# Patient Record
Sex: Male | Born: 1965 | Race: White | Hispanic: No | Marital: Married | Smoking: Former smoker
Health system: Southern US, Community
[De-identification: ages and names within clinical notes are randomized; demographics above are authoritative.]

## PROBLEM LIST (undated history)

## (undated) DIAGNOSIS — E7800 Pure hypercholesterolemia, unspecified: Secondary | ICD-10-CM

## (undated) DIAGNOSIS — B1920 Unspecified viral hepatitis C without hepatic coma: Secondary | ICD-10-CM

## (undated) DIAGNOSIS — M1990 Unspecified osteoarthritis, unspecified site: Secondary | ICD-10-CM

## (undated) DIAGNOSIS — I219 Acute myocardial infarction, unspecified: Secondary | ICD-10-CM

## (undated) DIAGNOSIS — R9430 Abnormal result of cardiovascular function study, unspecified: Secondary | ICD-10-CM

## (undated) DIAGNOSIS — F32A Depression, unspecified: Secondary | ICD-10-CM

## (undated) DIAGNOSIS — I509 Heart failure, unspecified: Secondary | ICD-10-CM

## (undated) DIAGNOSIS — IMO0002 Reserved for concepts with insufficient information to code with codable children: Secondary | ICD-10-CM

## (undated) DIAGNOSIS — Z87442 Personal history of urinary calculi: Secondary | ICD-10-CM

## (undated) DIAGNOSIS — R911 Solitary pulmonary nodule: Secondary | ICD-10-CM

## (undated) DIAGNOSIS — F1411 Cocaine abuse, in remission: Secondary | ICD-10-CM

## (undated) DIAGNOSIS — I10 Essential (primary) hypertension: Secondary | ICD-10-CM

## (undated) DIAGNOSIS — I779 Disorder of arteries and arterioles, unspecified: Secondary | ICD-10-CM

## (undated) DIAGNOSIS — I2510 Atherosclerotic heart disease of native coronary artery without angina pectoris: Secondary | ICD-10-CM

## (undated) DIAGNOSIS — F1011 Alcohol abuse, in remission: Secondary | ICD-10-CM

## (undated) DIAGNOSIS — K219 Gastro-esophageal reflux disease without esophagitis: Secondary | ICD-10-CM

## (undated) DIAGNOSIS — R42 Dizziness and giddiness: Secondary | ICD-10-CM

## (undated) DIAGNOSIS — E119 Type 2 diabetes mellitus without complications: Secondary | ICD-10-CM

## (undated) DIAGNOSIS — K602 Anal fissure, unspecified: Secondary | ICD-10-CM

## (undated) DIAGNOSIS — J45909 Unspecified asthma, uncomplicated: Secondary | ICD-10-CM

## (undated) DIAGNOSIS — I739 Peripheral vascular disease, unspecified: Secondary | ICD-10-CM

## (undated) DIAGNOSIS — F329 Major depressive disorder, single episode, unspecified: Secondary | ICD-10-CM

## (undated) HISTORY — DX: Dizziness and giddiness: R42

## (undated) HISTORY — DX: Essential (primary) hypertension: I10

## (undated) HISTORY — PX: CARDIAC CATHETERIZATION: SHX172

## (undated) HISTORY — DX: Cocaine abuse, in remission: F14.11

## (undated) HISTORY — DX: Disorder of arteries and arterioles, unspecified: I77.9

## (undated) HISTORY — DX: Solitary pulmonary nodule: R91.1

## (undated) HISTORY — DX: Heart failure, unspecified: I50.9

## (undated) HISTORY — DX: Atherosclerotic heart disease of native coronary artery without angina pectoris: I25.10

## (undated) HISTORY — DX: Alcohol abuse, in remission: F10.11

## (undated) HISTORY — DX: Unspecified viral hepatitis C without hepatic coma: B19.20

## (undated) HISTORY — DX: Pure hypercholesterolemia, unspecified: E78.00

## (undated) HISTORY — DX: Major depressive disorder, single episode, unspecified: F32.9

## (undated) HISTORY — DX: Reserved for concepts with insufficient information to code with codable children: IMO0002

## (undated) HISTORY — DX: Depression, unspecified: F32.A

## (undated) HISTORY — PX: OTHER SURGICAL HISTORY: SHX169

## (undated) HISTORY — DX: Peripheral vascular disease, unspecified: I73.9

## (undated) HISTORY — DX: Abnormal result of cardiovascular function study, unspecified: R94.30

## (undated) HISTORY — DX: Anal fissure, unspecified: K60.2

---

## 2001-11-19 ENCOUNTER — Emergency Department (HOSPITAL_COMMUNITY): Admission: EM | Admit: 2001-11-19 | Discharge: 2001-11-19 | Payer: Self-pay | Admitting: Emergency Medicine

## 2003-02-06 ENCOUNTER — Emergency Department (HOSPITAL_COMMUNITY): Admission: EM | Admit: 2003-02-06 | Discharge: 2003-02-06 | Payer: Self-pay | Admitting: Internal Medicine

## 2004-09-24 ENCOUNTER — Ambulatory Visit: Payer: Self-pay | Admitting: Internal Medicine

## 2004-09-24 ENCOUNTER — Ambulatory Visit (HOSPITAL_COMMUNITY): Admission: RE | Admit: 2004-09-24 | Discharge: 2004-09-24 | Payer: Self-pay | Admitting: Internal Medicine

## 2004-10-08 ENCOUNTER — Ambulatory Visit: Payer: Self-pay | Admitting: Gastroenterology

## 2004-10-08 ENCOUNTER — Ambulatory Visit: Payer: Self-pay | Admitting: Internal Medicine

## 2005-01-01 ENCOUNTER — Ambulatory Visit: Payer: Self-pay | Admitting: Internal Medicine

## 2005-04-16 ENCOUNTER — Ambulatory Visit: Payer: Self-pay | Admitting: Internal Medicine

## 2005-05-05 ENCOUNTER — Ambulatory Visit: Payer: Self-pay | Admitting: Internal Medicine

## 2005-09-21 ENCOUNTER — Ambulatory Visit: Payer: Self-pay | Admitting: Internal Medicine

## 2006-03-22 ENCOUNTER — Ambulatory Visit: Payer: Self-pay | Admitting: Internal Medicine

## 2006-03-31 ENCOUNTER — Ambulatory Visit (HOSPITAL_COMMUNITY): Admission: RE | Admit: 2006-03-31 | Discharge: 2006-03-31 | Payer: Self-pay | Admitting: Internal Medicine

## 2006-04-08 ENCOUNTER — Ambulatory Visit: Payer: Self-pay | Admitting: Gastroenterology

## 2006-04-19 ENCOUNTER — Ambulatory Visit: Payer: Self-pay | Admitting: Internal Medicine

## 2006-08-24 ENCOUNTER — Ambulatory Visit: Payer: Self-pay | Admitting: Internal Medicine

## 2006-08-24 ENCOUNTER — Ambulatory Visit (HOSPITAL_COMMUNITY): Admission: RE | Admit: 2006-08-24 | Discharge: 2006-08-24 | Payer: Self-pay | Admitting: Internal Medicine

## 2006-08-24 ENCOUNTER — Encounter (INDEPENDENT_AMBULATORY_CARE_PROVIDER_SITE_OTHER): Payer: Self-pay | Admitting: Internal Medicine

## 2006-08-24 LAB — CONVERTED CEMR LAB: TSH: 2.61 microintl units/mL

## 2006-09-09 ENCOUNTER — Ambulatory Visit: Payer: Self-pay | Admitting: Gastroenterology

## 2006-09-28 ENCOUNTER — Ambulatory Visit: Payer: Self-pay | Admitting: Internal Medicine

## 2006-10-25 ENCOUNTER — Ambulatory Visit: Payer: Self-pay | Admitting: *Deleted

## 2006-11-01 ENCOUNTER — Ambulatory Visit: Payer: Self-pay | Admitting: Gastroenterology

## 2006-12-15 ENCOUNTER — Ambulatory Visit: Payer: Self-pay | Admitting: Internal Medicine

## 2006-12-15 ENCOUNTER — Encounter (INDEPENDENT_AMBULATORY_CARE_PROVIDER_SITE_OTHER): Payer: Self-pay | Admitting: Internal Medicine

## 2006-12-15 LAB — CONVERTED CEMR LAB
ALT: 65 units/L — ABNORMAL HIGH (ref 0–53)
AST: 31 units/L (ref 0–37)
Albumin: 4.4 g/dL (ref 3.5–5.2)
Alkaline Phosphatase: 60 units/L (ref 39–117)
BUN: 12 mg/dL (ref 6–23)
CO2: 28 meq/L (ref 19–32)
Calcium: 9.6 mg/dL (ref 8.4–10.5)
Chloride: 100 meq/L (ref 96–112)
Cholesterol: 164 mg/dL (ref 0–200)
Creatinine, Ser: 1 mg/dL (ref 0.40–1.50)
Glucose, Bld: 101 mg/dL — ABNORMAL HIGH (ref 70–99)
HDL: 27 mg/dL — ABNORMAL LOW (ref >39)
LDL Cholesterol: 115 mg/dL — ABNORMAL HIGH (ref 0–99)
Potassium: 4.3 meq/L (ref 3.5–5.3)
Sodium: 138 meq/L (ref 135–145)
Total Bilirubin: 0.7 mg/dL (ref 0.3–1.2)
Total CHOL/HDL Ratio: 6.1
Total Protein: 7.4 g/dL (ref 6.0–8.3)
Triglycerides: 112 mg/dL (ref <150)
VLDL: 22 mg/dL (ref 0–40)

## 2006-12-19 ENCOUNTER — Encounter (INDEPENDENT_AMBULATORY_CARE_PROVIDER_SITE_OTHER): Payer: Self-pay | Admitting: Internal Medicine

## 2006-12-19 DIAGNOSIS — I10 Essential (primary) hypertension: Secondary | ICD-10-CM

## 2006-12-19 DIAGNOSIS — F329 Major depressive disorder, single episode, unspecified: Secondary | ICD-10-CM

## 2006-12-19 DIAGNOSIS — F1011 Alcohol abuse, in remission: Secondary | ICD-10-CM

## 2006-12-19 DIAGNOSIS — F1411 Cocaine abuse, in remission: Secondary | ICD-10-CM

## 2006-12-19 DIAGNOSIS — B182 Chronic viral hepatitis C: Secondary | ICD-10-CM

## 2006-12-27 ENCOUNTER — Encounter (INDEPENDENT_AMBULATORY_CARE_PROVIDER_SITE_OTHER): Payer: Self-pay | Admitting: Specialist

## 2006-12-27 ENCOUNTER — Ambulatory Visit (HOSPITAL_COMMUNITY): Admission: RE | Admit: 2006-12-27 | Discharge: 2006-12-27 | Payer: Self-pay | Admitting: Gastroenterology

## 2006-12-28 ENCOUNTER — Telehealth: Payer: Self-pay | Admitting: *Deleted

## 2007-02-21 ENCOUNTER — Ambulatory Visit: Payer: Self-pay | Admitting: Internal Medicine

## 2007-02-21 DIAGNOSIS — R1013 Epigastric pain: Secondary | ICD-10-CM

## 2007-02-21 DIAGNOSIS — F17200 Nicotine dependence, unspecified, uncomplicated: Secondary | ICD-10-CM

## 2007-02-21 DIAGNOSIS — K3189 Other diseases of stomach and duodenum: Secondary | ICD-10-CM

## 2007-04-11 ENCOUNTER — Ambulatory Visit: Payer: Self-pay | Admitting: Gastroenterology

## 2007-04-12 ENCOUNTER — Encounter (INDEPENDENT_AMBULATORY_CARE_PROVIDER_SITE_OTHER): Payer: Self-pay | Admitting: Internal Medicine

## 2007-04-12 ENCOUNTER — Ambulatory Visit: Payer: Self-pay | Admitting: Hospitalist

## 2007-04-12 DIAGNOSIS — R300 Dysuria: Secondary | ICD-10-CM

## 2007-04-12 DIAGNOSIS — L851 Acquired keratosis [keratoderma] palmaris et plantaris: Secondary | ICD-10-CM | POA: Insufficient documentation

## 2007-04-12 LAB — CONVERTED CEMR LAB
Bilirubin Urine: NEGATIVE
Bilirubin Urine: NEGATIVE
Blood in Urine, dipstick: NEGATIVE
Chlamydia, Swab/Urine, PCR: NEGATIVE
GC Probe Amp, Urine: NEGATIVE
Glucose, Urine, Semiquant: NEGATIVE
Hemoglobin, Urine: NEGATIVE
Ketones, ur: NEGATIVE mg/dL
Ketones, urine, test strip: NEGATIVE
Leukocytes, UA: NEGATIVE
Nitrite: NEGATIVE
Nitrite: NEGATIVE
Protein, U semiquant: NEGATIVE
Protein, ur: NEGATIVE mg/dL
Specific Gravity, Urine: <1.005
Specific Gravity, Urine: 1.008 (ref 1.005–1.03)
Urine Glucose: NEGATIVE mg/dL
Urobilinogen, UA: 0.2
Urobilinogen, UA: 0.2 (ref 0.0–1.0)
WBC Urine, dipstick: NEGATIVE
pH: 5
pH: 5.5 (ref 5.0–8.0)

## 2007-04-13 ENCOUNTER — Telehealth (INDEPENDENT_AMBULATORY_CARE_PROVIDER_SITE_OTHER): Payer: Self-pay | Admitting: *Deleted

## 2007-04-19 ENCOUNTER — Encounter (INDEPENDENT_AMBULATORY_CARE_PROVIDER_SITE_OTHER): Payer: Self-pay | Admitting: Internal Medicine

## 2007-04-19 ENCOUNTER — Ambulatory Visit: Payer: Self-pay | Admitting: Internal Medicine

## 2007-04-19 ENCOUNTER — Other Ambulatory Visit: Admission: RE | Admit: 2007-04-19 | Discharge: 2007-04-19 | Payer: Self-pay | Admitting: Internal Medicine

## 2007-04-20 ENCOUNTER — Encounter: Payer: Self-pay | Admitting: Internal Medicine

## 2007-04-21 DIAGNOSIS — B079 Viral wart, unspecified: Secondary | ICD-10-CM | POA: Insufficient documentation

## 2007-04-26 ENCOUNTER — Ambulatory Visit: Payer: Self-pay | Admitting: Internal Medicine

## 2007-04-26 ENCOUNTER — Encounter (INDEPENDENT_AMBULATORY_CARE_PROVIDER_SITE_OTHER): Payer: Self-pay | Admitting: Dermatology

## 2007-05-02 ENCOUNTER — Ambulatory Visit: Payer: Self-pay | Admitting: Gastroenterology

## 2007-07-28 ENCOUNTER — Ambulatory Visit: Payer: Self-pay | Admitting: Hospitalist

## 2007-07-28 ENCOUNTER — Telehealth (INDEPENDENT_AMBULATORY_CARE_PROVIDER_SITE_OTHER): Payer: Self-pay | Admitting: *Deleted

## 2007-07-28 DIAGNOSIS — M25559 Pain in unspecified hip: Secondary | ICD-10-CM

## 2007-08-10 ENCOUNTER — Ambulatory Visit: Payer: Self-pay | Admitting: Gastroenterology

## 2007-08-24 ENCOUNTER — Ambulatory Visit (HOSPITAL_COMMUNITY): Admission: RE | Admit: 2007-08-24 | Discharge: 2007-08-24 | Payer: Self-pay | Admitting: Obstetrics and Gynecology

## 2007-08-24 ENCOUNTER — Ambulatory Visit: Payer: Self-pay | Admitting: Gastroenterology

## 2007-09-01 ENCOUNTER — Encounter: Payer: Self-pay | Admitting: Pulmonary Disease

## 2007-09-01 ENCOUNTER — Ambulatory Visit (HOSPITAL_COMMUNITY): Admission: RE | Admit: 2007-09-01 | Discharge: 2007-09-01 | Payer: Self-pay | Admitting: Gastroenterology

## 2007-09-07 ENCOUNTER — Ambulatory Visit: Payer: Self-pay | Admitting: Gastroenterology

## 2007-09-08 ENCOUNTER — Ambulatory Visit: Payer: Self-pay | Admitting: Pulmonary Disease

## 2007-09-08 DIAGNOSIS — J984 Other disorders of lung: Secondary | ICD-10-CM | POA: Insufficient documentation

## 2007-09-21 ENCOUNTER — Ambulatory Visit: Payer: Self-pay | Admitting: Pulmonary Disease

## 2007-09-21 ENCOUNTER — Ambulatory Visit: Payer: Self-pay | Admitting: Gastroenterology

## 2007-10-02 ENCOUNTER — Ambulatory Visit: Payer: Self-pay | Admitting: Hospitalist

## 2007-10-24 ENCOUNTER — Ambulatory Visit: Payer: Self-pay | Admitting: Gastroenterology

## 2007-11-14 ENCOUNTER — Ambulatory Visit: Payer: Self-pay | Admitting: Gastroenterology

## 2007-11-28 ENCOUNTER — Ambulatory Visit: Payer: Self-pay | Admitting: Pulmonary Disease

## 2007-11-28 DIAGNOSIS — R930 Abnormal findings on diagnostic imaging of skull and head, not elsewhere classified: Secondary | ICD-10-CM

## 2007-12-04 ENCOUNTER — Ambulatory Visit: Payer: Self-pay | Admitting: Gastroenterology

## 2007-12-07 ENCOUNTER — Ambulatory Visit (HOSPITAL_COMMUNITY): Admission: RE | Admit: 2007-12-07 | Discharge: 2007-12-07 | Payer: Self-pay | Admitting: Pulmonary Disease

## 2007-12-07 ENCOUNTER — Encounter: Payer: Self-pay | Admitting: Pulmonary Disease

## 2007-12-14 ENCOUNTER — Ambulatory Visit: Payer: Self-pay | Admitting: Gastroenterology

## 2008-01-04 ENCOUNTER — Ambulatory Visit: Payer: Self-pay | Admitting: Gastroenterology

## 2008-01-10 ENCOUNTER — Ambulatory Visit: Payer: Self-pay | Admitting: Gastroenterology

## 2008-01-10 DIAGNOSIS — K602 Anal fissure, unspecified: Secondary | ICD-10-CM | POA: Insufficient documentation

## 2008-01-11 ENCOUNTER — Ambulatory Visit: Payer: Self-pay | Admitting: Internal Medicine

## 2008-01-11 DIAGNOSIS — E785 Hyperlipidemia, unspecified: Secondary | ICD-10-CM

## 2008-01-11 DIAGNOSIS — H9319 Tinnitus, unspecified ear: Secondary | ICD-10-CM | POA: Insufficient documentation

## 2008-01-18 ENCOUNTER — Ambulatory Visit: Payer: Self-pay | Admitting: Gastroenterology

## 2008-01-18 ENCOUNTER — Encounter: Payer: Self-pay | Admitting: Gastroenterology

## 2008-02-01 ENCOUNTER — Ambulatory Visit: Payer: Self-pay | Admitting: Gastroenterology

## 2008-02-29 ENCOUNTER — Ambulatory Visit: Payer: Self-pay | Admitting: Gastroenterology

## 2008-04-04 ENCOUNTER — Ambulatory Visit: Payer: Self-pay | Admitting: Gastroenterology

## 2008-05-02 ENCOUNTER — Ambulatory Visit: Payer: Self-pay | Admitting: Gastroenterology

## 2008-05-14 ENCOUNTER — Ambulatory Visit: Payer: Self-pay | Admitting: Pulmonary Disease

## 2008-05-30 ENCOUNTER — Ambulatory Visit: Payer: Self-pay | Admitting: Gastroenterology

## 2008-06-27 ENCOUNTER — Ambulatory Visit: Payer: Self-pay | Admitting: Gastroenterology

## 2008-07-21 ENCOUNTER — Emergency Department (HOSPITAL_COMMUNITY): Admission: EM | Admit: 2008-07-21 | Discharge: 2008-07-21 | Payer: Self-pay | Admitting: Emergency Medicine

## 2008-08-01 ENCOUNTER — Ambulatory Visit: Payer: Self-pay | Admitting: Gastroenterology

## 2008-08-13 ENCOUNTER — Ambulatory Visit: Payer: Self-pay | Admitting: Pulmonary Disease

## 2008-08-13 ENCOUNTER — Ambulatory Visit: Payer: Self-pay | Admitting: Cardiology

## 2008-10-03 ENCOUNTER — Ambulatory Visit: Payer: Self-pay | Admitting: Gastroenterology

## 2009-07-31 ENCOUNTER — Ambulatory Visit: Payer: Self-pay | Admitting: Gastroenterology

## 2009-09-16 ENCOUNTER — Ambulatory Visit (HOSPITAL_COMMUNITY): Admission: RE | Admit: 2009-09-16 | Discharge: 2009-09-16 | Payer: Self-pay | Admitting: Optometry

## 2010-10-02 ENCOUNTER — Encounter: Payer: Self-pay | Admitting: Physician Assistant

## 2010-10-18 ENCOUNTER — Inpatient Hospital Stay (HOSPITAL_COMMUNITY)
Admission: EM | Admit: 2010-10-18 | Discharge: 2010-10-22 | Payer: Self-pay | Source: Home / Self Care | Admitting: Emergency Medicine

## 2010-10-18 ENCOUNTER — Ambulatory Visit: Payer: Self-pay | Admitting: Cardiovascular Disease

## 2010-10-18 ENCOUNTER — Encounter: Payer: Self-pay | Admitting: Physician Assistant

## 2010-10-19 ENCOUNTER — Encounter: Payer: Self-pay | Admitting: Cardiovascular Disease

## 2010-10-19 ENCOUNTER — Encounter: Payer: Self-pay | Admitting: Physician Assistant

## 2010-10-20 ENCOUNTER — Encounter: Payer: Self-pay | Admitting: Physician Assistant

## 2010-10-21 ENCOUNTER — Encounter: Payer: Self-pay | Admitting: Physician Assistant

## 2010-11-04 ENCOUNTER — Ambulatory Visit: Payer: Self-pay | Admitting: Physician Assistant

## 2010-11-04 ENCOUNTER — Encounter: Payer: Self-pay | Admitting: Physician Assistant

## 2010-11-11 ENCOUNTER — Ambulatory Visit: Payer: Self-pay | Admitting: Cardiovascular Disease

## 2010-12-02 ENCOUNTER — Ambulatory Visit
Admission: RE | Admit: 2010-12-02 | Discharge: 2010-12-02 | Payer: Self-pay | Source: Home / Self Care | Attending: Internal Medicine | Admitting: Internal Medicine

## 2010-12-24 NOTE — Assessment & Plan Note (Signed)
Summary: POST MI f/u per Dr Myrtis Ser  Medications Added FAMOTIDINE 20 MG TABS (FAMOTIDINE) 1 by mouth two times a day HYDROCORTISONE 2.5 % CREA (HYDROCORTISONE) apply as directed      Allergies Added: NKDA  Visit Type:  Hospital Follow-up Primary Provider:  Hasanaj  CC:  shortness of breath.  History of Present Illness: patient presents for post hospital followup, following recent presentation at [facility] with acute inferior STEMI. He was transferred emergently from [facility] ED, directly to the cardiac catheterization lab, following consultation with Dr. Tonny Bollman.  He was found to have subtotal occlusion of the mid left circumflex artery, successfully treated with a single BMS. Residual anatomy notable for moderate LAD disease, normal RCA, and EF 40-45%, with mild MR.  Of note, LV function was also assessed by cardiac MRI (EF 31%), and 2-D echo (EF 30%). Because of severe post-MI LV dysfunction, a LifeVest was placed.  He reports few fleeting episodes of chest pain, but these pains are not like those at the time of his MI. He complains of shortness of breath with exertion. No edema. No other complaints.  Current Medications (verified): 1)  Budeprion Xl 300 Mg Xr24h-Tab (Bupropion Hcl) .... Take 1 Tablet By Mouth Once A Day 2)  Doxepin Hcl 75 Mg Caps (Doxepin Hcl) .... Take 1 Tablet By Mouth Once A Day 3)  Tylenol 325 Mg Tabs (Acetaminophen) .... Take 2 By Mouth Every 4 Hours As Needed 4)  Lisinopril 10 Mg Tabs (Lisinopril) .... Take 1 Tablet By Mouth Once A Day 5)  Celexa 20 Mg Tabs (Citalopram Hydrobromide) .... Take 1 Tablet By Mouth Once A Day 6)  Abilify 5 Mg Tabs (Aripiprazole) .... Take 1 Tablet By Mouth Once A Day 7)  Clonazepam 0.5 Mg Tabs (Clonazepam) .... Take 1 Tablet By Mouth Three Times A Day 8)  Carvedilol 12.5 Mg Tabs (Carvedilol) .... Take 1 Tablet By Mouth Two Times A Day 9)  Lipitor 80 Mg Tabs (Atorvastatin Calcium) .... Take 1 Tablet By Mouth Once A Day 10)   Nitrostat 0.4 Mg Subl (Nitroglycerin) .... Use As Directed 11)  Aspirin 325 Mg Tabs (Aspirin) .... Take 1 Tablet By Mouth Once A Day 12)  Plavix 75 Mg Tabs (Clopidogrel Bisulfate) .... Take 1 Tablet By Mouth Once A Day 13)  Famotidine 20 Mg Tabs (Famotidine) .Marland Kitchen.. 1 By Mouth Two Times A Day  Allergies (verified): No Known Drug Allergies  Past History:  Past medical history reviewed for relevance to current acute and chronic problems.  Past Medical History: Reviewed history from 01/10/2008 and no changes required. Depression Hepatitis C Hypertension hx/o Cocaine abuse in 80s, ex-IVDU hx/o Alcohol abuse Pulmonary Nodule Anal Fissure  Review of Systems       Negative except as per HPI   Vital Signs:  Patient profile:   45 year old male Height:      69 inches Weight:      230 pounds BMI:     34.09 Pulse rate:   70 / minute Resp:     16 per minute BP sitting:   128 / 87  (right arm)  Vitals Entered By: Marrion Coy, CNA (November 11, 2010 1:42 PM)  Physical Exam  General:  Pt is alert and oriented, obese male, in no acute distress. HEENT: normal Neck: normal carotid upstrokes without bruits, JVP normal Lungs: CTA CV: RRR without murmur or gallop Chest: rash at site of LifeVest contact on anterior and posterior chest Abd: soft, NT, positive BS, no  bruit, no organomegaly Ext: no clubbing, cyanosis, or edema. peripheral pulses 2+ and equal Skin: warm and dry without rash    Impression & Recommendations:  Problem # 1:  MYOCARDIAL INFARCTION, INFEROPOSTERIOR WALL, INITIAL EPISODE (ICD-410.31) Pt is s/p MI secondary to left circumflex occlusion and treatment with primary PCI (bare metal stent). A LifeVest was placed because his post-MI LVEF is 30% (by both echo and cardiac MR). The MRI suggested that the degree of LV dysfunction is out-of-proportion to infarct size. 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 He has an appt with  Dr Johney Frame Jan 11th for consideration of an ICD. He will need reassessment of LV function in the near future and I'll leave the timing of this to Dr Johney Frame.  He is on appropriate medical therapy and will continue this without changes today. Ongoing followup will be with Dr Myrtis Ser in the [place] office.  His updated medication list for this problem includes:    Lisinopril 10 Mg Tabs (Lisinopril) .Marland Kitchen... Take 1 tablet by mouth once a day    Carvedilol 12.5 Mg Tabs (Carvedilol) .Marland Kitchen... Take 1 tablet by mouth two times a day    Nitrostat 0.4 Mg Subl (Nitroglycerin) ..... Use as directed    Aspirin 325 Mg Tabs (Aspirin) .Marland Kitchen... Take 1 tablet by mouth once a day    Plavix 75 Mg Tabs (Clopidogrel bisulfate) .Marland Kitchen... Take 1 tablet by mouth once a day  Other Orders: EKG w/ Interpretation (93000)  Patient Instructions: 1)  Your physician recommends that you keep your scheduled follow-up appointment with Dr Johney Frame. 2)  Your physician recommends that you continue on your current medications as directed. Please refer to the Current Medication list given to you today. 3)  Please use 2.5% Hydrocortisone cream and 100% corn starch on rash.  Prescriptions: HYDROCORTISONE 2.5 % CREA (HYDROCORTISONE) apply as directed  #1 x 1   Entered by:   Julieta Gutting, RN, BSN   Authorized by:   Norva Karvonen, MD   Signed by:   Julieta Gutting, RN, BSN on 11/11/2010   Method used:   Electronically to        [facility] (retail)       [address]       [place], [place]  [address]       Ph: [phone]       Fax: [phone]   RxID:   [id number]

## 2010-12-24 NOTE — Assessment & Plan Note (Signed)
Summary: eph-post cone      Allergies Added: NKDA  Visit Type:  Follow-up Primary Provider:  Hasanaj   History of Present Illness: patient presents for post hospital followup, following recent presentation at [facility] with acute inferior STEMI. He was transferred emergently from [facility] ED, directly to the cardiac catheterization lab, following consultation with Dr. Tonny Bollman.  He was found to have subtotal occlusion of the mid left circumflex artery, successfully treated with a single BMS. Residual anatomy notable for moderate LAD disease, normal RCA, and EF 40-45%, with mild MR.  Records indicate that he was treated for tachycardia, and was subsequently placed on a LifeVest. Beta blocker was added. Electrolytes were within normal limits. He is due for a repeat 2-D echo, in 2 months.  Of note, LV function was also assessed by cardiac MRI (EF 31%), and 2-D echo (EF 30%).  Clinically, he denies recurrent chest and left bicep pain. He has since stopped smoking tobacco. He complains of a non pruritic rash, beneath his anterior LifeVest strap.  Preventive Screening-Counseling & Management  Alcohol-Tobacco     Smoking Status: quit     Year Quit: 10/18/10  Current Medications (verified): 1)  Budeprion Xl 300 Mg Xr24h-Tab (Bupropion Hcl) .... Take 1 Tablet By Mouth Once A Day 2)  Doxepin Hcl 75 Mg Caps (Doxepin Hcl) .... Take 1 Tablet By Mouth Once A Day 3)  Tylenol 325 Mg Tabs (Acetaminophen) .... Take 2 By Mouth Every 4 Hours As Needed 4)  Lisinopril 10 Mg Tabs (Lisinopril) .... Take 1 Tablet By Mouth Once A Day 5)  Celexa 20 Mg Tabs (Citalopram Hydrobromide) .... Take 1 Tablet By Mouth Once A Day 6)  Abilify 5 Mg Tabs (Aripiprazole) .... Take 1 Tablet By Mouth Once A Day 7)  Clonazepam 0.5 Mg Tabs (Clonazepam) .... Take 1 Tablet By Mouth Three Times A Day 8)  Carvedilol 12.5 Mg Tabs (Carvedilol) .... Take 1 Tablet By Mouth Two Times A Day 9)  Lipitor 80 Mg Tabs (Atorvastatin Calcium)  .... Take 1 Tablet By Mouth Once A Day 10)  Nitrostat 0.4 Mg Subl (Nitroglycerin) .... Use As Directed 11)  Aspirin 325 Mg Tabs (Aspirin) .... Take 1 Tablet By Mouth Once A Day 12)  Plavix 75 Mg Tabs (Clopidogrel Bisulfate) .... Take 1 Tablet By Mouth Once A Day  Allergies (verified): No Known Drug Allergies  Comments:  Nurse/Medical Assistant: The patient's medication list and allergies were reviewed with the patient and were updated in the Medication and Allergy Lists.  Past History:  Past Medical History: Last updated: 01/10/2008 Depression Hepatitis C Hypertension hx/o Cocaine abuse in 80s, ex-IVDU hx/o Alcohol abuse Pulmonary Nodule Anal Fissure  Social History: Smoking Status:  quit  Review of Systems       No fevers, chills, hemoptysis, dysphagia, melena, hematocheezia, hematuria, rash, claudication, orthopnea, pnd, pedal edema. denies tachycardia palpitations, or defibrillator shock. All other systems negative.   Vital Signs:  Patient profile:   45 year old male Height:      69 inches Weight:      232 pounds BMI:     34.38 Pulse rate:   73 / minute BP sitting:   118 / 84  (left arm) Cuff size:   large  Vitals Entered By: Carlye Grippe (November 04, 2010 1:32 PM)  Nutrition Counseling: Patient's BMI is greater than 25 and therefore counseled on weight management options.   Physical Exam  Additional Exam:  GEN: 80 -year-old male, obese, no distress HEENT:  NCAT,PERRLA,EOMI NECK: palpable pulses, no bruits; no JVD; no TM LUNGS: CTA bilaterally HEART: RRR (S1S2); no significant murmurs; no rubs; no gallops ABD: protuberant; intact BS EXT: stable right wrist incision site, with no hematoma or ecchymosis; no peripheral edema SKIN: small area of petechial rash, underlying strap MUSC: no obvious deformity NEURO: A/O (x3)     Impression & Recommendations:  Problem # 1:  CAD (ICD-414.00)  patient doing well, following recent presentation with acute  myocardial infarction. He has not had any recurrent chest or left arm pain. He has since stopped smoking tobacco, and reports compliance with his medications. Of note, patient is wearing a LifeVest, and is scheduled for a followup 2-D echo in approximately 2 months, for reassessment of LV function, in our [place] office. I discussed his recent presentation with Dr. Willa Rough, with whom patient will establish here in [place], with particular emphasis on his LifeVest. Dr. Henrietta Hoover recommendation is that patient be initially seen in early followup by Dr. Tonny Bollman, as well as by our EP team regarding duration and further recommendations with respect to his LifeVest. Of note, patient is on a beta blocker. Following stabilization and resolution of these issues, we'll have patient return to our [facility] clinic, for follow up with Dr. Myrtis Ser.  Problem # 2:  DYSLIPIDEMIA (ICD-272.4)  patient recently placed on high-dose Lipitor. Will assess lipid status with repeat profile in 12 weeks.  Problem # 3:  TOBACCO USE (ICD-305.1)  the patient has since quit smoking.  Problem # 4:  HYPERTENSION (ICD-401.9)  stable on current medication regimen.  Problem # 5:  elevated TSH  Will repeat TSH level, recently assessed as 8.2. Further workup and management will be deferred to Dr. Olena Leatherwood. Will check a followup metabolic profile at that time, as well.  Other Orders: EKG w/ Interpretation (93000) T-Basic Metabolic Panel ([address]) T-TSH [phone])  Patient Instructions: 1)  Follow up with Dr. Myrtis Ser on Monday, January 24, 2010 at 1pm. 2)  We will be in touch with your appt information to see Dr. Excell Seltzer and EP in [place].

## 2010-12-24 NOTE — Assessment & Plan Note (Signed)
Summary: nep/pt has a life vest      Allergies Added: NKDA  Visit Type:  Initial Consult Referring Provider:  Dr Excell Seltzer Primary Provider:  Olena Leatherwood   History of Present Illness: Mr Angel Donovan is a pleasant 45 yo WM with a h/o CAD s/p recent inferior MI Oct 30, 2010 who presents today for further risk stratification of sudden death.    On 2010/10/30, he was transferred emergently from [facility] ED, directly to the [facility] cardiac catheterization lab.  He was found to have subtotal occlusion of the mid left circumflex artery which was successfully treated with a single BMS. Residual anatomy was notable for moderate LAD disease, normal RCA, and EF 40-45%, with mild MR.  His  LV function was also assessed by cardiac MRI (EF 31%), and 2-D echo (EF 30%). Because of severe post-MI LV dysfunction, a LifeVest was placed. The patient reports doing very well since that time.  He has been compliant with medicine therapy.  He has also quit smoking.  He has not been very active and has not started cardiac rehab. He denies further chest pain but reports SOB with moderate activity. He denies orthopnea, PND, lower extremity edema, dizziness, presyncope, syncope, or neurologic sequela. The patient is tolerating medications without difficulties and is otherwise without complaint today.   He reports compliance with his lifevest and has not received shock therapy.  Current Medications (verified): 1)  Budeprion Xl 300 Mg Xr24h-Tab (Bupropion Hcl) .... Take 1 Tablet By Mouth Once A Day 2)  Doxepin Hcl 75 Mg Caps (Doxepin Hcl) .... Take 1 Tablet By Mouth Once A Day 3)  Tylenol 325 Mg Tabs (Acetaminophen) .... Take 2 By Mouth Every 4 Hours As Needed 4)  Lisinopril 10 Mg Tabs (Lisinopril) .... Take 1 Tablet By Mouth Once A Day 5)  Celexa 20 Mg Tabs (Citalopram Hydrobromide) .... Take 1 Tablet By Mouth Once A Day 6)  Abilify 5 Mg Tabs (Aripiprazole) .... Take 1 Tablet By Mouth Once A Day 7)  Clonazepam 0.5 Mg Tabs (Clonazepam)  .... Take 1 Tablet By Mouth Three Times A Day 8)  Carvedilol 12.5 Mg Tabs (Carvedilol) .... Take 1 Tablet By Mouth Two Times A Day 9)  Lipitor 80 Mg Tabs (Atorvastatin Calcium) .... Take 1 Tablet By Mouth Once A Day 10)  Nitrostat 0.4 Mg Subl (Nitroglycerin) .... Use As Directed 11)  Aspirin 325 Mg Tabs (Aspirin) .... Take 1 Tablet By Mouth Once A Day 12)  Plavix 75 Mg Tabs (Clopidogrel Bisulfate) .... Take 1 Tablet By Mouth Once A Day 13)  Famotidine 20 Mg Tabs (Famotidine) .Marland Kitchen.. 1 By Mouth Two Times A Day 14)  Hydrocortisone 2.5 % Crea (Hydrocortisone) .... Apply As Directed  Allergies (verified): No Known Drug Allergies  Past History:  Past Medical History: CAD s/p acure inferior MI 10-30-10 requiring PCI of the RCA Ischemic CM (EF 30%) NYHA Class II/III CHF Depression Hepatitis C Hypertension hx/o Cocaine abuse in 80s, ex-IVDU hx/o Alcohol abuse Pulmonary Nodule Anal Fissure  Past Surgical History: cath with PCI RCA 10/19/10  Family History: Reviewed history from 12/19/2006 and no changes required. Family History Diabetes 1st degree relative Family History of CAD Male 1st degree relative <50,and repeat MI in May40 Family History Other cancer-Lymphoma Father  Social History: Pt lives in [place].  He smoked previously but quit with his MI 11/11. Alcohol- quit denies drug use Married  Review of Systems       All systems are reviewed and negative except as listed  in the HPI.   Vital Signs:  Patient profile:   45 year old male Height:      69 inches Weight:      234 pounds BMI:     34.68 Pulse rate:   72 / minute BP sitting:   122 / 78  (left arm)  Vitals Entered By: Laurance Flatten CMA (December 02, 2010 12:11 PM)  Physical Exam  General:  Well developed, well nourished, in no acute distress.  wearing lifevest today Head:  normocephalic and atraumatic Eyes:  PERRLA/EOM intact; conjunctiva and lids normal. Mouth:  Teeth, gums and palate normal. Oral mucosa  normal. Neck:  Neck supple, no JVD. No masses, thyromegaly or abnormal cervical nodes. Lungs:  Clear bilaterally to auscultation and percussion. Heart:  normal rate, regular rhythm, no murmur, no gallop, and no rub.   Abdomen:  Bowel sounds positive; abdomen soft and non-tender without masses, organomegaly, or hernias noted. No hepatosplenomegaly. Msk:  Back normal, normal gait. Muscle strength and tone normal. Extremities:  No clubbing or cyanosis. Neurologic:  Alert and oriented x 3. Skin:  Intact without lesions or rashes. Psych:  Normal affect.  Cardiac Cath  Procedure date:  10/18/2010  Findings:       FINAL ASSESSMENT:   1. Severe mid-left circumflex stenosis with successful primary       percutaneous coronary intervention using a single bare metal stent.   2. Moderate left anterior descending coronary artery stenosis.   3. Normal right coronary artery.   4. Moderate segmental LV systolic dysfunction with estimated LVEF of       40-45% with mild mitral regurgitation.      Echocardiogram  Procedure date:  10/19/2010  Findings:        Study Conclusions   - Left ventricle: Very poor acoustic windows make evaluation    difficult. OVerall LVEF is depressed at approximately 30% with    severe hypokinesis of the inferior, posterior anterior,    anterolateral walls. Again, exam limited as endocardium is very  diffkuctl to see. Suggest repeat study when patient is more mobile, can be done in the echo lab. The cavity size was normal.    Wall thickness was increased in a pattern of mild LVH.  - Right ventricle: Systolic function was mildly reduced.  Transthoracic echocardiography. M-mode, complete 2D, spectral  Doppler, and color Doppler. Height: Height: 177cm. Height: 69.7in.  Weight: Weight: 102kg. Weight: 224.4lb. Body mass index: BMI:  32.6kg/m^2. Body surface area: BSA: 2.25m^2. Blood pressure: 121/81.  Patient status: Inpatient. Location: ICU/CCU Left ventricle: Very poor acoustic  windows make evaluation  difficult. OVerall LVEF is depressed at approximately 30% with  severe hypokinesis of the inferior, posterior anterior,  anterolateral walls. Again, exam limited as endocardium is very  diffkuctl to see. Suggest repeat study when patient is more mobile,  can be done in the echo lab. The cavity size was normal. Wall  thickness was increased in a pattern of mild LVH.  Aortic valve: Structurally normal valve. Cusp separation was normal.  Doppler: Transvalvular velocity was within the normal range. There  was no stenosis. No regurgitation.  Left atrium: The atrium was normal in size. Right ventricle: The cavity size was normal. Systolic function was  mildly reduced. Pulmonic valve: Structurally normal valve. Cusp separation was  normal. Doppler: Transvalvular velocity was within the normal range.  No regurgitation.  Tricuspid valve: Structurally normal valve. Leaflet separation was  normal. Doppler: Transvalvular velocity was within the normal range.  Trivial regurgitation.  Right atrium: The atrium was normal in size.  Pericardium: There was no pericardial effusion.  MRI EXAM  Procedure date:  10/20/2010  Findings:       Cardiac MRI:    Indication: Ischemic DCM ? EF    Protocol:  The patient was scanned on a GE 1.5 Tesla magnet.   Functional imaging was done using Fiesta sequences.  2,3 and 4   chamber views were done to assess RWMA's.  The patient received 50   cc of Magnevist.  After 10 minutes inversion recovery sequences   were done to assess viablity.  Quantitative EF was calculated using   mass analysis software on a GE ADAC work-station    Findings:  There was mild to moderate LVE.  There was moderate LVH   with septal thickness of 14 mm.  There was diffuse hypokinesis   worse in the inferobasal and basal lateral wall.  There was   significant dysynergy between the lateral and septal wall.  The   inferobase was thinned.  There was a 2/3rd thickness scar involving    the inferior base    Quantitative EF was 31% ( EDV 97cc, ESV 67cc SV 30 cc )    Impression:       1)    Mild to moderate LVE with moderate LVH.  Diffuse hypokinesis         worse in the inferobasal and basal lateral walls.  Significant         dysynergy between septum and lateral walls.  EF 31%    2)    Inferobasal scar with EF disporportionately low compared to   infarct size    Read By:  Wendall Stade,  M.D.   CXR  Procedure date:  10/19/2010  Findings:        Clinical Data: Post catheterization.    PORTABLE CHEST - 1 VIEW    Comparison: CT of 09/16/2009.  Most recent plain film of   08/24/2007.    Findings: Midline trachea.  Normal heart size and mediastinal   contours. No pleural effusion or pneumothorax.  Clear lungs.    IMPRESSION:   No acute cardiopulmonary disease.   Impression & Recommendations:  Problem # 1:  CHRONIC SYSTOLIC HEART FAILURE (ICD-428.22) The patient has an ischemic CM (EF 30), NYHA Class II/III CHF, and CAD.  He is s/p emergent PCI of the RCI following inferior STEMI 10/18/10.  He is now on an optimal medical regimen.  Per guidelines, we will continue medical therapy and then reassess his ejection fraction with echo 90 days post revascularization.  If his EF remains <35% at that time, we will offer ICD.  If his EF is >35%,  then we would not offer ICD and would take off the lifevest at that time.  The patient is amenable to this plan. I will see him back in the office early March at which time we will repeat his echo.  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) no further symptoms of ischemi continue medical therapy as above  Problem # 3:  TOBACCO USE (ICD-305.1) pt encouraged in his cessation  Problem # 4:  HYPERTENSION (ICD-401.9) stable  Other Orders: Cardiac Rehabilitation (Cardiac Rehab) Echocardiogram (Echo)  Patient Instructions: 1)  Your physician recommends that you schedule a  follow-up appointment in: First date in March that Dr Johney Frame is here 2)  Your physician has requested that you have an echocardiogram.  Echocardiography is a painless test  that uses sound waves to create images of your heart. It provides your doctor with information about the size and shape of your heart and how well your heart's chambers and valves are working.  This procedure takes approximately one hour. There are no restrictions for this procedure.

## 2011-01-25 ENCOUNTER — Encounter: Payer: Self-pay | Admitting: Internal Medicine

## 2011-01-25 ENCOUNTER — Ambulatory Visit (INDEPENDENT_AMBULATORY_CARE_PROVIDER_SITE_OTHER): Payer: Medicare Other | Admitting: Internal Medicine

## 2011-01-25 ENCOUNTER — Ambulatory Visit (HOSPITAL_COMMUNITY): Payer: Medicare Other | Attending: Cardiovascular Disease

## 2011-01-25 DIAGNOSIS — I2589 Other forms of chronic ischemic heart disease: Secondary | ICD-10-CM

## 2011-01-25 DIAGNOSIS — I5022 Chronic systolic (congestive) heart failure: Secondary | ICD-10-CM

## 2011-01-25 DIAGNOSIS — F1410 Cocaine abuse, uncomplicated: Secondary | ICD-10-CM | POA: Insufficient documentation

## 2011-01-25 DIAGNOSIS — I2510 Atherosclerotic heart disease of native coronary artery without angina pectoris: Secondary | ICD-10-CM

## 2011-01-25 DIAGNOSIS — F1010 Alcohol abuse, uncomplicated: Secondary | ICD-10-CM | POA: Insufficient documentation

## 2011-01-25 DIAGNOSIS — B1920 Unspecified viral hepatitis C without hepatic coma: Secondary | ICD-10-CM | POA: Insufficient documentation

## 2011-02-02 LAB — BASIC METABOLIC PANEL
BUN: 11 mg/dL (ref 6–23)
BUN: 7 mg/dL (ref 6–23)
CO2: 24 mEq/L (ref 19–32)
CO2: 25 mEq/L (ref 19–32)
CO2: 30 mEq/L (ref 19–32)
Chloride: 103 mEq/L (ref 96–112)
Chloride: 103 mEq/L (ref 96–112)
Chloride: 105 mEq/L (ref 96–112)
Creatinine, Ser: 1.04 mg/dL (ref 0.4–1.5)
Creatinine, Ser: 1.06 mg/dL (ref 0.4–1.5)
Creatinine, Ser: 1.1 mg/dL (ref 0.4–1.5)
GFR calc Af Amer: >60 mL/min (ref >60)
Glucose, Bld: 105 mg/dL — ABNORMAL HIGH (ref 70–99)
Glucose, Bld: 98 mg/dL (ref 70–99)
Potassium: 4.1 mEq/L (ref 3.5–5.1)
Potassium: 4.1 mEq/L (ref 3.5–5.1)

## 2011-02-02 LAB — CBC
HCT: 45.9 % (ref 39.0–52.0)
HCT: 50.7 % (ref 39.0–52.0)
Hemoglobin: 15.7 g/dL (ref 13.0–17.0)
MCH: 31.4 pg (ref 26.0–34.0)
MCH: 31.5 pg (ref 26.0–34.0)
MCH: 32 pg (ref 26.0–34.0)
MCHC: 34.2 g/dL (ref 30.0–36.0)
MCV: 92 fL (ref 78.0–100.0)
MCV: 93 fL (ref 78.0–100.0)
MCV: 93.1 fL (ref 78.0–100.0)
Platelets: 178 10*3/uL (ref 150–400)
Platelets: 184 10*3/uL (ref 150–400)
Platelets: 204 10*3/uL (ref 150–400)
RBC: 4.99 MIL/uL (ref 4.22–5.81)
RBC: 5.45 MIL/uL (ref 4.22–5.81)
RDW: 13 % (ref 11.5–15.5)
RDW: 13.3 % (ref 11.5–15.5)
WBC: 10.9 10*3/uL — ABNORMAL HIGH (ref 4.0–10.5)

## 2011-02-02 LAB — COMPREHENSIVE METABOLIC PANEL
ALT: 43 U/L (ref 0–53)
AST: 42 U/L — ABNORMAL HIGH (ref 0–37)
Albumin: 3.5 g/dL (ref 3.5–5.2)
Alkaline Phosphatase: 42 U/L (ref 39–117)
BUN: 7 mg/dL (ref 6–23)
CO2: 25 mEq/L (ref 19–32)
Calcium: 8.4 mg/dL (ref 8.4–10.5)
Chloride: 105 mEq/L (ref 96–112)
Creatinine, Ser: 0.97 mg/dL (ref 0.4–1.5)
GFR calc Af Amer: >60 mL/min (ref >60)
GFR calc non Af Amer: >60 mL/min (ref >60)
Glucose, Bld: 113 mg/dL — ABNORMAL HIGH (ref 70–99)
Potassium: 3.8 mEq/L (ref 3.5–5.1)
Sodium: 135 mEq/L (ref 135–145)
Total Bilirubin: 0.7 mg/dL (ref 0.3–1.2)
Total Protein: 6.1 g/dL (ref 6.0–8.3)

## 2011-02-02 LAB — POCT I-STAT, CHEM 8
BUN: 7 mg/dL (ref 6–23)
Calcium, Ion: 1.22 mmol/L (ref 1.12–1.32)
Chloride: 103 mEq/L (ref 96–112)
Creatinine, Ser: 1.1 mg/dL (ref 0.4–1.5)
Glucose, Bld: 115 mg/dL — ABNORMAL HIGH (ref 70–99)
HCT: 49 % (ref 39.0–52.0)
Hemoglobin: 16.7 g/dL (ref 13.0–17.0)
Potassium: 4 mEq/L (ref 3.5–5.1)
Sodium: 140 mEq/L (ref 135–145)
TCO2: 24 mmol/L (ref 0–100)

## 2011-02-02 LAB — LIPID PANEL
HDL: 22 mg/dL — ABNORMAL LOW (ref >39)
Total CHOL/HDL Ratio: 7.6 RATIO
Triglycerides: 314 mg/dL — ABNORMAL HIGH (ref <150)

## 2011-02-02 LAB — CARDIAC PANEL(CRET KIN+CKTOT+MB+TROPI)
CK, MB: 31.3 ng/mL (ref 0.3–4.0)
Relative Index: 7 — ABNORMAL HIGH (ref 0.0–2.5)
Total CK: 447 U/L — ABNORMAL HIGH (ref 7–232)
Troponin I: 1.24 ng/mL (ref 0.00–0.06)

## 2011-02-02 LAB — PROTIME-INR
INR: 8.8 (ref 0.00–1.49)
Prothrombin Time: 71.4 seconds — ABNORMAL HIGH (ref 11.6–15.2)

## 2011-02-02 LAB — TROPONIN I: Troponin I: 35.94 ng/mL (ref 0.00–0.06)

## 2011-02-02 LAB — BRAIN NATRIURETIC PEPTIDE: Pro B Natriuretic peptide (BNP): 53 pg/mL (ref 0.0–100.0)

## 2011-02-02 LAB — TSH: TSH: 8.185 u[IU]/mL — ABNORMAL HIGH (ref 0.350–4.500)

## 2011-02-02 NOTE — Assessment & Plan Note (Signed)
Summary: FOLLOW UP/ECHO AT 930/SL/KL  Medications Added DIAZEPAM 5 MG TABS (DIAZEPAM) three times a day      Allergies Added: NKDA  Visit Type:  Follow-up Referring Provider:  Dr Excell Seltzer Primary Provider:  Olena Leatherwood   History of Present Illness: The patient presents today for routine electrophysiology followup. He reports doing very well since last being seen in our clinic.  He has been quite active.  He reports no episodes of chest pain.  He has minimal SOB.   The patient denies symptoms of palpitations,  orthopnea, PND, lower extremity edema, dizziness, presyncope, syncope, or neurologic sequela. The patient is tolerating medications without difficulties and is otherwise without complaint today.   Current Medications (verified): 1)  Budeprion Xl 300 Mg Xr24h-Tab (Bupropion Hcl) .... Take 1 Tablet By Mouth Once A Day 2)  Doxepin Hcl 75 Mg Caps (Doxepin Hcl) .... Take 1 Tablet By Mouth Once A Day 3)  Lisinopril 10 Mg Tabs (Lisinopril) .... Take 1 Tablet By Mouth Once A Day 4)  Celexa 20 Mg Tabs (Citalopram Hydrobromide) .... Take 1 Tablet By Mouth Once A Day 5)  Abilify 5 Mg Tabs (Aripiprazole) .... Take 1 Tablet By Mouth Once A Day 6)  Clonazepam 0.5 Mg Tabs (Clonazepam) .... Take 1 Tablet By Mouth Three Times A Day 7)  Carvedilol 12.5 Mg Tabs (Carvedilol) .... Take 1 Tablet By Mouth Two Times A Day 8)  Lipitor 80 Mg Tabs (Atorvastatin Calcium) .... Take 1 Tablet By Mouth Once A Day 9)  Nitrostat 0.4 Mg Subl (Nitroglycerin) .... Use As Directed 10)  Aspirin 325 Mg Tabs (Aspirin) .... Take 1 Tablet By Mouth Once A Day 11)  Plavix 75 Mg Tabs (Clopidogrel Bisulfate) .... Take 1 Tablet By Mouth Once A Day 12)  Famotidine 20 Mg Tabs (Famotidine) .Marland Kitchen.. 1 By Mouth Two Times A Day 13)  Hydrocortisone 2.5 % Crea (Hydrocortisone) .... Apply As Directed 14)  Diazepam 5 Mg Tabs (Diazepam) .... Three Times A Day  Allergies (verified): No Known Drug Allergies  Past History:  Past Medical  History: CAD s/p acure inferior MI 10/18/10 requiring PCI of the RCA Ischemic CM (EF 40%) NYHA Class II/III CHF Depression Hepatitis C Hypertension hx/o Cocaine abuse in 80s, ex-IVDU hx/o Alcohol abuse Pulmonary Nodule Anal Fissure  Past Surgical History: Reviewed history from 12/02/2010 and no changes required. cath with PCI RCA 10/19/10  Social History: Reviewed history from 12/02/2010 and no changes required. Pt lives in [place].  He smoked previously but quit with his MI 11/11. Alcohol- quit denies drug use Married  Review of Systems       All systems are reviewed and negative except as listed in the HPI.   Vital Signs:  Patient profile:   45 year old male Height:      69 inches Weight:      228 pounds BMI:     33.79 Pulse rate:   57 / minute BP sitting:   110 / 88  (right arm)  Vitals Entered By: Laurance Flatten CMA (January 25, 2011 10:40 AM)  Physical Exam  General:  Well developed, well nourished, in no acute distress. Head:  normocephalic and atraumatic Eyes:  PERRLA/EOM intact; conjunctiva and lids normal. Mouth:  Teeth, gums and palate normal. Oral mucosa normal. Neck:  supple Lungs:  Clear bilaterally to auscultation and percussion. Heart:  normal rate, regular rhythm, no murmur, no gallop, and no rub.   Abdomen:  Bowel sounds positive; abdomen soft and non-tender  without masses, organomegaly, or hernias noted. No hepatosplenomegaly. Msk:  Back normal, normal gait. Muscle strength and tone normal. Extremities:  No clubbing or cyanosis. Neurologic:  Alert and oriented x 3. Skin:  Intact without lesions or rashes.   EKG  Procedure date:  01/25/2011  Findings:      sinus bradycardia 57 bpm, inferior infarction, otherwise normal ekg  Impression & Recommendations:  Problem # 1:  CHRONIC SYSTOLIC HEART FAILURE (ICD-428.22) doing well with medical therapy for CHF, presently NYHA Class II I reviewed the patient's echo in detail with Dr Tenny Craw today.  She  feels that when compared to the patient's prior echo, his EF has clearly improved (now 40%).  She does not feel that repeat cardiac MRI is necessary at this time.  We will therefore continue medical therapy for CHF longterm.  As his EF has improved, I will discontinue his lifevest at this time. He does not meet criteria for ICD implantation.  He will continue to follow closely with Dr Myrtis Ser.  I will see him again as needed if his EF falls below 35% or if other EP issues arise.  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) stable without symptoms of ischemia  Problem # 3:  TOBACCO USE (ICD-305.1) he is no longer smoking  Problem # 4:  HYPERTENSION (ICD-401.9) stable  Patient Instructions: 1)  Your physician recommends that you schedule a follow-up appointment in: 3 months with Dr Excell Seltzer 2)  okay to d/c Lifevest  Prevention & Chronic Care Immunizations   Influenza vaccine: Fluvax Non-MCR  (10/02/2007)    Tetanus booster: Not documented    Pneumococcal vaccine: Not documented  Other Screening   Smoking status: quit  (11/04/2010)  Lipids   Total Cholesterol: 164  (12/15/2006)   LDL: 115  (12/15/2006)   LDL Direct: Not documented   HDL: 27  (12/15/2006)   Triglycerides: 112  (12/15/2006)    SGOT (AST): 31  (12/15/2006)   SGPT (ALT): 65  (12/15/2006)   Alkaline phosphatase: 60  (12/15/2006)   Total bilirubin: 0.7  (12/15/2006)  Hypertension   Last Blood Pressure: 110 / 88  (01/25/2011)   Serum creatinine: 1.00  (12/15/2006)   Serum potassium 4.3  (12/15/2006)  Self-Management Support :    Hypertension self-management support: Not documented    Lipid self-management support: Not documented

## 2011-04-06 NOTE — Assessment & Plan Note (Signed)
[facility]                         GASTROENTEROLOGY OFFICE NOTE   NAME:Angel Donovan, Angel Donovan                     MRN:          [id number]  DATE:01/10/2008                            DOB:          [birth year]    PROBLEM:  Anal fissure.   [name] has  returned for scheduled followup.  On a regimen of warm  soaks and diltiazem ointment his anal fissure has significantly  improved.  The pain is now only intermittent.  He has no other GI  complaints.  He continues on therapy for his hepatitis C.  On exam pulse  64, blood pressure 110/78, weight 204.   IMPRESSION:  1. Anal fissure.  2. Hepatitis C.   RECOMMENDATIONS:  1. Continue current regimen for his fissure.  2. Colonoscopy.     Barbette Hair. Arlyce Dice, MD,FACG  Electronically Signed    RDK/MedQ  DD: 01/10/2008  DT: 01/10/2008  Job #: [id number]   cc:   Sharon Mt, Dr

## 2011-04-06 NOTE — Assessment & Plan Note (Signed)
[facility]                         GASTROENTEROLOGY OFFICE NOTE   NAME:Angel Donovan, Angel Donovan                     MRN:          [id number]  DATE:12/04/2007                            DOB:          Dec 26, 1965    REASON FOR CONSULTATION:  Rectal pain.   Angel Donovan is a 45 year old white male referred through the courtesy of  the physicians at [facility] for evaluation.  For several  months he has been complaining of exquisite rectal pain.  Pain is  especially severe with a bowel movement.  He has had occasional scant  bleeding consisting of bright red blood on the toilet tissue.  He moves  his bowels regularly.  He is undergoing therapy for his hepatitis C  including Interferon.  He has lost 20 pounds over the last six months.   PAST MEDICAL HISTORY:  Pertinent for depression and hepatitis C.   FAMILY HISTORY:  Remarkable for his father and several father's brothers  who had colon cancer.  Father had colon cancer at age 65.   MEDICATIONS:  1. Pepcid.  2. Ribavirin.  3. Paxil.  4. Antoxicine.   ALLERGIES:  He has no allergies.   SOCIAL HISTORY:  He smokes 3/4 of a pack a day.  He does not drink.  He  is married and unemployed.   REVIEW OF SYSTEMS:  Positive for joint pains, back pain, headaches and  fatigue.    ,   PHYSICAL EXAMINATION:  VITAL SIGNS:  Pulse: 72.  Blood pressure:  138/82.  Weight:  207.  HEENT: EOMI.  PERRLA.  Sclerae are anicteric.  Conjunctivae are pink.  NECK:  Supple without thyromegaly, adenopathy or carotid bruits.  CHEST:  Clear to auscultation and percussion without adventitious  sounds.  CARDIAC:  Regular rhythm; normal S1 S2.  There are no murmurs, gallops  or rubs.  ABDOMEN:  On abdominal exam liver is palpable at the right costal  margin.  HEPR percusses to 12 cm.  EXTREMITIES:  Full range of motion.  No cyanosis, clubbing or edema.  RECTAL:  On rectal exam there is a fissure present at approximately  5:00  o'clock.  Remainder of the physical exam is normal.   IMPRESSION:  1. Symptomatic anal fissure.  2. Family history of colorectal carcinoma.  3. Hepatitis C.   RECOMMENDATIONS:  1. Warm soaks.  2. Diltiazem ointment 2% twice a day.  3. Colonoscopy (this will be delayed until his anal fissure has      cleared up).     Barbette Hair. Arlyce Dice, MD,FACG  Electronically Signed    RDK/MedQ  DD: 12/04/2007  DT: 12/04/2007  Job #: [id number]   cc:   Sharon Mt, M.D.

## 2011-04-06 NOTE — Assessment & Plan Note (Signed)
[facility]                             PULMONARY OFFICE NOTE   NAME:HUNDLEYTaheem, Angel Donovan                     MRN:          [id number]  DATE:09/08/2007                            DOB:          [birth year]    HISTORY OF PRESENT ILLNESS:  Angel Donovan is a 45 year old Caucasian  heavy smoker who presents for evaluation of an abnormal imaging study.  He has recently been started on treatment for hepatitis C with ribavirin  and interferon injections.  He is enrolled in some kind of a research  trial, which seems to be a drug comparison between 2 different kinds of  long-acting interferon.  At any rate, he developed dyspnea on exertion  and a dry cough over the past 3 months, which is worse over the last 1  month.  Chest x-ray raised the question of a nodule in the left upper  lobe.  A followup chest CT showed several areas of scattered tree and  bud nodular opacities.  The most dominant area seemed to be in the left  lower lobe.  Very mild bronchiectasis was also noted.  A right adrenal  adenoma 1.5x1.1 cm was noted, which appeared benign, hence he is  referred to [place].   Denies productive cough, hemoptysis, or wheezing.  He has no childhood  history of asthma.  He has smoked about a pack per day for the last 30  years.   PAST MEDICAL HISTORY:  Includes hypertension, hepatitis C, ex-IV drug  user (cocaine) this was diagnosed in 1998 when he was incarcerated.  PPD  status is unknown.   PAST SURGICAL HISTORY:  None.   ALLERGIES:  None.   CURRENT MEDICATIONS:  1. Nortriptyline 25 mg nightly (for insomnia).  2. Lexapro 20 mg daily.  3. Hydrochlorothiazide 12.5 mg daily.  4. Pepcid 20 mg b.i.d.  5. Ribavirin b.i.d.  6. Interferon (research medicine) every week.   SOCIAL HISTORY:  About 30 pack-years smoking.  Continues to smoke about  a pack per day.  He is reported HIV negative.  Ex-IV drug user and  alcohol user, sober since 1998, cocaine use.  He is  married and has a  son and daughter, aged 18 and 52.  He used to work [employer], but has  been disabled since 2003 due to back pain.   FAMILY HISTORY:  Colon cancer in his father.   REVIEW OF SYSTEMS:  Loss of 9 pounds in the last 6 weeks.  Flu-like  symptoms including generalized weakness and myalgias.  Loss of appetite.  Headaches.  Depression.  Joint stiffness.   PHYSICAL EXAM:  Height 5 feet 10 inches, weight 213 pounds, temperature  97.7, blood pressure 110/76.  Heart rate is 68, oxygen saturation 97% on  room air.  HEENT:  Narrow pharyngeal space.  No post-nasal drip.  No thrush.  NECK:  Supple.  No JVD.  No lymphadenopathy.  CVS:  S1, S2 normal.  CHEST:  Clear to auscultation.  ABDOMEN:  Soft and nontender.  No organomegaly.  NEUROLOGIC:  Nonfocal.  EXTREMITIES:  No edema.   IMPRESSION:  1. Nodular  opacities as outlined in the CT scan, with the most      prominent being in the left lower lobe, with tree and bud      appearance suggesting bronchiolitis or granulomatous disease.  The      differential diagnoses includes the above.  Malignancy seems less      likely probable.  2. Hepatitis C infection.  3. Right adrenal adenoma.  4. Likely chronic obstructive pulmonary disease with active tobacco      abuse.   RECOMMENDATIONS:  1. PFTs will be scheduled.  We will look for airway obstruction based      on his smoking and restriction, which may be related to      granulomatous disease or bronchiolitis.  2. A followup CT will be scheduled in 3 months' time.  If the      nodules/infiltrates are increasing in size, we may decide on an      appropriate biopsy.  3. It seems to be very difficult to tease out his flu-like symptoms as      to whether they are related to interferon or some other underlying      disease.  We may just have to wait to clarify this further.  4. Smoking cessation was discussed for more than 3 minutes.  I do not      think Chantix would be a good  idea at present, since he does have a      history of depression and is on interferon currently.  He is going      to make an attempt on his own to decrease the cigarettes, and      hopefully with his spirometry results, we will be able to motivate      him further.  We will keep you informed as to his progress.     Angel Milch, MD  Electronically Signed    RVA/MedQ  DD: 09/08/2007  DT: 09/09/2007  Job #: [id number]   cc:   [facility]  Patrica Duel, M.D.

## 2011-04-22 ENCOUNTER — Encounter: Payer: Self-pay | Admitting: Cardiovascular Disease

## 2011-05-10 ENCOUNTER — Encounter: Payer: Self-pay | Admitting: Cardiovascular Disease

## 2011-05-10 ENCOUNTER — Ambulatory Visit (INDEPENDENT_AMBULATORY_CARE_PROVIDER_SITE_OTHER): Payer: Medicare Other | Admitting: Cardiovascular Disease

## 2011-05-10 VITALS — BP 122/84 | HR 64 | Ht 70.0 in | Wt 220.0 lb

## 2011-05-10 DIAGNOSIS — I5022 Chronic systolic (congestive) heart failure: Secondary | ICD-10-CM

## 2011-05-10 DIAGNOSIS — I2510 Atherosclerotic heart disease of native coronary artery without angina pectoris: Secondary | ICD-10-CM

## 2011-05-10 NOTE — Patient Instructions (Signed)
Your physician has requested that you have a cardiac catheterization. Cardiac catheterization is used to diagnose and/or treat various heart conditions. Doctors may recommend this procedure for a number of different reasons. The most common reason is to evaluate chest pain. Chest pain can be a symptom of coronary artery disease (CAD), and cardiac catheterization can show whether plaque is narrowing or blocking your heart's arteries. This procedure is also used to evaluate the valves, as well as measure the blood flow and oxygen levels in different parts of your heart. For further information please visit www.cardiosmart.org. Please follow instruction sheet, as given.   Your physician recommends that you continue on your current medications as directed. Please refer to the Current Medication list given to you today.  

## 2011-05-11 ENCOUNTER — Encounter: Payer: Self-pay | Admitting: *Deleted

## 2011-05-11 ENCOUNTER — Encounter: Payer: Self-pay | Admitting: Cardiovascular Disease

## 2011-05-11 NOTE — Assessment & Plan Note (Signed)
The patient has recurrent angina. I reviewed his cardiac catheterization films and he does have moderate diffuse proximal LAD stenosis. He is on a good medical regimen for CAD. I have recommended repeat cardiac catheterization to assess for stent patency and the left circumflex as well as to reevaluate his LAD. I suspect a minimum he will need pressure wire analysis of the LAD if he has residual moderate stenosis in that region. Risks indications and procedure were reviewed in detail with the patient. He understands and agrees to proceed.

## 2011-05-11 NOTE — Assessment & Plan Note (Signed)
The patient is stable without evidence of active heart failure or volume overload. He continues on a combination of carvedilol and lisinopril. We'll reassess his left ventricular filling pressure and LV systolic function and cardiac catheterization.

## 2011-05-11 NOTE — Progress Notes (Signed)
HPI:  This is a 45 year old gentleman presented for followup evaluation. The patient has coronary artery disease and initially presented with an acute inferior wall MI in November 2011. He had subtotal occlusion of the large circumflex and was treated with a bare-metal stent. The patient was noted to have moderate residual LAD stenosis throughout the proximal LAD. He also had severe LV dysfunction at the time of his initial diagnosis. He was discharged from the hospital with a lifetest device. His followup echocardiogram demonstrated improvement in overall LV function with LVEF estimated at 40% by followup echo.  Unfortunately, the patient has developed substernal chest pressure with exertion over the last month. The first episode occurred about 4 weeks ago when he was doing some physical work. He stopped what he was doing and had not really pushed himself for several weeks until last week when he mowed his lawn. He had recurrent dyspnea and chest tightness with this activity. He stopped what he was doing and his symptoms resolved after several minutes. He denied nausea, and vomiting, or near syncope. He has had no resting chest pain or tightness. He has no other complaints today. He has been compliant with his medications.  Outpatient Encounter Prescriptions as of 05/10/2011  Medication Sig Dispense Refill  . ARIPiprazole (ABILIFY) 5 MG tablet Take 5 mg by mouth daily.        Marland Kitchen aspirin 325 MG tablet Take 325 mg by mouth daily.        Marland Kitchen atorvastatin (LIPITOR) 80 MG tablet Take 80 mg by mouth daily.        Marland Kitchen buPROPion (WELLBUTRIN XL) 300 MG 24 hr tablet Take 300 mg by mouth daily.        . carvedilol (COREG) 12.5 MG tablet Take 12.5 mg by mouth 2 (two) times daily with a meal.        . citalopram (CELEXA) 20 MG tablet Take 20 mg by mouth daily.        . clopidogrel (PLAVIX) 75 MG tablet Take 75 mg by mouth daily.        . cyclobenzaprine (FLEXERIL) 10 MG tablet Take 10 mg by mouth 3 (three) times daily as  needed.        . diazepam (VALIUM) 5 MG tablet Take 5 mg by mouth 3 (three) times daily.        . famotidine (PEPCID) 20 MG tablet Take 20 mg by mouth 2 (two) times daily.        Marland Kitchen lisinopril (PRINIVIL,ZESTRIL) 10 MG tablet Take 10 mg by mouth daily.        . meloxicam (MOBIC) 15 MG tablet Take 15 mg by mouth daily.        Marland Kitchen doxepin (SINEQUAN) 75 MG capsule Take 75 mg by mouth daily.        . nitroGLYCERIN (NITROSTAT) 0.4 MG SL tablet Place 0.4 mg under the tongue every 5 (five) minutes as needed.        Marland Kitchen DISCONTD: clonazePAM (KLONOPIN) 0.5 MG tablet Take 0.5 mg by mouth 3 (three) times daily.        Marland Kitchen DISCONTD: hydrocortisone 2.5 % cream Apply topically as directed.          No Known Allergies  Past Medical History  Diagnosis Date  . Coronary artery disease     s/p acute inferior MI 10/18/10 requiring PCI of the RCA  . Ischemic cardiomyopathy   . Congestive heart failure, NYHA class III   . Depression   .  Hepatitis C   . Hypertension   . History of cocaine abuse     in 80's, ex-IVDU  . History of alcohol abuse   . Pulmonary nodule   . Anal fissure     ROS: Negative except as per HPI  BP 122/84  Pulse 64  Ht 5\' 10"  (1.778 m)  Wt 220 lb (99.791 kg)  BMI 31.57 kg/m2  PHYSICAL EXAM: Pt is alert and oriented, overweight male in NAD HEENT: normal Neck: JVP - normal, carotids 2+= without bruits Lungs: CTA bilaterally CV: RRR without murmur or gallop Abd: soft, NT, Positive BS, no hepatomegaly Ext: no C/C/E, distal pulses intact and equal Skin: warm/dry no rash  ASSESSMENT AND PLAN:

## 2011-05-19 ENCOUNTER — Ambulatory Visit (HOSPITAL_COMMUNITY): Payer: Medicare Other

## 2011-05-19 ENCOUNTER — Ambulatory Visit (HOSPITAL_COMMUNITY)
Admission: RE | Admit: 2011-05-19 | Discharge: 2011-05-19 | Disposition: A | Discharge status: Home / Self Care | Payer: Medicare Other | Source: Ambulatory Visit | Attending: Cardiovascular Disease | Admitting: Cardiovascular Disease

## 2011-05-19 DIAGNOSIS — T82897A Other specified complication of cardiac prosthetic devices, implants and grafts, initial encounter: Secondary | ICD-10-CM | POA: Insufficient documentation

## 2011-05-19 DIAGNOSIS — I209 Angina pectoris, unspecified: Secondary | ICD-10-CM | POA: Insufficient documentation

## 2011-05-19 DIAGNOSIS — Z01818 Encounter for other preprocedural examination: Secondary | ICD-10-CM | POA: Insufficient documentation

## 2011-05-19 DIAGNOSIS — Z01810 Encounter for preprocedural cardiovascular examination: Secondary | ICD-10-CM | POA: Insufficient documentation

## 2011-05-19 DIAGNOSIS — I2510 Atherosclerotic heart disease of native coronary artery without angina pectoris: Secondary | ICD-10-CM

## 2011-05-19 DIAGNOSIS — Y831 Surgical operation with implant of artificial internal device as the cause of abnormal reaction of the patient, or of later complication, without mention of misadventure at the time of the procedure: Secondary | ICD-10-CM | POA: Insufficient documentation

## 2011-05-19 DIAGNOSIS — Z01812 Encounter for preprocedural laboratory examination: Secondary | ICD-10-CM | POA: Insufficient documentation

## 2011-05-19 DIAGNOSIS — Z79899 Other long term (current) drug therapy: Secondary | ICD-10-CM | POA: Insufficient documentation

## 2011-05-19 LAB — BASIC METABOLIC PANEL
Chloride: 107 mEq/L (ref 96–112)
Creatinine, Ser: 0.99 mg/dL (ref 0.50–1.35)
GFR calc Af Amer: >60 mL/min (ref >60)
Potassium: 4.2 mEq/L (ref 3.5–5.1)

## 2011-05-19 LAB — CBC
HCT: 45 % (ref 39.0–52.0)
Hemoglobin: 15.1 g/dL (ref 13.0–17.0)
RDW: 12.8 % (ref 11.5–15.5)
WBC: 6.8 10*3/uL (ref 4.0–10.5)

## 2011-05-19 LAB — PROTIME-INR: INR: 1.18 (ref 0.00–1.49)

## 2011-05-19 LAB — POCT ACTIVATED CLOTTING TIME: Activated Clotting Time: 243 seconds

## 2011-05-19 LAB — APTT: aPTT: 30 seconds (ref 24–37)

## 2011-05-21 ENCOUNTER — Telehealth: Payer: Self-pay | Admitting: Cardiovascular Disease

## 2011-05-21 NOTE — Telephone Encounter (Signed)
Pt had cath on 05/19/11  Pt says he and Dr. Excell Seltzer talked about him going back to [facility] and seeing Dr. Myrtis Ser. Pt did see PA Gene Serpe in December post cath and intervention.  He will call the [facility] office to follow-up in 2-4 weeks. Mylo Red RN

## 2011-05-21 NOTE — Telephone Encounter (Signed)
Pt had cardiac cath 6-27 and hasn't been told when or if he needs a follow up

## 2011-06-10 NOTE — Cardiovascular Report (Signed)
NAMEDONATHAN, BULLER              ACCOUNT NO.:  [id number]  MEDICAL RECORD NO.:  [id number]  LOCATION:                                 FACILITY:  PHYSICIAN:  Veverly Fells. Excell Seltzer, MD  DATE OF BIRTH:  1966/09/16  DATE OF PROCEDURE:  05/19/2011 DATE OF DISCHARGE:                           CARDIAC CATHETERIZATION   PROCEDURE: 1. Left heart catheterization. 2. Selective coronary angiography. 3. Left ventricular angiography. 4. Pressure wire analysis of the left anterior descending. 5. Pressure wire analysis of the left circumflex.  PROCEDURAL INDICATIONS:  Mr. Mcgrory is a 45 year old gentleman who presented with an inferior wall MI in November 2011.  He was treated with a bare-metal stent in his left circumflex.  He was noted to have moderate proximal LAD stenosis.  He presented with recurrent angina with class III symptoms and was referred back for cardiac catheterization and probable pressure wire analysis of the LAD and left circumflex.  Risks and indications of the procedure were reviewed with the patient. Informed consent was obtained.  The right wrist was prepped, draped and anesthetized with  1% lidocaine.  5000 units of unfractionated heparin was given intravenously, 3 mg of verapamil was given through the sheath. Standard 5-French Judkins catheters were used for coronary angiography and left ventriculography.  Following the diagnostic procedure, I elected to perform pressure wire analysis of the LAD which had 50% proximal stenosis and of the left circumflex which had 50-60% in-stent restenosis which had clearly changed from his prior procedure.  DIAGNOSTIC FINDINGS:  Aortic pressure 118/82 with a mean of 98, left ventricular pressure 119/20.  Left ventriculography shows mild diffuse global hypokinesis of the LV with an ejection fraction estimated at 45%.  This is improved from his previous study.  The RCA is codominant with the circumflex.  There is no  obstructive disease throughout the course of the right coronary artery and it does supply a small PDA branch.  Left coronary artery:  The left main is patent.  It divides into the LAD and left circumflex.  The left main is a short segment.  LAD:  The LAD is large-caliber vessel that reaches the left ventricular apex.  The vessel has 50% proximal stenosis and no other significant stenoses are seen throughout the course of the LAD.  There are three moderate caliber diagonal branches present.  Left circumflex:  The left circumflex is of large caliber.  It gives off large first OM branch, a second OM branch, a left posterolateral branch and a left PDA.  There is no high-grade obstructive disease throughout. The stented segment in the mid circumflex has 50-60% in-stent restenosis.  It is unclear if this is hemodynamically significant.  Pressure wire analysis:  An additional 3000 units of unfractionated heparin was given after the ACT was 198.  The ACT rose to about 250. Using normal technique, a 5-French EBU guide catheter was inserted and a pressure wire was used to analyze both the LAD and the left circumflex. Intravenous adenosine was administered.  The FFR in the LAD was 0.84 at peak hyperemia and the FFR in the distal left circumflex was 0.87 at peak hyperemia.  This did not meet criteria for revascularization  and the wire was removed as was the guide catheter.  The patient tolerated the entire procedure well.  There were no immediate complications.  ASSESSMENT: 1. Moderate proximal left anterior descending stenosis, unchanged from     previous study. 2. Moderate in-stent restenosis of the left circumflex. 3. No significant disease in the right coronary artery. 4. Improvement in left ventricular function, with residual mild global     dysfunction and an ejection fraction estimated at 45%.  RECOMMENDATIONS:  I would continue the patient's current medical therapy.  He appears to be  stable from a cardiovascular standpoint and he does not have hemodynamically significant coronary stenoses.     Veverly Fells. Excell Seltzer, MD     MDC/MEDQ  D:  05/19/2011  T:  05/19/2011  Job:  [id number]  Electronically Signed by Tonny Bollman MD on 06/10/2011 12:34:44 AM

## 2011-06-22 ENCOUNTER — Encounter: Payer: Self-pay | Admitting: Cardiology

## 2011-06-22 DIAGNOSIS — I2510 Atherosclerotic heart disease of native coronary artery without angina pectoris: Secondary | ICD-10-CM | POA: Insufficient documentation

## 2011-06-22 DIAGNOSIS — R9430 Abnormal result of cardiovascular function study, unspecified: Secondary | ICD-10-CM | POA: Insufficient documentation

## 2011-06-23 ENCOUNTER — Ambulatory Visit (INDEPENDENT_AMBULATORY_CARE_PROVIDER_SITE_OTHER): Payer: Medicare Other | Admitting: Cardiology

## 2011-06-23 ENCOUNTER — Encounter: Payer: Self-pay | Admitting: Cardiology

## 2011-06-23 VITALS — BP 107/75 | HR 114 | Ht 69.0 in | Wt 215.0 lb

## 2011-06-23 DIAGNOSIS — R9430 Abnormal result of cardiovascular function study, unspecified: Secondary | ICD-10-CM

## 2011-06-23 DIAGNOSIS — R0989 Other specified symptoms and signs involving the circulatory and respiratory systems: Secondary | ICD-10-CM

## 2011-06-23 DIAGNOSIS — I2510 Atherosclerotic heart disease of native coronary artery without angina pectoris: Secondary | ICD-10-CM

## 2011-06-23 DIAGNOSIS — IMO0002 Reserved for concepts with insufficient information to code with codable children: Secondary | ICD-10-CM

## 2011-06-23 MED ORDER — CARVEDILOL 25 MG PO TABS: 25.0000 mg | ORAL_TABLET | Freq: Two times a day (BID) | ORAL | Status: DC

## 2011-06-23 NOTE — Progress Notes (Signed)
HPI Patient is seen for cardiology followup.  He has known coronary disease.  Recently he had increased symptoms and Dr. Excell Seltzer decided to proceed with repeat catheterization.  This was done successfully.  He included flow wire assessment of both the LAD and circumflex.  It was felt that his disease of the LAD remains moderate and unchanged.  There was moderate in-stent restenosis of the circumflex.  Medical therapy was recommended.  Since that time the patient notes that with significant increase in exertion he feels increased heart rate and has fatigue.  He's not had any more chest pain.   Marland KitchenNo Known Allergies  Current Outpatient Prescriptions  Medication Sig Dispense Refill  . ARIPiprazole (ABILIFY) 5 MG tablet Take 5 mg by mouth daily.        Marland Kitchen aspirin 325 MG tablet Take 325 mg by mouth daily.        Marland Kitchen atorvastatin (LIPITOR) 80 MG tablet Take 80 mg by mouth daily.        Marland Kitchen buPROPion (WELLBUTRIN XL) 300 MG 24 hr tablet Take 300 mg by mouth daily.        . carvedilol (COREG) 12.5 MG tablet Take 12.5 mg by mouth 2 (two) times daily with a meal.        . citalopram (CELEXA) 20 MG tablet Take 20 mg by mouth daily.        . clopidogrel (PLAVIX) 75 MG tablet Take 75 mg by mouth daily.        . cyclobenzaprine (FLEXERIL) 10 MG tablet Take 10 mg by mouth 3 (three) times daily as needed.        . diazepam (VALIUM) 5 MG tablet Take 5 mg by mouth 3 (three) times daily.        Marland Kitchen doxepin (SINEQUAN) 75 MG capsule Take 75 mg by mouth daily.        . famotidine (PEPCID) 20 MG tablet Take 20 mg by mouth 2 (two) times daily.        Marland Kitchen lisinopril (PRINIVIL,ZESTRIL) 10 MG tablet Take 10 mg by mouth daily.        . meloxicam (MOBIC) 15 MG tablet Take 15 mg by mouth daily.        . nitroGLYCERIN (NITROSTAT) 0.4 MG SL tablet Place 0.4 mg under the tongue every 5 (five) minutes as needed.          History   Social History  . Marital Status: Married    Spouse Name: N/A    Number of Children: N/A  . Years of  Education: N/A   Occupational History  . Not on file.   Social History Main Topics  . Smoking status: Former Smoker -- 1.0 packs/day for 35 years    Types: Cigarettes    Quit date: 10/18/2010  . Smokeless tobacco: Never Used  . Alcohol Use: No     quit  . Drug Use: No  . Sexually Active: Not on file   Other Topics Concern  . Not on file   Social History Narrative  . No narrative on file    Family History  Problem Relation Age of Onset  . Coronary artery disease Father   . Cancer Father     Past Medical History  Diagnosis Date  . 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  Ejection fraction < 50%     EF 30% echo November, 2011 (MI)  / EF 40%, echo, months after MI  /  EF 45%, cardiac catheterization, June, 201 to  . CHF (congestive heart failure)     Initially post MI, then improved.  . Depression   . Hepatitis C   . Hypertension   . History of cocaine abuse     in 80's, ex-IVDU  . History of alcohol abuse   . Pulmonary nodule   . Anal fissure     Past Surgical History  Procedure Date  . Cardiac catheterization     with PCI RCA 10/19/2010    ROS  Patient denies fever, chills, headache, sweats, rash, change in vision, change in hearing, chest pain, cough, nausea vomiting, urinary symptoms.  All other systems are reviewed and are negative.  PHYSICAL EXAM Patient is oriented to person time and place.  Affect is normal.  Head is atraumatic.  No xanthelasma.  There is no jugular venous distention.  Lungs are clear.  Respiratory effort is unlabored.  Cardiac exam reveals S1-S2.  No clicks or significant murmurs.  The abdomen is soft.  No peripheral edema. Filed Vitals:   06/23/11 1349  BP: 107/75  Pulse: 114  Height: 5\' 9"  (1.753 m)  Weight: 215 lb (97.523 kg)      EKG Is not done today.  ASSESSMENT & PLAN

## 2011-06-23 NOTE — Assessment & Plan Note (Signed)
I am increasing his beta blocker at this time for several reasons.  I have chosen not to lower his lisinopril.

## 2011-06-23 NOTE — Patient Instructions (Signed)
Your physician wants you to follow-up in: 6 months. You will receive a reminder letter in the mail one-two months in advance. If you don't receive a letter, please call our office to schedule the follow-up appointment. Increase Coreg (carvedilol) to 25 mg two times a day. You may take 2 of your 12.5 mg tablets twice per day until gone and then get new prescription filled for 25 mg tablets.

## 2011-06-23 NOTE — Assessment & Plan Note (Signed)
Coronary disease is stable.  No change in therapy.  The patient asked if he could use medications for his erectile dysfunction.  I have approved this.  He knows not to use the medication with nitroglycerin at any time.  I do think that he may be having symptoms from increased heart rate when he tries to exercise.  I decided to increase his beta blocker.

## 2011-09-02 NOTE — Progress Notes (Signed)
This encounter was created in error - please disregard.

## 2011-10-11 ENCOUNTER — Other Ambulatory Visit: Payer: Self-pay | Admitting: *Deleted

## 2011-10-11 MED ORDER — LISINOPRIL 10 MG PO TABS: 10.0000 mg | ORAL_TABLET | Freq: Every day | ORAL | Status: DC

## 2011-10-11 MED ORDER — ATORVASTATIN CALCIUM 80 MG PO TABS: 80.0000 mg | ORAL_TABLET | Freq: Every day | ORAL | Status: DC

## 2011-11-17 ENCOUNTER — Other Ambulatory Visit: Payer: Self-pay | Admitting: Cardiovascular Disease

## 2011-12-22 ENCOUNTER — Encounter: Payer: Self-pay | Admitting: Cardiology

## 2011-12-22 ENCOUNTER — Ambulatory Visit (INDEPENDENT_AMBULATORY_CARE_PROVIDER_SITE_OTHER): Payer: Medicare Other | Admitting: Cardiology

## 2011-12-22 DIAGNOSIS — I2510 Atherosclerotic heart disease of native coronary artery without angina pectoris: Secondary | ICD-10-CM

## 2011-12-22 DIAGNOSIS — I509 Heart failure, unspecified: Secondary | ICD-10-CM

## 2011-12-22 MED ORDER — NITROGLYCERIN 0.4 MG SL SUBL: 0.4000 mg | SUBLINGUAL_TABLET | SUBLINGUAL | Status: DC | PRN

## 2011-12-22 NOTE — Assessment & Plan Note (Signed)
Coronary disease is stable. No further workup needed at this time. 

## 2011-12-22 NOTE — Progress Notes (Signed)
HPI  Patient is feeling well. He has known coronary disease. I saw him last 6 months ago. He had undergone catheterization in June, 2012. Both the LAD and the circumflex were carefully evaluated. They were felt to be stable. Ejection fraction had improved to 45%. He's being followed medically. He's not having any significant chest pain or shortness of breath.  No Known Allergies  Current Outpatient Prescriptions  Medication Sig Dispense Refill  . ARIPiprazole (ABILIFY) 5 MG tablet Take 5 mg by mouth daily.        Marland Kitchen aspirin 325 MG tablet Take 325 mg by mouth daily.        Marland Kitchen atorvastatin (LIPITOR) 80 MG tablet Take 1 tablet (80 mg total) by mouth daily.  30 tablet  3  . buPROPion (WELLBUTRIN XL) 300 MG 24 hr tablet Take 300 mg by mouth daily.        . carvedilol (COREG) 25 MG tablet Take 1 tablet (25 mg total) by mouth 2 (two) times daily.  60 tablet  6  . citalopram (CELEXA) 20 MG tablet Take 20 mg by mouth daily.        . cyclobenzaprine (FLEXERIL) 10 MG tablet Take 10 mg by mouth 3 (three) times daily as needed.        . diazepam (VALIUM) 5 MG tablet Take 5 mg by mouth 3 (three) times daily.        Marland Kitchen doxepin (SINEQUAN) 75 MG capsule Take 75 mg by mouth daily.        . famotidine (PEPCID) 20 MG tablet Take 20 mg by mouth 2 (two) times daily.        Marland Kitchen lisinopril (PRINIVIL,ZESTRIL) 10 MG tablet Take 1 tablet (10 mg total) by mouth daily.  30 tablet  3  . meloxicam (MOBIC) 15 MG tablet Take 15 mg by mouth daily.        . nitroGLYCERIN (NITROSTAT) 0.4 MG SL tablet Place 0.4 mg under the tongue every 5 (five) minutes as needed.        Marland Kitchen PLAVIX 75 MG tablet TAKE ONE TABLET BY MOUTH EVERY DAY WITH FOOD  30 each  11    History   Social History  . Marital Status: Married    Spouse Name: N/A    Number of Children: N/A  . Years of Education: N/A   Occupational History  . Not on file.   Social History Main Topics  . Smoking status: Former Smoker -- 1.0 packs/day for 35 years    Types:  Cigarettes    Quit date: 10/18/2010  . Smokeless tobacco: Never Used  . Alcohol Use: No     quit  . Drug Use: No  . Sexually Active: Not on file   Other Topics Concern  . Not on file   Social History Narrative  . No narrative on file    Family History  Problem Relation Age of Onset  . Coronary artery disease Father   . Cancer Father     Past Medical History  Diagnosis Date  . 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 Ejection fraction < 50%     EF 30% echo November, 2011 (MI)  / EF 40%, echo, months after MI  /  EF 45%, cardiac catheterization, June, 201 to  . CHF (congestive  heart failure)     Initially post MI, then improved.  . Depression   . Hepatitis C   . Hypertension   . History of cocaine abuse     in 80's, ex-IVDU  . History of alcohol abuse   . Pulmonary nodule   . Anal fissure     Past Surgical History  Procedure Date  . Cardiac catheterization     with PCI RCA 10/19/2010    ROS  Patient denies fever, chills, headache, sweats, rash, change in vision, change in hearing, chest pain, cough, nausea vomiting, urinary symptoms. All other systems are reviewed and are negative.  PHYSICAL EXAM  Patient is oriented to person time and place. Affect is normal. Head is atraumatic. There is no jugular venous distention. There are no carotid bruits. Lungs are clear. Respiratory effort is nonlabored. Cardiac exam reveals S1 and S2. There no clicks or significant murmurs. The abdomen is soft. There is no peripheral edema.  Filed Vitals:   12/22/11 1305  BP: 107/75  Pulse: 65  Height: 5\' 9"  (1.753 m)  Weight: 216 lb (97.977 kg)      ASSESSMENT & PLAN

## 2011-12-22 NOTE — Assessment & Plan Note (Signed)
Patient had some CHF initially after his MI. This then stabilized and he said no recurrence. No further workup is needed.

## 2011-12-22 NOTE — Patient Instructions (Signed)
Continue all current medications. Your physician wants you to follow up in: 6 months.  You will receive a reminder letter in the mail one-two months in advance.  If you don't receive a letter, please call our office to schedule the follow up appointment   

## 2012-01-23 ENCOUNTER — Other Ambulatory Visit: Payer: Self-pay | Admitting: Cardiology

## 2012-02-20 ENCOUNTER — Other Ambulatory Visit: Payer: Self-pay | Admitting: Cardiology

## 2012-04-18 DIAGNOSIS — R079 Chest pain, unspecified: Secondary | ICD-10-CM

## 2012-04-21 ENCOUNTER — Other Ambulatory Visit: Payer: Self-pay | Admitting: Cardiology

## 2012-05-04 ENCOUNTER — Other Ambulatory Visit: Payer: Self-pay | Admitting: Physician Assistant

## 2012-05-04 ENCOUNTER — Ambulatory Visit (INDEPENDENT_AMBULATORY_CARE_PROVIDER_SITE_OTHER): Payer: Medicare Other | Admitting: Physician Assistant

## 2012-05-04 ENCOUNTER — Encounter: Payer: Self-pay | Admitting: Physician Assistant

## 2012-05-04 ENCOUNTER — Telehealth: Payer: Self-pay

## 2012-05-04 ENCOUNTER — Encounter: Payer: Self-pay | Admitting: *Deleted

## 2012-05-04 VITALS — BP 95/66 | HR 78 | Ht 69.0 in | Wt 221.0 lb

## 2012-05-04 DIAGNOSIS — I2510 Atherosclerotic heart disease of native coronary artery without angina pectoris: Secondary | ICD-10-CM

## 2012-05-04 DIAGNOSIS — I509 Heart failure, unspecified: Secondary | ICD-10-CM

## 2012-05-04 DIAGNOSIS — E785 Hyperlipidemia, unspecified: Secondary | ICD-10-CM

## 2012-05-04 DIAGNOSIS — R0602 Shortness of breath: Secondary | ICD-10-CM

## 2012-05-04 DIAGNOSIS — I959 Hypotension, unspecified: Secondary | ICD-10-CM | POA: Insufficient documentation

## 2012-05-04 DIAGNOSIS — I10 Essential (primary) hypertension: Secondary | ICD-10-CM

## 2012-05-04 NOTE — Telephone Encounter (Signed)
Lexiscan Myoview Echo -complete  Scheduled for 6-18 [facility] Checking percert

## 2012-05-04 NOTE — Assessment & Plan Note (Signed)
Will decrease lisinopril to 5 mg daily.

## 2012-05-04 NOTE — Assessment & Plan Note (Signed)
We'll order a 2-D echo for reassessment of LVF, most recently assessed at 45% (improved from 30%).

## 2012-05-04 NOTE — Assessment & Plan Note (Signed)
Will proceed with our recent recommendation for an outpatient Lexiscan Myoview to rule out ischemia. Most recent ischemic evaluation was by cardiac catheterization, 6/12, yielding moderate ISR of the CFX. Will have patient return for early followup for review of test results and further recommendations. If this study is suggestive of ischemia, then we will need to consider proceeding further with a relook cardiac catheterization. In the meanwhile, patient is to remain on Plavix, but we will decrease ASA to 81 mg daily.

## 2012-05-04 NOTE — Progress Notes (Signed)
HPI: Patient presents for post hospital followup from [facility], following admission with CP. He was referred to [place] in consultation, ruled out for MI with normal cardiac markers, and cleared for further evaluation as an outpatient.  Clinically, he denies recurrence of presenting symptoms of a "pounding" sensation in his chest. However, he continues to experience long-standing exertional chest pressure, with no exacerbation. He has not used NTG. He also suggests chronic fatigue/diminished energy and DOE. Recent hospitalization notable for absence of definite CHF by CXR or BNP level (8).  No Known Allergies  Current Outpatient Prescriptions  Medication Sig Dispense Refill  . ARIPiprazole (ABILIFY) 5 MG tablet Take 5 mg by mouth daily.        Marland Kitchen aspirin 325 MG tablet Take 325 mg by mouth daily.        Marland Kitchen atorvastatin (LIPITOR) 80 MG tablet TAKE ONE TABLET BY MOUTH EVERY DAY  30 tablet  1  . buPROPion (WELLBUTRIN XL) 300 MG 24 hr tablet Take 300 mg by mouth daily.        . carvedilol (COREG) 25 MG tablet TAKE ONE TABLET BY MOUTH TWICE DAILY  60 tablet  6  . citalopram (CELEXA) 20 MG tablet Take 20 mg by mouth daily.        . cyclobenzaprine (FLEXERIL) 10 MG tablet Take 10 mg by mouth 3 (three) times daily as needed.        . diazepam (VALIUM) 5 MG tablet Take 5 mg by mouth 3 (three) times daily.        Marland Kitchen doxepin (SINEQUAN) 75 MG capsule Take 75 mg by mouth daily.        . famotidine (PEPCID) 20 MG tablet Take 20 mg by mouth 2 (two) times daily.        Marland Kitchen lisinopril (PRINIVIL,ZESTRIL) 10 MG tablet TAKE ONE TABLET BY MOUTH EVERY DAY  30 tablet  6  . meloxicam (MOBIC) 15 MG tablet Take 15 mg by mouth daily.        . nitroGLYCERIN (NITROSTAT) 0.4 MG SL tablet Place 1 tablet (0.4 mg total) under the tongue every 5 (five) minutes as needed.  25 tablet  3  . PLAVIX 75 MG tablet TAKE ONE TABLET BY MOUTH EVERY DAY WITH FOOD  30 each  11    Past Medical History  Diagnosis Date  . 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 Ejection fraction < 50%     EF 30% echo November, 2011 (MI)  / EF 40%, echo, months after MI  /  EF 45%, cardiac catheterization, June, 201 to  . CHF (congestive heart failure)     Initially post MI, then improved.  . Depression   . Hepatitis C   . Hypertension   . History of cocaine abuse     in 80's, ex-IVDU  . History of alcohol abuse   . Pulmonary nodule   . Anal fissure     Past Surgical History  Procedure Date  . Cardiac catheterization     with PCI RCA 10/19/2010    History   Social History  . Marital Status: Married    Spouse Name: N/A    Number of Children: N/A  . Years of Education: N/A   Occupational History  . Not on file.   Social History Main Topics  .  Smoking status: Former Smoker -- 1.0 packs/day for 35 years    Types: Cigarettes    Quit date: 10/18/2010  . Smokeless tobacco: Never Used  . Alcohol Use: No     quit  . Drug Use: No  . Sexually Active: Not on file   Other Topics Concern  . Not on file   Social History Narrative  . No narrative on file    Family History  Problem Relation Age of Onset  . Coronary artery disease Father   . Cancer Father     ROS: no nausea, vomiting; no fever, chills; no melena, hematochezia; no claudication  PHYSICAL EXAM: BP 95/66  Pulse 78  Ht 5\' 9"  (1.753 m)  Wt 221 lb (100.245 kg)  BMI 32.64 kg/m2 GENERAL: 46 year-old male, sitting upright; NAD HEENT: NCAT, PERRLA, EOMI; sclera clear; no xanthelasma NECK: palpable bilateral carotid pulses, no bruits; no JVD; no TM LUNGS: CTA bilaterally CARDIAC: RRR (S1, S2); no significant murmurs; no rubs or gallops ABDOMEN: Protuberant EXTREMETIES: intact distal pulses; no significant peripheral edema SKIN: warm/dry; no obvious  rash/lesions MUSCULOSKELETAL: no joint deformity NEURO: no focal deficit; NL affect   EKG: reviewed and available in Electronic Records   ASSESSMENT & PLAN:  Coronary artery disease Will proceed with our recent recommendation for an outpatient Lexiscan Myoview to rule out ischemia. Most recent ischemic evaluation was by cardiac catheterization, 6/12, yielding moderate ISR of the CFX. Will have patient return for early followup for review of test results and further recommendations. If this study is suggestive of ischemia, then we will need to consider proceeding further with a relook cardiac catheterization. In the meanwhile, patient is to remain on Plavix, but we will decrease ASA to 81 mg daily.  CHF (congestive heart failure) We'll order a 2-D echo for reassessment of LVF, most recently assessed at 45% (improved from 30%).  DYSLIPIDEMIA We'll order FLP/LFT profile. Aggressive management recommended with target LDL 70 or less, if feasible. Continue high dose Lipitor, pending further recommendations.  Hypotension Will decrease lisinopril to 5 mg daily.    Gene Jakyrie Totherow, PAC

## 2012-05-04 NOTE — Assessment & Plan Note (Signed)
We'll order FLP/LFT profile. Aggressive management recommended with target LDL 70 or less, if feasible. Continue high dose Lipitor, pending further recommendations.

## 2012-05-04 NOTE — Patient Instructions (Signed)
Your physician recommends that you go to the [facility] lab for blood work for fasting lipid and liver panel.  Reminder:  Nothing to eat or drink after 12 midnight prior to labs.  Echo Lexiscan Myoview (stress test) Our office will notify of results Follow up in  1 month

## 2012-05-05 NOTE — Telephone Encounter (Signed)
Angel Donovan # [id number] exp 06/19/12  2D echo Auth # [id number] exp 06/19/12

## 2012-05-09 DIAGNOSIS — R072 Precordial pain: Secondary | ICD-10-CM

## 2012-05-09 DIAGNOSIS — I5023 Acute on chronic systolic (congestive) heart failure: Secondary | ICD-10-CM

## 2012-05-16 ENCOUNTER — Telehealth: Payer: Self-pay | Admitting: *Deleted

## 2012-05-16 DIAGNOSIS — E785 Hyperlipidemia, unspecified: Secondary | ICD-10-CM

## 2012-05-16 DIAGNOSIS — Z79899 Other long term (current) drug therapy: Secondary | ICD-10-CM

## 2012-05-16 NOTE — Telephone Encounter (Signed)
Patient informed and will break 80 mg tablets in 1/2. Order placed and faxed to [facility] lab and patient will pick up during office visit with Myrtis Ser on 06/28/12.

## 2012-05-16 NOTE — Telephone Encounter (Signed)
Left message for patient to call office.  

## 2012-05-16 NOTE — Telephone Encounter (Signed)
Message copied by Eustace Moore on Tue May 16, 2012  9:47 AM ------      Message from: Lesle Chris      Created: Mon May 15, 2012  8:56 AM                   ----- Message -----         From: Prescott Parma, PA         Sent: 05/10/2012  12:50 PM           To: Lesle Chris, LPN            LDL 32. Decrease Lipitor by 1/2 dose, to 40 mg daily. F/u FLP in 12 weeks

## 2012-05-24 ENCOUNTER — Telehealth: Payer: Self-pay | Admitting: *Deleted

## 2012-05-24 NOTE — Telephone Encounter (Addendum)
Message copied by Eustace Moore on Wed May 24, 2012  3:04 PM ------      Message from: Lesle Chris      Created: Tue May 16, 2012  3:05 PM       Myrtis Ser patient.              ----- Message -----         From: Prescott Parma, PA         Sent: 05/14/2012  12:02 PM           To: Lesle Chris, LPN            NL Lexiscan Myoview: EF 54%. Continue current medication regimen.

## 2012-05-24 NOTE — Telephone Encounter (Signed)
Patient's wife informed

## 2012-06-23 ENCOUNTER — Encounter: Payer: Self-pay | Admitting: Cardiology

## 2012-06-27 ENCOUNTER — Encounter: Payer: Self-pay | Admitting: Cardiology

## 2012-06-28 ENCOUNTER — Encounter: Payer: Self-pay | Admitting: Cardiology

## 2012-06-28 ENCOUNTER — Ambulatory Visit (INDEPENDENT_AMBULATORY_CARE_PROVIDER_SITE_OTHER): Payer: Medicare Other | Admitting: Cardiology

## 2012-06-28 VITALS — BP 128/70 | HR 68 | Ht 70.0 in | Wt 226.8 lb

## 2012-06-28 DIAGNOSIS — I2510 Atherosclerotic heart disease of native coronary artery without angina pectoris: Secondary | ICD-10-CM

## 2012-06-28 DIAGNOSIS — R9430 Abnormal result of cardiovascular function study, unspecified: Secondary | ICD-10-CM

## 2012-06-28 DIAGNOSIS — F17200 Nicotine dependence, unspecified, uncomplicated: Secondary | ICD-10-CM

## 2012-06-28 DIAGNOSIS — R0989 Other specified symptoms and signs involving the circulatory and respiratory systems: Secondary | ICD-10-CM

## 2012-06-28 NOTE — Patient Instructions (Signed)
Continue all current medications. Follow up in  3 months 

## 2012-06-28 NOTE — Assessment & Plan Note (Signed)
Recent ejection fraction data from his nuclear scan and his echo showed a binuclear his EF was 54%. By echo was 40-45% with focal wall motion abnormalities. This is stable when compared to prior studies. He has no evidence of heart failure. No further workup. We'll see him back in 3 months for followup.

## 2012-06-28 NOTE — Assessment & Plan Note (Signed)
Coronary disease is stable. His nuclear scan June 18, 203 revealed no ischemia. It was felt that he might have had some areas of attenuation. In comparing this to his echo these may well be areas of prior scar. However there was no ischemia. He is stable. No further workup is needed.

## 2012-06-28 NOTE — Assessment & Plan Note (Signed)
The patient is not smoking. He says that he stopped with his prior MI. I applauded him for this.

## 2012-06-28 NOTE — Progress Notes (Addendum)
HPI   Patient is seen today to followup coronary artery disease. He has known disease. He had undergone cardiac catheterization in June, 2012. He then was hospitalized at [facility] earlier 2013. He was then seen in the office for followup by Mr. Serpe on May 04, 2012. He seems stable. He was having some mild symptoms. There was plan to proceed with a Myoview scan and an echo. His cath in June 2 012 had shown moderate in-stent restenosis of the circumflex. A nuclear test was to be done to see if there was any ischemia. The study was done. There are some small fixed defects. There is no significant ischemia.  Patient also has had decreased LV function in the past. His EF had improved over time to 40 or 45%. His most recent echo was just repeated on  May 09, 2012. This showed an EF of 40-45% with focal wall motion abnormalities.  Overall the patient is stable. He has slight symptoms if he exerts too much.  No Known Allergies  Current Outpatient Prescriptions  Medication Sig Dispense Refill  . ARIPiprazole (ABILIFY) 5 MG tablet Take 5 mg by mouth daily.        Marland Kitchen aspirin 81 MG tablet Take 81 mg by mouth daily.      Marland Kitchen atorvastatin (LIPITOR) 80 MG tablet Take 40 mg by mouth daily. *dose decreased 05/16/12 per Gene* Repeat fasting labs around 08/16/12      . buPROPion (WELLBUTRIN XL) 300 MG 24 hr tablet Take 300 mg by mouth daily.        . carvedilol (COREG) 25 MG tablet TAKE ONE TABLET BY MOUTH TWICE DAILY  60 tablet  6  . citalopram (CELEXA) 20 MG tablet Take 20 mg by mouth daily.        . cyclobenzaprine (FLEXERIL) 10 MG tablet Take 10 mg by mouth 3 (three) times daily as needed.        . diazepam (VALIUM) 5 MG tablet Take 5 mg by mouth 3 (three) times daily.        Marland Kitchen doxepin (SINEQUAN) 75 MG capsule Take 75 mg by mouth daily.        . famotidine (PEPCID) 20 MG tablet Take 20 mg by mouth 2 (two) times daily.        Marland Kitchen lisinopril (PRINIVIL,ZESTRIL) 10 MG tablet TAKE ONE TABLET BY MOUTH EVERY DAY   30 tablet  6  . meloxicam (MOBIC) 15 MG tablet Take 15 mg by mouth daily.        . nitroGLYCERIN (NITROSTAT) 0.4 MG SL tablet Place 1 tablet (0.4 mg total) under the tongue every 5 (five) minutes as needed.  25 tablet  3  . PLAVIX 75 MG tablet TAKE ONE TABLET BY MOUTH EVERY DAY WITH FOOD  30 each  11  . PROAIR HFA 108 (90 BASE) MCG/ACT inhaler Inhale 1 puff into the lungs every 6 (six) hours as needed.         History   Social History  . Marital Status: Married    Spouse Name: N/A    Number of Children: N/A  . Years of Education: N/A   Occupational History  . Not on file.   Social History Main Topics  . Smoking status: Former Smoker -- 1.0 packs/day for 35 years    Types: Cigarettes    Quit date: 10/18/2010  . Smokeless tobacco: Never Used  . Alcohol Use: No     quit  . Drug Use: No  .  Sexually Active: Not on file   Other Topics Concern  . Not on file   Social History Narrative  . No narrative on file    Family History  Problem Relation Age of Onset  . Coronary artery disease Father   . Cancer Father     Past Medical History  Diagnosis Date  . 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 Ejection fraction < 50%     EF 30% echo November, 2011 (MI)  / EF 40%, echo, months after MI  /  EF 45%, cardiac catheterization, June, 201 to  . CHF (congestive heart failure)     Initially post MI, then improved.  . Depression   . Hepatitis C   . Hypertension   . History of cocaine abuse     in 80's, ex-IVDU  . History of alcohol abuse   . Pulmonary nodule   . Anal fissure     Past Surgical History  Procedure Date  . Cardiac catheterization     with PCI RCA 10/19/2010    ROS   Patient denies fever, chills, headache, sweats, rash, change in vision, change in hearing,  cough, nausea vomiting, urinary symptoms. All other systems are reviewed and are negative.  PHYSICAL EXAM  Patient is oriented to person time and place. Affect is normal. There is no jugulovenous distention. Lungs are clear. Respiratory effort is nonlabored. Cardiac exam reveals S1 and S2. There no clicks or significant murmurs. The abdomen is soft. There is no peripheral edema.  Filed Vitals:   06/28/12 1027  BP: 128/70  Pulse: 68  Height: 5\' 10"  (1.778 m)  Weight: 226 lb 12.8 oz (102.876 kg)     ASSESSMENT & PLAN

## 2012-07-23 ENCOUNTER — Other Ambulatory Visit: Payer: Self-pay | Admitting: Cardiology

## 2012-08-11 ENCOUNTER — Telehealth: Payer: Self-pay | Admitting: *Deleted

## 2012-08-11 DIAGNOSIS — Z79899 Other long term (current) drug therapy: Secondary | ICD-10-CM

## 2012-08-11 DIAGNOSIS — E785 Hyperlipidemia, unspecified: Secondary | ICD-10-CM

## 2012-08-11 MED ORDER — ATORVASTATIN CALCIUM 20 MG PO TABS: 20.0000 mg | ORAL_TABLET | Freq: Every day | ORAL | Status: DC

## 2012-08-11 NOTE — Telephone Encounter (Signed)
Patient informed and lab order faxed to [facility] lab. 

## 2012-08-11 NOTE — Telephone Encounter (Signed)
Message copied by Eustace Moore on Fri Aug 11, 2012  4:24 PM ------      Message from: Rande Brunt      Created: Fri Aug 11, 2012 12:21 PM       LDL 39. Decrease Lipitor from 80 to 40 daily, and FLP/LFT profile in 12 weeks

## 2012-08-23 ENCOUNTER — Other Ambulatory Visit: Payer: Self-pay | Admitting: Cardiology

## 2012-09-21 ENCOUNTER — Other Ambulatory Visit: Payer: Self-pay | Admitting: Cardiology

## 2012-09-21 NOTE — Telephone Encounter (Signed)
..   Requested Prescriptions   Pending Prescriptions Disp Refills  . lisinopril (PRINIVIL,ZESTRIL) 10 MG tablet [Pharmacy Med Name: LISINOPRIL 10MG      TAB] 30 tablet 4    Sig: TAKE ONE TABLET BY MOUTH EVERY DAY

## 2012-09-29 ENCOUNTER — Encounter: Payer: Self-pay | Admitting: Cardiology

## 2012-09-29 ENCOUNTER — Ambulatory Visit (INDEPENDENT_AMBULATORY_CARE_PROVIDER_SITE_OTHER): Payer: Medicare Other | Admitting: Cardiology

## 2012-09-29 VITALS — BP 124/86 | HR 70 | Ht 70.0 in | Wt 234.0 lb

## 2012-09-29 DIAGNOSIS — I2510 Atherosclerotic heart disease of native coronary artery without angina pectoris: Secondary | ICD-10-CM

## 2012-09-29 DIAGNOSIS — R42 Dizziness and giddiness: Secondary | ICD-10-CM

## 2012-09-29 DIAGNOSIS — I10 Essential (primary) hypertension: Secondary | ICD-10-CM

## 2012-09-29 DIAGNOSIS — I509 Heart failure, unspecified: Secondary | ICD-10-CM

## 2012-09-29 MED ORDER — FUROSEMIDE 20 MG PO TABS: 20.0000 mg | ORAL_TABLET | Freq: Every day | ORAL | Status: DC

## 2012-09-29 NOTE — Progress Notes (Signed)
Patient ID: Angel Donovan, male   DOB: [birth year], 46 y.o.   MRN: [id number]   HPI  Patient is seen to followup coronary artery disease. The patient has known disease. His EF in June, 2013 was 40-45%. His most recent nuclear scan did not suggest ischemia. He tells me today that he does have some exertional shortness of breath on walking back from his mailbox. He is not having obvious PND or orthopnea. He visiting nurse mentioned to him that he had some edema that he did not notice. He has some mild dizziness. It seems to be positional.  No Known Allergies  Current Outpatient Prescriptions  Medication Sig Dispense Refill  . ARIPiprazole (ABILIFY) 5 MG tablet Take 5 mg by mouth daily.        Marland Kitchen aspirin 81 MG tablet Take 81 mg by mouth daily.      Marland Kitchen atorvastatin (LIPITOR) 20 MG tablet Take 1 tablet (20 mg total) by mouth daily.  30 tablet  6  . buPROPion (WELLBUTRIN XL) 300 MG 24 hr tablet Take 300 mg by mouth daily.        . carvedilol (COREG) 25 MG tablet TAKE ONE TABLET BY MOUTH TWICE DAILY  60 tablet  6  . citalopram (CELEXA) 20 MG tablet Take 20 mg by mouth daily.        . cyclobenzaprine (FLEXERIL) 10 MG tablet Take 10 mg by mouth 3 (three) times daily as needed.        . diazepam (VALIUM) 5 MG tablet Take 5 mg by mouth 3 (three) times daily.        Marland Kitchen doxepin (SINEQUAN) 75 MG capsule Take 75 mg by mouth daily.        . famotidine (PEPCID) 20 MG tablet Take 20 mg by mouth 2 (two) times daily.        Marland Kitchen lisinopril (PRINIVIL,ZESTRIL) 10 MG tablet TAKE ONE TABLET BY MOUTH EVERY DAY  30 tablet  4  . meloxicam (MOBIC) 15 MG tablet Take 15 mg by mouth daily.        . nitroGLYCERIN (NITROSTAT) 0.4 MG SL tablet Place 1 tablet (0.4 mg total) under the tongue every 5 (five) minutes as needed.  25 tablet  3  . PLAVIX 75 MG tablet TAKE ONE TABLET BY MOUTH EVERY DAY WITH FOOD  30 each  11  . PROAIR HFA 108 (90 BASE) MCG/ACT inhaler Inhale 1 puff into the lungs every 6 (six) hours as needed.       Marland Kitchen  SPIRIVA HANDIHALER 18 MCG inhalation capsule Place 1 capsule into inhaler and inhale Daily.        History   Social History  . Marital Status: Married    Spouse Name: N/A    Number of Children: N/A  . Years of Education: N/A   Occupational History  . Not on file.   Social History Main Topics  . Smoking status: Former Smoker -- 1.0 packs/day for 35 years    Types: Cigarettes    Quit date: 10/18/2010  . Smokeless tobacco: Never Used  . Alcohol Use: No     Comment: quit  . Drug Use: No  . Sexually Active: Not on file   Other Topics Concern  . Not on file   Social History Narrative  . No narrative on file    Family History  Problem Relation Age of Onset  . Coronary artery disease Father   . Cancer Father     Past Medical History  Diagnosis Date  . 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 Ejection fraction < 50%     EF 30% echo November, 2011 (MI)  / EF 40%, echo, months after MI  /  EF 45%, cardiac catheterization, June, 201 to  . CHF (congestive heart failure)     Initially post MI, then improved.  . Depression   . Hepatitis C   . Hypertension   . History of cocaine abuse     in 80's, ex-IVDU  . History of alcohol abuse   . Pulmonary nodule   . Anal fissure     Past Surgical History  Procedure Date  . Cardiac catheterization     with PCI RCA 10/19/2010    Patient Active Problem List  Diagnosis  . HEPATITIS C  . VERRUCA VULGARIS  . DYSLIPIDEMIA  . ABUSE, ALCOHOL, IN REMISSION  . TOBACCO USE  . ABUSE, COCAINE, IN REMISSION  . DEPRESSION  . TINNITUS, LEFT  . HYPERTENSION  . PULMONARY NODULE  . DYSPEPSIA  . ANAL FISSURE  . KERATODERMA, ACQUIRED  . HIP PAIN, RIGHT  . DYSURIA  . Nonspecific (abnormal) findings on radiological and other  examination of body structure  . ABNORMAL RADIOLOGIAL EXAMINATION  . Coronary artery disease  . Ejection fraction < 50%  . CHF (congestive heart failure)  . Hypotension    ROS   Patient denies fever, chills, headache, sweats, rash, change in vision, change in hearing, chest pain, cough, nausea vomiting, urinary symptoms. All other systems are reviewed and are negative.  PHYSICAL EXAM  Patient is oriented to person time and place. Affect is normal. There is no jugulovenous distention. Lungs are clear to. Respiratory effort is nonlabored. Cardiac exam reveals S1 and S2. There no clicks or significant murmurs. The abdomen is soft. There is no significant peripheral edema today. There no musculoskeletal deformities. There are no skin rashes.  Filed Vitals:   09/29/12 1031  BP: 124/86  Pulse: 70  Height: 5\' 10"  (1.778 m)  Weight: 234 lb (106.142 kg)  SpO2: 99%     ASSESSMENT & PLAN

## 2012-09-29 NOTE — Assessment & Plan Note (Signed)
Blood pressure is well controlled. No change in therapy. 

## 2012-09-29 NOTE — Assessment & Plan Note (Signed)
Coronary disease is stable. It is possible that his shortness of breath is ischemic in origin. He had a nuclear scan in June, 2013, this revealed no ischemia. No further ischemic workup at this time. However when the patient has significant shortness of breath when walking I have given him permission to sit down and try taking a nitroglycerin to see if this helps. He will report back to me when I see her for followup.

## 2012-09-29 NOTE — Assessment & Plan Note (Signed)
The patient had some mild CHF originally. Because some edema has been noted at times I will try very small dose of Lasix 20 mg. I will see him for early followup to see how he is feeling with this.

## 2012-09-29 NOTE — Patient Instructions (Addendum)
Your physician recommends that you schedule a follow-up appointment in: 3 weeks. Your physician has recommended you make the following change in your medication: Start furosemide 20 mg daily. Your new prescription has been sent to your pharmacy. All other medications will remain the same.

## 2012-09-29 NOTE — Assessment & Plan Note (Signed)
This is a new complaint. He does not appear to be hypotensive or orthostatic at this time. We will be very careful with this since we're beginning a small dose of a diuretic. I'll see him for followup.

## 2012-10-27 ENCOUNTER — Ambulatory Visit (INDEPENDENT_AMBULATORY_CARE_PROVIDER_SITE_OTHER): Payer: Medicare Other | Admitting: Cardiology

## 2012-10-27 ENCOUNTER — Telehealth: Payer: Self-pay | Admitting: *Deleted

## 2012-10-27 ENCOUNTER — Encounter: Payer: Self-pay | Admitting: Cardiology

## 2012-10-27 VITALS — BP 109/76 | HR 72 | Ht 70.0 in | Wt 230.0 lb

## 2012-10-27 DIAGNOSIS — E785 Hyperlipidemia, unspecified: Secondary | ICD-10-CM

## 2012-10-27 DIAGNOSIS — R42 Dizziness and giddiness: Secondary | ICD-10-CM

## 2012-10-27 DIAGNOSIS — I2510 Atherosclerotic heart disease of native coronary artery without angina pectoris: Secondary | ICD-10-CM

## 2012-10-27 DIAGNOSIS — K3189 Other diseases of stomach and duodenum: Secondary | ICD-10-CM

## 2012-10-27 DIAGNOSIS — R1013 Epigastric pain: Secondary | ICD-10-CM

## 2012-10-27 NOTE — Patient Instructions (Addendum)
Your physician recommends that you schedule a follow-up appointment in: 6 months. You will receive a reminder letter in the mail in about 4 months reminding you to call and schedule your appointment. If you don't receive this letter, please contact our office.  Your physician recommends that you continue on your current medications as directed. Please refer to the Current Medication list given to you today.  Your physician recommends that you return for lab work today at [facility] for BMET.

## 2012-10-27 NOTE — Assessment & Plan Note (Signed)
Coronary disease is stable. No change in therapy. 

## 2012-10-27 NOTE — Assessment & Plan Note (Signed)
The dizziness is mild and stable. No change in therapy.

## 2012-10-27 NOTE — Assessment & Plan Note (Signed)
His dyspnea seems to be a little better with a small amount of diuresis. Chemistry lab be will be checked. No other change in therapy.

## 2012-10-27 NOTE — Telephone Encounter (Signed)
Left message for patient to call office to remind him that he still needed to have his FLP/LFT next week.LA

## 2012-10-27 NOTE — Telephone Encounter (Signed)
Patient reminded to get fasting labs.

## 2012-10-27 NOTE — Progress Notes (Signed)
HPI  Patient is seen to followup mild fluid overload. I saw him last September 29, 2012. He does have known coronary disease. He has mild decrease in LV function. I added 20 mg of Lasix daily. His weight is down 4 pounds. He says that his exertional shortness of breath is somewhat improved.  No Known Allergies  Current Outpatient Prescriptions  Medication Sig Dispense Refill  . ARIPiprazole (ABILIFY) 5 MG tablet Take 5 mg by mouth daily.        Marland Kitchen aspirin 81 MG tablet Take 81 mg by mouth daily.      Marland Kitchen atorvastatin (LIPITOR) 20 MG tablet Take 1 tablet (20 mg total) by mouth daily.  30 tablet  6  . buPROPion (WELLBUTRIN XL) 300 MG 24 hr tablet Take 300 mg by mouth daily.        . carvedilol (COREG) 25 MG tablet TAKE ONE TABLET BY MOUTH TWICE DAILY  60 tablet  6  . citalopram (CELEXA) 20 MG tablet Take 20 mg by mouth daily.        . cyclobenzaprine (FLEXERIL) 10 MG tablet Take 10 mg by mouth 3 (three) times daily as needed.        . diazepam (VALIUM) 5 MG tablet Take 5 mg by mouth 3 (three) times daily.        Marland Kitchen doxepin (SINEQUAN) 75 MG capsule Take 75 mg by mouth daily.        . famotidine (PEPCID) 20 MG tablet Take 20 mg by mouth 2 (two) times daily.        . furosemide (LASIX) 20 MG tablet Take 1 tablet (20 mg total) by mouth daily.  90 tablet  3  . lisinopril (PRINIVIL,ZESTRIL) 10 MG tablet TAKE ONE TABLET BY MOUTH EVERY DAY  30 tablet  4  . meloxicam (MOBIC) 15 MG tablet Take 15 mg by mouth daily.        . nitroGLYCERIN (NITROSTAT) 0.4 MG SL tablet Place 1 tablet (0.4 mg total) under the tongue every 5 (five) minutes as needed.  25 tablet  3  . PLAVIX 75 MG tablet TAKE ONE TABLET BY MOUTH EVERY DAY WITH FOOD  30 each  11  . PROAIR HFA 108 (90 BASE) MCG/ACT inhaler Inhale 1 puff into the lungs every 6 (six) hours as needed.       Marland Kitchen SPIRIVA HANDIHALER 18 MCG inhalation capsule Place 1 capsule into inhaler and inhale Daily.        History   Social History  . Marital Status: Married   Spouse Name: N/A    Number of Children: N/A  . Years of Education: N/A   Occupational History  . Not on file.   Social History Main Topics  . Smoking status: Former Smoker -- 1.0 packs/day for 35 years    Types: Cigarettes    Quit date: 10/18/2010  . Smokeless tobacco: Never Used  . Alcohol Use: No     Comment: quit  . Drug Use: No  . Sexually Active: Not on file   Other Topics Concern  . Not on file   Social History Narrative  . No narrative on file    Family History  Problem Relation Age of Onset  . Coronary artery disease Father   . Cancer Father     Past Medical History  Diagnosis Date  . 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 Ejection fraction < 50%     EF 30% echo November, 2011 (MI)  / EF 40%, echo, months after MI  /  EF 45%, cardiac catheterization, June, 201 to  . CHF (congestive heart failure)     Initially post MI, then improved.  . Depression   . Hepatitis C   . Hypertension   . History of cocaine abuse     in 80's, ex-IVDU  . History of alcohol abuse   . Pulmonary nodule   . Anal fissure   . Dizziness     November, 2013    Past Surgical History  Procedure Date  . Cardiac catheterization     with PCI RCA 10/19/2010    Patient Active Problem List  Diagnosis  . HEPATITIS C  . VERRUCA VULGARIS  . DYSLIPIDEMIA  . ABUSE, ALCOHOL, IN REMISSION  . TOBACCO USE  . ABUSE, COCAINE, IN REMISSION  . DEPRESSION  . TINNITUS, LEFT  . HYPERTENSION  . PULMONARY NODULE  . DYSPEPSIA  . ANAL FISSURE  . KERATODERMA, ACQUIRED  . HIP PAIN, RIGHT  . DYSURIA  . Nonspecific (abnormal) findings on radiological and other examination of body structure  . ABNORMAL RADIOLOGIAL EXAMINATION  . Coronary artery disease  . Ejection fraction < 50%  . CHF  (congestive heart failure)  . Hypotension  . Dizziness    ROS   Patient denies fever, chills, headache, sweats, rash, change in vision, change in hearing, chest pain, cough, nausea vomiting, urinary symptoms. He does have anxiety. This is a chronic problem. All other systems are reviewed and are negative.  PHYSICAL EXAM   Patient is stable. He is oriented to person time and place. Affect is normal. There is no jugulovenous distention. Lungs are clear. Respiratory effort is nonlabored. Cardiac exam reveals S1 and S2. There no clicks or significant murmurs. The abdomen is soft. He has no peripheral edema.  Filed Vitals:   10/27/12 1426  BP: 109/76  Pulse: 72  Height: 5\' 10"  (1.778 m)  Weight: 230 lb (104.327 kg)     ASSESSMENT & PLAN

## 2012-10-31 ENCOUNTER — Telehealth: Payer: Self-pay | Admitting: *Deleted

## 2012-10-31 DIAGNOSIS — I10 Essential (primary) hypertension: Secondary | ICD-10-CM

## 2012-10-31 NOTE — Telephone Encounter (Signed)
Message copied by Eustace Moore on Tue Oct 31, 2012  9:01 AM ------      Message from: Myrtis Ser, [place] D      Created: Mon Oct 30, 2012  1:03 PM       Please let the patient know that his recent lab was okay. There was very slight increase in the creatinine. This needs to be repeated with a BMet in 6 weeks

## 2012-10-31 NOTE — Telephone Encounter (Signed)
Patient informed and lab order faxed to [facility] lab. 

## 2012-11-18 ENCOUNTER — Other Ambulatory Visit: Payer: Self-pay | Admitting: Cardiovascular Disease

## 2012-11-28 ENCOUNTER — Telehealth: Payer: Self-pay | Admitting: *Deleted

## 2012-11-28 NOTE — Telephone Encounter (Signed)
Called patient and reminded him of the lab work and patient states he is going in the morning and will come by office to pick up lab orders to take with him to lab. Lab orders left up front for patient pick up.

## 2012-12-11 ENCOUNTER — Telehealth: Payer: Self-pay | Admitting: Cardiology

## 2012-12-11 NOTE — Telephone Encounter (Signed)
Message copied by Burnice Logan on Mon Dec 11, 2012  4:12 PM ------      Message from: [place], [place] D      Created: Sun Dec 10, 2012 12:17 PM       Labs from January 8 shows that renal function is stable, potassium is stable, cholesterol is good. No change in therapy

## 2012-12-11 NOTE — Telephone Encounter (Signed)
Pt informed

## 2012-12-11 NOTE — Telephone Encounter (Signed)
Message copied by Burnice Logan on Mon Dec 11, 2012  4:11 PM ------      Message from: [place], [place] D      Created: Sun Dec 10, 2012 12:54 PM       Labs look good

## 2013-02-28 ENCOUNTER — Other Ambulatory Visit: Payer: Self-pay | Admitting: Cardiology

## 2013-02-28 MED ORDER — LISINOPRIL 10 MG PO TABS: 10.0000 mg | ORAL_TABLET | Freq: Every day | ORAL | Status: DC

## 2013-03-09 ENCOUNTER — Other Ambulatory Visit: Payer: Self-pay | Admitting: *Deleted

## 2013-03-09 MED ORDER — ATORVASTATIN CALCIUM 20 MG PO TABS: 20.0000 mg | ORAL_TABLET | Freq: Every day | ORAL | Status: DC

## 2013-03-26 ENCOUNTER — Other Ambulatory Visit: Payer: Self-pay | Admitting: *Deleted

## 2013-03-26 MED ORDER — CARVEDILOL 25 MG PO TABS: ORAL_TABLET | ORAL | Status: DC

## 2013-04-27 ENCOUNTER — Ambulatory Visit (INDEPENDENT_AMBULATORY_CARE_PROVIDER_SITE_OTHER): Payer: Medicare Other | Admitting: Cardiology

## 2013-04-27 ENCOUNTER — Encounter: Payer: Self-pay | Admitting: Cardiology

## 2013-04-27 VITALS — BP 122/82 | HR 76 | Ht 70.0 in | Wt 224.4 lb

## 2013-04-27 DIAGNOSIS — I5042 Chronic combined systolic (congestive) and diastolic (congestive) heart failure: Secondary | ICD-10-CM

## 2013-04-27 DIAGNOSIS — E785 Hyperlipidemia, unspecified: Secondary | ICD-10-CM

## 2013-04-27 DIAGNOSIS — I509 Heart failure, unspecified: Secondary | ICD-10-CM

## 2013-04-27 DIAGNOSIS — R42 Dizziness and giddiness: Secondary | ICD-10-CM

## 2013-04-27 DIAGNOSIS — I2510 Atherosclerotic heart disease of native coronary artery without angina pectoris: Secondary | ICD-10-CM

## 2013-04-27 DIAGNOSIS — R0989 Other specified symptoms and signs involving the circulatory and respiratory systems: Secondary | ICD-10-CM

## 2013-04-27 NOTE — Assessment & Plan Note (Signed)
The patient continues to have some dizziness. There is also question of a soft carotid bruit. I have reviewed the records carefully. There has never been a carotid Doppler done. This will be scheduled and I will be in touch with him.

## 2013-04-27 NOTE — Assessment & Plan Note (Signed)
His volume status is stable. No change in therapy. I did check labs after I saw him in December. His creatinine was stable at 1.2. Potassium is 4.2.

## 2013-04-27 NOTE — Assessment & Plan Note (Signed)
Coronary disease is stable. Nuclear scan June, 2013 revealed no ischemia. EF was 54% at that time. At this time there is no proof that his exertional fatigue is ischemia. I've chosen not to proceed with exercise testing. He does have in stent restenosis in his circumflex from the past. This was moderate. I've chosen to keep him on Plavix along with aspirin at this point.

## 2013-04-27 NOTE — Patient Instructions (Addendum)
Your physician recommends that you schedule a follow-up appointment in: 6 months. You will receive a reminder letter in the mail in about 4 months reminding you to call and schedule your appointment. If you don't receive this letter, please contact our office. Your physician recommends that you continue on your current medications as directed. Please refer to the Current Medication list given to you today. Your physician has requested that you have a carotid duplex. This test is an ultrasound of the carotid arteries in your neck. It looks at blood flow through these arteries that supply the brain with blood. Allow one hour for this exam. There are no restrictions or special instructions.  

## 2013-04-27 NOTE — Progress Notes (Signed)
HPI  Patient is seen for cardiology followup. He has coronary disease and has been stented in the past. He has mild left ventricular dysfunction. He is on appropriate medications and has been stable. He says that when he tries to walk he does have fatigue. He will also have some dizziness. The dizziness is not new. He has not had syncope or presyncope. He's not having any chest pain. He has not had any recurrent edema.  No Known Allergies  Current Outpatient Prescriptions  Medication Sig Dispense Refill  . ARIPiprazole (ABILIFY) 5 MG tablet Take 5 mg by mouth daily.        Marland Kitchen aspirin 81 MG tablet Take 81 mg by mouth daily.      Marland Kitchen atorvastatin (LIPITOR) 20 MG tablet Take 1 tablet (20 mg total) by mouth daily.  30 tablet  6  . buPROPion (WELLBUTRIN XL) 300 MG 24 hr tablet Take 300 mg by mouth daily.        . carvedilol (COREG) 25 MG tablet TAKE ONE TABLET BY MOUTH TWICE DAILY  60 tablet  6  . citalopram (CELEXA) 40 MG tablet Take 40 mg by mouth daily.      . clopidogrel (PLAVIX) 75 MG tablet TAKE ONE TABLET BY MOUTH EVERY DAY WITH FOOD  30 tablet  11  . diazepam (VALIUM) 5 MG tablet Take 5 mg by mouth 3 (three) times daily.        Marland Kitchen doxepin (SINEQUAN) 150 MG capsule Take 150 mg by mouth at bedtime.      . famotidine (PEPCID) 20 MG tablet Take 20 mg by mouth 2 (two) times daily.        . furosemide (LASIX) 20 MG tablet Take 1 tablet (20 mg total) by mouth daily.  90 tablet  3  . lisinopril (PRINIVIL,ZESTRIL) 10 MG tablet Take 1 tablet (10 mg total) by mouth daily.  30 tablet  4  . meloxicam (MOBIC) 15 MG tablet Take 15 mg by mouth daily.        . nitroGLYCERIN (NITROSTAT) 0.4 MG SL tablet Place 1 tablet (0.4 mg total) under the tongue every 5 (five) minutes as needed.  25 tablet  3  . PROAIR HFA 108 (90 BASE) MCG/ACT inhaler Inhale 1 puff into the lungs every 6 (six) hours as needed.       Marland Kitchen SPIRIVA HANDIHALER 18 MCG inhalation capsule Place 1 capsule into inhaler and inhale Daily.      Marland Kitchen  tiZANidine (ZANAFLEX) 4 MG tablet Take 4 mg by mouth at bedtime.       No current facility-administered medications for this visit.    History   Social History  . Marital Status: Married    Spouse Name: N/A    Number of Children: N/A  . Years of Education: N/A   Occupational History  . Not on file.   Social History Main Topics  . Smoking status: Former Smoker -- 1.00 packs/day for 35 years    Types: Cigarettes    Quit date: 10/18/2010  . Smokeless tobacco: Never Used  . Alcohol Use: No     Comment: quit  . Drug Use: No  . Sexually Active: Not on file   Other Topics Concern  . Not on file   Social History Narrative  . No narrative on file    Family History  Problem Relation Age of Onset  . Coronary artery disease Father   . Cancer Father     Past Medical History  Diagnosis Date  . 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 Ejection fraction < 50%     EF 30% echo November, 2011 (MI)  / EF 40%, echo, months after MI  /  EF 45%, cardiac catheterization, June, 201 to  . CHF (congestive heart failure)     Initially post MI, then improved.  . Depression   . Hepatitis C   . Hypertension   . History of cocaine abuse     in 80's, ex-IVDU  . History of alcohol abuse   . Pulmonary nodule   . Anal fissure   . Dizziness     November, 2013    Past Surgical History  Procedure Laterality Date  . Cardiac catheterization      with PCI RCA 10/19/2010    Patient Active Problem List   Diagnosis Date Noted  . Dizziness   . Hypotension 05/04/2012  . Coronary artery disease   . Ejection fraction < 50%   . CHF (congestive heart failure)   . DYSLIPIDEMIA 01/11/2008  . 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 ANAL FISSURE 01/10/2008  . Nonspecific (abnormal) findings on  radiological and other examination of body structure 11/28/2007  . ABNORMAL RADIOLOGIAL EXAMINATION 11/28/2007  . PULMONARY NODULE 09/08/2007  . HIP PAIN, RIGHT 07/28/2007  . 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 KERATODERMA, ACQUIRED 04/12/2007  . DYSURIA 04/12/2007  . TOBACCO USE 02/21/2007  . DYSPEPSIA 02/21/2007  . HEPATITIS C 12/19/2006  . ABUSE, ALCOHOL, IN REMISSION 12/19/2006  . ABUSE, COCAINE, IN REMISSION 12/19/2006  . DEPRESSION 12/19/2006  . HYPERTENSION 12/19/2006    ROS   Patient denies fever, chills, headache, sweats, rash, change in vision, change in hearing, chest pain, cough, nausea vomiting, urinary symptoms. All other systems are reviewed and are negative.  PHYSICAL EXAM  Patient is oriented to person time and place. Affect is normal for him. He has a nervous shaking of his left leg today. There is no jugulovenous distention. Lungs are clear. Respiratory effort is nonlabored. There is question of a soft carotid bruit. Cardiac exam reveals S1 and S2. There are no clicks or significant murmurs. The abdomen is soft. There is no peripheral edema.  Filed Vitals:   04/27/13 1022  BP: 122/82  Pulse: 76  Height: 5\' 10"  (1.778 m)  Weight: 224 lb 6.4 oz (101.787 kg)  SpO2: 98%     ASSESSMENT & PLAN

## 2013-04-27 NOTE — Assessment & Plan Note (Signed)
His volume status is stable. No change in therapy. 

## 2013-04-27 NOTE — Assessment & Plan Note (Signed)
The patient is on medications and his recent labs in December were quite good. No change in therapy.

## 2013-05-02 ENCOUNTER — Encounter (INDEPENDENT_AMBULATORY_CARE_PROVIDER_SITE_OTHER): Payer: Medicare Other

## 2013-05-02 DIAGNOSIS — R0989 Other specified symptoms and signs involving the circulatory and respiratory systems: Secondary | ICD-10-CM

## 2013-05-02 DIAGNOSIS — I6529 Occlusion and stenosis of unspecified carotid artery: Secondary | ICD-10-CM

## 2013-05-03 ENCOUNTER — Encounter: Payer: Self-pay | Admitting: Cardiology

## 2013-05-03 DIAGNOSIS — I739 Peripheral vascular disease, unspecified: Secondary | ICD-10-CM

## 2013-05-04 ENCOUNTER — Telehealth: Payer: Self-pay | Admitting: *Deleted

## 2013-05-04 NOTE — Telephone Encounter (Signed)
Message copied by Eustace Moore on Fri May 04, 2013  9:48 AM ------      Message from: Myrtis Ser, [place] D      Created: Thu May 03, 2013 12:52 PM       Please let the patient know at the carotid Doppler reveals very mild carotid disease. This is something that can be very carefully followed over time. Nothing else to be done at this time. ------

## 2013-05-09 NOTE — Telephone Encounter (Signed)
Patient informed. 

## 2013-05-09 NOTE — Telephone Encounter (Signed)
Patient called but you were with a patient. He said he will call back

## 2013-07-24 ENCOUNTER — Other Ambulatory Visit: Payer: Self-pay | Admitting: Cardiology

## 2013-07-24 MED ORDER — LISINOPRIL 10 MG PO TABS: 10.0000 mg | ORAL_TABLET | Freq: Every day | ORAL | Status: DC

## 2013-10-15 ENCOUNTER — Other Ambulatory Visit: Payer: Self-pay | Admitting: Cardiology

## 2013-10-15 MED ORDER — FUROSEMIDE 20 MG PO TABS: 20.0000 mg | ORAL_TABLET | Freq: Every day | ORAL | Status: DC

## 2013-10-15 MED ORDER — ATORVASTATIN CALCIUM 20 MG PO TABS: 20.0000 mg | ORAL_TABLET | Freq: Every day | ORAL | Status: DC

## 2013-10-16 ENCOUNTER — Other Ambulatory Visit: Payer: Self-pay | Admitting: Cardiology

## 2013-10-16 MED ORDER — CARVEDILOL 25 MG PO TABS: ORAL_TABLET | ORAL | Status: DC

## 2013-11-13 ENCOUNTER — Encounter: Payer: Self-pay | Admitting: Cardiology

## 2013-11-13 ENCOUNTER — Ambulatory Visit (INDEPENDENT_AMBULATORY_CARE_PROVIDER_SITE_OTHER): Payer: Medicare Other | Admitting: Cardiology

## 2013-11-13 ENCOUNTER — Other Ambulatory Visit: Payer: Self-pay | Admitting: *Deleted

## 2013-11-13 VITALS — BP 139/78 | HR 80 | Ht 70.0 in | Wt 225.0 lb

## 2013-11-13 DIAGNOSIS — R42 Dizziness and giddiness: Secondary | ICD-10-CM

## 2013-11-13 DIAGNOSIS — I509 Heart failure, unspecified: Secondary | ICD-10-CM

## 2013-11-13 DIAGNOSIS — I10 Essential (primary) hypertension: Secondary | ICD-10-CM

## 2013-11-13 DIAGNOSIS — I5042 Chronic combined systolic (congestive) and diastolic (congestive) heart failure: Secondary | ICD-10-CM

## 2013-11-13 DIAGNOSIS — I2510 Atherosclerotic heart disease of native coronary artery without angina pectoris: Secondary | ICD-10-CM

## 2013-11-13 DIAGNOSIS — R55 Syncope and collapse: Secondary | ICD-10-CM

## 2013-11-13 NOTE — Assessment & Plan Note (Signed)
His coronary status is stable. No further workup.

## 2013-11-13 NOTE — Assessment & Plan Note (Signed)
The patient spells of dizziness and presyncope or concerning. He does not appear to be orthostatic at this time. Some of his medications may play a role. There is no cardiac basis to change his cardiac medicines at this time. Some of his episodes have been sudden. We need to rule out an arrhythmia. I arranged for him to wear an event recorder. I've carefully reviewed old records to be sure we have not done this in the recent past.  As part of today's evaluation I spent greater than 25 minutes with is total care. More than half of this time is been with direct contact with him talking with him about all of his symptoms.

## 2013-11-13 NOTE — Patient Instructions (Signed)
Your physician recommends that you schedule a follow-up appointment in: February 2015. Your physician recommends that you continue on your current medications as directed. Please refer to the Current Medication list given to you today. Your physician recommends that you have lab work today to check your BMET,CBC, and TSH. Your physician has recommended that you wear an event monitor. Event monitors are medical devices that record the heart's electrical activity. Doctors most often [place] these monitors to diagnose arrhythmias. Arrhythmias are problems with the speed or rhythm of the heartbeat. The monitor is a small, portable device. You can wear one while you do your normal daily activities. This is usually used to diagnose what is causing palpitations/syncope (passing out). [employer] will contact you about this monitor.

## 2013-11-13 NOTE — Assessment & Plan Note (Signed)
Blood pressure is controlled. He does not appear to have significant orthostatic blood pressure changes.

## 2013-11-13 NOTE — Assessment & Plan Note (Signed)
The patient's volume status is stable. No change in therapy. He is on a diuretic. It is time to check his renal function.

## 2013-11-13 NOTE — Progress Notes (Signed)
HPI  Patient is seen today to followup coronary disease and mild left ventricular dysfunction. I saw him before he had some dizziness. This has gotten worse. He now has several presyncopal episodes and possibly some true syncope. He notes that there is question that some of his antidepressant medicines could be playing a role. We have not documented significant orthostatic abnormalities. Sometimes he has warning before spell. Sometimes this comes on very suddenly.  At the time of his last visit I arrange for him to have carotid Dopplers. The study was done. It shows only mild carotid disease. This is not the basis of his dizzy spells.  No Known Allergies  Current Outpatient Prescriptions  Medication Sig Dispense Refill  . ARIPiprazole (ABILIFY) 5 MG tablet Take 5 mg by mouth daily.        Marland Kitchen aspirin 81 MG tablet Take 81 mg by mouth daily.      Marland Kitchen atorvastatin (LIPITOR) 20 MG tablet Take 1 tablet (20 mg total) by mouth daily.  30 tablet  6  . benazepril-hydrochlorthiazide (LOTENSIN HCT) 20-25 MG per tablet Take 1 tablet by mouth daily.       Marland Kitchen buPROPion (WELLBUTRIN XL) 300 MG 24 hr tablet Take 300 mg by mouth daily.        . carvedilol (COREG) 25 MG tablet TAKE ONE TABLET BY MOUTH TWICE DAILY  60 tablet  6  . clopidogrel (PLAVIX) 75 MG tablet TAKE ONE TABLET BY MOUTH EVERY DAY WITH FOOD  30 tablet  11  . diazepam (VALIUM) 5 MG tablet Take 5 mg by mouth 3 (three) times daily.        Marland Kitchen doxepin (SINEQUAN) 150 MG capsule Take 150 mg by mouth at bedtime.      . famotidine (PEPCID) 20 MG tablet Take 20 mg by mouth 2 (two) times daily.        Marland Kitchen FLUoxetine (PROZAC) 40 MG capsule Take 40 mg by mouth daily.       . furosemide (LASIX) 20 MG tablet Take 1 tablet (20 mg total) by mouth daily.  90 tablet  3  . meloxicam (MOBIC) 15 MG tablet Take 15 mg by mouth daily.        . nitroGLYCERIN (NITROSTAT) 0.4 MG SL tablet Place 1 tablet (0.4 mg total) under the tongue every 5 (five) minutes as needed.  25  tablet  3  . Omega-3 Fatty Acids (FISH OIL) 1000 MG CAPS Take 1,000 mg by mouth 3 (three) times daily.      Marland Kitchen PROAIR HFA 108 (90 BASE) MCG/ACT inhaler Inhale 1 puff into the lungs every 6 (six) hours as needed.       Marland Kitchen tiZANidine (ZANAFLEX) 4 MG tablet Take 4 mg by mouth at bedtime.      Marland Kitchen lisinopril (PRINIVIL,ZESTRIL) 10 MG tablet Take 1 tablet (10 mg total) by mouth daily.  30 tablet  6   No current facility-administered medications for this visit.    History   Social History  . Marital Status: Married    Spouse Name: N/A    Number of Children: N/A  . Years of Education: N/A   Occupational History  . Not on file.   Social History Main Topics  . Smoking status: Former Smoker -- 1.00 packs/day for 35 years    Types: Cigarettes    Quit date: 10/18/2010  . Smokeless tobacco: Never Used  . Alcohol Use: No     Comment: quit  . Drug Use: No  .  Sexual Activity: Not on file   Other Topics Concern  . Not on file   Social History Narrative  . No narrative on file    Family History  Problem Relation Age of Onset  . Coronary artery disease Father   . Cancer Father     Past Medical History  Diagnosis Date  . 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 Ejection fraction < 50%     EF 30% echo November, 2011 (MI)  / EF 40%, echo, months after MI  /  EF 45%, cardiac catheterization, June, 201 to  . CHF (congestive heart failure)     Initially post MI, then improved.  . Depression   . Hepatitis C   . Hypertension   . History of cocaine abuse     in 80's, ex-IVDU  . History of alcohol abuse   . Pulmonary nodule   . Anal fissure   . Dizziness     November, 2013  . Carotid artery disease     Doppler, June, 2014, 1-39% bilateral    Past Surgical History  Procedure  Laterality Date  . Cardiac catheterization      with PCI RCA 10/19/2010    Patient Active Problem List   Diagnosis Date Noted  . Pre-syncope 11/13/2013  . Carotid artery disease   . Chronic combined systolic and diastolic CHF (congestive heart failure) 04/27/2013  . Dizziness   . Hypotension 05/04/2012  . Coronary artery disease   . Ejection fraction < 50%   . DYSLIPIDEMIA 01/11/2008  . 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 ANAL FISSURE 01/10/2008  . Nonspecific (abnormal) findings on radiological and other examination of body structure 11/28/2007  . ABNORMAL RADIOLOGIAL EXAMINATION 11/28/2007  . PULMONARY NODULE 09/08/2007  . HIP PAIN, RIGHT 07/28/2007  . 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 KERATODERMA, ACQUIRED 04/12/2007  . DYSURIA 04/12/2007  . TOBACCO USE 02/21/2007  . DYSPEPSIA 02/21/2007  . HEPATITIS C 12/19/2006  . ABUSE, ALCOHOL, IN REMISSION 12/19/2006  . ABUSE, COCAINE, IN REMISSION 12/19/2006  . DEPRESSION 12/19/2006  . HYPERTENSION 12/19/2006    ROS   Patient denies fever, chills, headache, sweats, rash, change in vision, change in hearing, chest pain, cough, nausea vomiting, urinary symptoms. All other systems are reviewed and are negative  PHYSICAL EXAM  Patient is oriented to person time and place. Affect is usual for him. He is stable. He has a depressed demeanor. There is no jugulovenous distention. Lungs are clear. Respiratory effort is nonlabored. Cardiac exam reveals S1 and S2. There no clicks or significant murmurs. The abdomen is soft. There is no peripheral edema. There no musculoskeletal deformities. There are no skin rashes. Sitting and standing blood pressure was checked. His blood pressure actually went up when he stood up.   Filed Vitals:   11/13/13 0948 11/13/13 0952  BP: 119/79 139/78  Pulse: 68 80  Height: 5\' 10"  (1.778 m)   Weight: 225 lb (102.059 kg)      ASSESSMENT & PLAN

## 2013-11-14 ENCOUNTER — Ambulatory Visit: Payer: Medicare Other | Admitting: Cardiology

## 2013-11-16 ENCOUNTER — Other Ambulatory Visit: Payer: Self-pay | Admitting: Cardiology

## 2013-11-21 ENCOUNTER — Other Ambulatory Visit: Payer: Self-pay | Admitting: Cardiology

## 2013-11-21 LAB — CBC WITH DIFFERENTIAL/PLATELET
Basophils Relative: 1 % (ref 0–1)
Eosinophils Absolute: 0.3 10*3/uL (ref 0.0–0.7)
Eosinophils Relative: 4 % (ref 0–5)
Hemoglobin: 14.9 g/dL (ref 13.0–17.0)
Lymphs Abs: 1.5 10*3/uL (ref 0.7–4.0)
MCH: 31.1 pg (ref 26.0–34.0)
MCV: 90.2 fL (ref 78.0–100.0)
Monocytes Relative: 14 % — ABNORMAL HIGH (ref 3–12)
Platelets: 165 10*3/uL (ref 150–400)
RBC: 4.79 MIL/uL (ref 4.22–5.81)

## 2013-11-21 LAB — TSH: TSH: 3.381 u[IU]/mL (ref 0.350–4.500)

## 2013-11-21 LAB — BASIC METABOLIC PANEL
CO2: 29 mEq/L (ref 19–32)
Calcium: 9.5 mg/dL (ref 8.4–10.5)
Chloride: 97 mEq/L (ref 96–112)
Creat: 1.48 mg/dL — ABNORMAL HIGH (ref 0.50–1.35)
Glucose, Bld: 112 mg/dL — ABNORMAL HIGH (ref 70–99)
Sodium: 136 mEq/L (ref 135–145)

## 2013-11-23 ENCOUNTER — Telehealth: Payer: Self-pay | Admitting: *Deleted

## 2013-11-23 NOTE — Telephone Encounter (Signed)
Pt called back.  He is taking Lasix and Benazepril/HCT but not lisinopril.  Spoke w/Dr.Klein ([facility]) who advises for him to stop Lasix.  Will route to Dr. [name]

## 2013-11-23 NOTE — Telephone Encounter (Signed)
Reviewed his lab in triage and noted his BUN and Creat elevated.  Tried to call pt x 2 to clarify his medications.  According to his med list is on Lasix, lisinopril and Benazepril/HCT 20-25.  Spoke w/Dr. Graciela HusbandsKlein ([facility]) and he advises to clarify meds before any changes be made. Will route to Dr. [name] for evaluation

## 2013-11-28 ENCOUNTER — Telehealth: Payer: Self-pay | Admitting: Cardiology

## 2013-11-28 NOTE — Telephone Encounter (Signed)
**Note De-Identified  Obfuscation** Pt states that he heard an old message on his VM from [place] and that he realized that after he called this morning.

## 2013-11-28 NOTE — Telephone Encounter (Signed)
Returned call to patient no answer.LMTC. 

## 2013-11-28 NOTE — Telephone Encounter (Signed)
New problem   [place] a call [place]. Please call pt

## 2013-11-28 NOTE — Telephone Encounter (Signed)
Follow Up  ° °Pt returned call//SR  °

## 2013-12-13 ENCOUNTER — Other Ambulatory Visit: Payer: Self-pay | Admitting: Cardiology

## 2013-12-14 ENCOUNTER — Other Ambulatory Visit: Payer: Self-pay | Admitting: Cardiology

## 2013-12-14 MED ORDER — CARVEDILOL 25 MG PO TABS: ORAL_TABLET | ORAL | Status: DC

## 2014-01-07 ENCOUNTER — Encounter: Payer: Self-pay | Admitting: Cardiology

## 2014-01-07 ENCOUNTER — Ambulatory Visit (INDEPENDENT_AMBULATORY_CARE_PROVIDER_SITE_OTHER): Payer: Medicare Other | Admitting: Cardiology

## 2014-01-07 VITALS — BP 110/76 | HR 70 | Ht 70.0 in | Wt 230.0 lb

## 2014-01-07 DIAGNOSIS — I2510 Atherosclerotic heart disease of native coronary artery without angina pectoris: Secondary | ICD-10-CM

## 2014-01-07 DIAGNOSIS — I10 Essential (primary) hypertension: Secondary | ICD-10-CM

## 2014-01-07 DIAGNOSIS — R55 Syncope and collapse: Secondary | ICD-10-CM

## 2014-01-07 NOTE — Assessment & Plan Note (Signed)
Coronary disease is stable. No change in therapy. 

## 2014-01-07 NOTE — Progress Notes (Signed)
HPI  This patient is seen today to followup coronary disease and mild left ventricular dysfunction and dizziness. He had worn an event recorder. I reviewed the extensive data as he called in with symptoms multiple times. He had dizziness and lightheadedness. With each occasion he had sinus rhythm. Overall there was sinus rhythm to sinus tachycardia. There was no significant correlation between his symptoms and his heart rate. No diagnostic abnormalities were noted on the monitor. It was noted that he is creatinine had gone up. His Lasix was stopped. He does feel better with this. He has had only rare episodes of dizziness. Overall he is doing relatively well today.  No Known Allergies  Current Outpatient Prescriptions  Medication Sig Dispense Refill  . ARIPiprazole (ABILIFY) 5 MG tablet Take 5 mg by mouth daily.        Marland Kitchen aspirin 81 MG tablet Take 81 mg by mouth daily.      Marland Kitchen atorvastatin (LIPITOR) 20 MG tablet Take 1 tablet (20 mg total) by mouth daily.  30 tablet  6  . benazepril-hydrochlorthiazide (LOTENSIN HCT) 20-25 MG per tablet Take 1 tablet by mouth daily.       Marland Kitchen buPROPion (WELLBUTRIN XL) 300 MG 24 hr tablet Take 300 mg by mouth daily.        . carvedilol (COREG) 25 MG tablet TAKE 1 TABLET BY MOUTH TWICE DAILY  60 tablet  6  . clopidogrel (PLAVIX) 75 MG tablet TAKE 1 TABLET BY MOUTH EVERY DAY WITH FOOD  30 tablet  1  . diazepam (VALIUM) 5 MG tablet Take 5 mg by mouth 3 (three) times daily.        Marland Kitchen doxepin (SINEQUAN) 150 MG capsule Take 150 mg by mouth at bedtime.      . famotidine (PEPCID) 20 MG tablet Take 20 mg by mouth 2 (two) times daily.        Marland Kitchen FLUoxetine (PROZAC) 40 MG capsule Take 40 mg by mouth daily.       . meloxicam (MOBIC) 15 MG tablet Take 15 mg by mouth daily.        . nitroGLYCERIN (NITROSTAT) 0.4 MG SL tablet Place 1 tablet (0.4 mg total) under the tongue every 5 (five) minutes as needed.  25 tablet  3  . Omega-3 Fatty Acids (FISH OIL) 1000 MG CAPS Take 1,000 mg  by mouth 3 (three) times daily.      Marland Kitchen PROAIR HFA 108 (90 BASE) MCG/ACT inhaler Inhale 1 puff into the lungs every 6 (six) hours as needed.       Marland Kitchen tiZANidine (ZANAFLEX) 4 MG tablet Take 4 mg by mouth at bedtime.       No current facility-administered medications for this visit.    History   Social History  . Marital Status: Married    Spouse Name: N/A    Number of Children: N/A  . Years of Education: N/A   Occupational History  . Not on file.   Social History Main Topics  . Smoking status: Former Smoker -- 1.00 packs/day for 35 years    Types: Cigarettes    Quit date: 10/18/2010  . Smokeless tobacco: Never Used  . Alcohol Use: No     Comment: quit  . Drug Use: No  . Sexual Activity: Not on file   Other Topics Concern  . Not on file   Social History Narrative  . No narrative on file    Family History  Problem Relation Age of Onset  .  Coronary artery disease Father   . Cancer Father     Past Medical History  Diagnosis Date  . 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 Ejection fraction < 50%     EF 30% echo November, 2011 (MI)  / EF 40%, echo, months after MI  /  EF 45%, cardiac catheterization, June, 201 to  . CHF (congestive heart failure)     Initially post MI, then improved.  . Depression   . Hepatitis C   . Hypertension   . History of cocaine abuse     in 80's, ex-IVDU  . History of alcohol abuse   . Pulmonary nodule   . Anal fissure   . Dizziness     November, 2013  . Carotid artery disease     Doppler, June, 2014, 1-39% bilateral    Past Surgical History  Procedure Laterality Date  . Cardiac catheterization      with PCI RCA 10/19/2010    Patient Active Problem List   Diagnosis Date Noted  . Pre-syncope 11/13/2013  . Carotid artery  disease   . Chronic combined systolic and diastolic CHF (congestive heart failure) 04/27/2013  . Dizziness   . Hypotension 05/04/2012  . Coronary artery disease   . Ejection fraction < 50%   . DYSLIPIDEMIA 01/11/2008  . 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 ANAL FISSURE 01/10/2008  . Nonspecific (abnormal) findings on radiological and other examination of body structure 11/28/2007  . ABNORMAL RADIOLOGIAL EXAMINATION 11/28/2007  . PULMONARY NODULE 09/08/2007  . HIP PAIN, RIGHT 07/28/2007  . 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 KERATODERMA, ACQUIRED 04/12/2007  . DYSURIA 04/12/2007  . TOBACCO USE 02/21/2007  . DYSPEPSIA 02/21/2007  . HEPATITIS C 12/19/2006  . ABUSE, ALCOHOL, IN REMISSION 12/19/2006  . ABUSE, COCAINE, IN REMISSION 12/19/2006  . DEPRESSION 12/19/2006  . HYPERTENSION 12/19/2006    ROS   Patient denies fever, chills, headache, sweats, rash, change in vision, change in hearing, chest pain, cough, nausea vomiting, urinary symptoms. All other systems are reviewed and are negative.  PHYSICAL EXAM  Patient is oriented to person time and place. Affect is normal. There is no jugulovenous distention. Lungs are clear. Respiratory effort is nonlabored. Cardiac exam reveals S1 and S2. Abdomen is soft. There is no peripheral edema.  Filed Vitals:   01/07/14 1021  BP: 110/76  Pulse: 70  Height: 5\' 10"  (1.778 m)  Weight: 230 lb (104.327 kg)  SpO2: 98%     ASSESSMENT & PLAN

## 2014-01-07 NOTE — Patient Instructions (Signed)

## 2014-01-07 NOTE — Assessment & Plan Note (Signed)
Patient feels better since his Lasix was stopped. No further workup.

## 2014-01-07 NOTE — Assessment & Plan Note (Signed)
Blood pressure is controlled. No change in therapy. 

## 2014-02-08 ENCOUNTER — Other Ambulatory Visit: Payer: Self-pay | Admitting: Cardiology

## 2014-05-07 ENCOUNTER — Other Ambulatory Visit: Payer: Self-pay | Admitting: Cardiology

## 2014-05-27 ENCOUNTER — Encounter: Payer: Self-pay | Admitting: Cardiology

## 2014-05-27 ENCOUNTER — Ambulatory Visit (INDEPENDENT_AMBULATORY_CARE_PROVIDER_SITE_OTHER): Payer: Medicare Other | Admitting: Cardiology

## 2014-05-27 VITALS — BP 115/83 | HR 76 | Ht 70.0 in | Wt 219.0 lb

## 2014-05-27 DIAGNOSIS — I5042 Chronic combined systolic (congestive) and diastolic (congestive) heart failure: Secondary | ICD-10-CM

## 2014-05-27 DIAGNOSIS — I509 Heart failure, unspecified: Secondary | ICD-10-CM

## 2014-05-27 DIAGNOSIS — I2510 Atherosclerotic heart disease of native coronary artery without angina pectoris: Secondary | ICD-10-CM

## 2014-05-27 NOTE — Assessment & Plan Note (Signed)
Coronary disease is stable. No change in therapy. 

## 2014-05-27 NOTE — Patient Instructions (Signed)
Your physician recommends that you schedule a follow-up appointment in: 3 months. You will receive a reminder letter in the mail in about 1-2 months reminding you to call and schedule your appointment. If you don't receive this letter, please contact our office. Your physician recommends that you continue on your current medications as directed. Please refer to the Current Medication list given to you today. 

## 2014-05-27 NOTE — Progress Notes (Signed)
Patient ID: Angel Donovan, male   DOB: 1966/01/26, 48 y.o.   MRN: [id number]    HPI  Patient is seen today in followup coronary disease and mild left ventricular dysfunction and dizziness. He wore an event recorder. We had extensive data from it. He had multiple symptoms. He had no significant arrhythmias. Today he says that his dizziness is somewhat better. He does have some exertional shortness of breath. He may have some PND. However the history is not clear concerning this. If he develops any peripheral edema he takes extra diuretics.   He continues to be anxious. Unfortunately he lost his father.  No Known Allergies  Current Outpatient Prescriptions  Medication Sig Dispense Refill  . ARIPiprazole (ABILIFY) 5 MG tablet Take 5 mg by mouth daily.        Marland Kitchen. aspirin 81 MG tablet Take 81 mg by mouth daily.      Marland Kitchen. atorvastatin (LIPITOR) 20 MG tablet TAKE 1 TABLET BY MOUTH EVERY DAY  30 tablet  6  . benazepril-hydrochlorthiazide (LOTENSIN HCT) 20-25 MG per tablet Take 1 tablet by mouth daily.       Marland Kitchen. buPROPion (WELLBUTRIN XL) 300 MG 24 hr tablet Take 300 mg by mouth daily.        . carvedilol (COREG) 25 MG tablet TAKE 1 TABLET BY MOUTH TWICE DAILY  60 tablet  6  . clopidogrel (PLAVIX) 75 MG tablet TAKE 1 TABLET BY MOUTH EVERY DAY WITH FOOD  30 tablet  6  . diazepam (VALIUM) 5 MG tablet Take 5 mg by mouth 3 (three) times daily.        Marland Kitchen. doxepin (SINEQUAN) 150 MG capsule Take 150 mg by mouth at bedtime.      . famotidine (PEPCID) 20 MG tablet Take 20 mg by mouth 2 (two) times daily.        Marland Kitchen. FLUoxetine (PROZAC) 40 MG capsule Take 40 mg by mouth daily.       . meloxicam (MOBIC) 15 MG tablet Take 15 mg by mouth daily.        . nitroGLYCERIN (NITROSTAT) 0.4 MG SL tablet Place 1 tablet (0.4 mg total) under the tongue every 5 (five) minutes as needed.  25 tablet  3  . Omega-3 Fatty Acids (FISH OIL) 1000 MG CAPS Take 1,000 mg by mouth 2 (two) times daily.       Marland Kitchen. PROAIR HFA 108 (90 BASE) MCG/ACT  inhaler Inhale 1 puff into the lungs every 6 (six) hours as needed.       Marland Kitchen. tiZANidine (ZANAFLEX) 4 MG tablet Take 4 mg by mouth at bedtime.       No current facility-administered medications for this visit.    History   Social History  . Marital Status: Married    Spouse Name: N/A    Number of Children: N/A  . Years of Education: N/A   Occupational History  . Not on file.   Social History Main Topics  . Smoking status: Former Smoker -- 1.00 packs/day for 35 years    Types: Cigarettes    Quit date: 10/18/2010  . Smokeless tobacco: Never Used  . Alcohol Use: No     Comment: quit  . Drug Use: No  . Sexual Activity: Not on file   Other Topics Concern  . Not on file   Social History Narrative  . No narrative on file    Family History  Problem Relation Age of Onset  . Coronary artery disease Father   .  Cancer Father     Past Medical History  Diagnosis Date  . 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 Ejection fraction < 50%     EF 30% echo November, 2011 (MI)  / EF 40%, echo, months after MI  /  EF 45%, cardiac catheterization, June, 201 to  . CHF (congestive heart failure)     Initially post MI, then improved.  . Depression   . Hepatitis C   . Hypertension   . History of cocaine abuse     in 80's, ex-IVDU  . History of alcohol abuse   . Pulmonary nodule   . Anal fissure   . Dizziness     November, 2013  . Carotid artery disease     Doppler, June, 2014, 1-39% bilateral    Past Surgical History  Procedure Laterality Date  . Cardiac catheterization      with PCI RCA 10/19/2010    Patient Active Problem List   Diagnosis Date Noted  . Pre-syncope 11/13/2013  . Carotid artery disease   . Chronic combined systolic and diastolic CHF (congestive heart  failure) 04/27/2013  . Dizziness   . Hypotension 05/04/2012  . Coronary artery disease   . Ejection fraction < 50%   . DYSLIPIDEMIA 01/11/2008  . 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 ANAL FISSURE 01/10/2008  . Nonspecific (abnormal) findings on radiological and other examination of body structure 11/28/2007  . ABNORMAL RADIOLOGIAL EXAMINATION 11/28/2007  . PULMONARY NODULE 09/08/2007  . HIP PAIN, RIGHT 07/28/2007  . 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 KERATODERMA, ACQUIRED 04/12/2007  . DYSURIA 04/12/2007  . TOBACCO USE 02/21/2007  . DYSPEPSIA 02/21/2007  . HEPATITIS C 12/19/2006  . ABUSE, ALCOHOL, IN REMISSION 12/19/2006  . ABUSE, COCAINE, IN REMISSION 12/19/2006  . DEPRESSION 12/19/2006  . HYPERTENSION 12/19/2006    ROS   Patient denies fever, chills, headache, sweats, rash, change in vision, change in hearing, chest pain, cough, nausea vomiting, urinary symptoms. All other systems are reviewed and are negative.  PHYSICAL EXAM   The patient anxiously tabs his foot. He is oriented to person time and place. Head is atraumatic. Sclera and conjunctiva are normal. There is no jugulovenous distention. Lungs are clear. Respiratory effort is nonlabored. Cardiac exam reveals S1 and S2. The abdomen is soft. There is no peripheral edema. There no musculoskeletal deformities. There are no skin rashes.  Filed Vitals:   05/27/14 0934  BP: 115/83  Pulse: 76  Height: 5\' 10"  (1.778 m)  Weight: 219 lb (99.338 kg)   EKG is done today and reviewed by me. There is normal sinus rhythm. There is no significant change from the past.  ASSESSMENT & PLAN

## 2014-05-27 NOTE — Assessment & Plan Note (Signed)
The patient's weight is actually down 11 pounds since his last visit. Some of this may be related to grief over losing his father. There is no definite evidence of volume overload at this time. I'm hesitant to change his medicines. I am concerned about the possibility of some PND. We will not change his medicines. I will see him back for earlier followup.

## 2014-07-09 ENCOUNTER — Other Ambulatory Visit: Payer: Self-pay | Admitting: Cardiology

## 2014-07-09 ENCOUNTER — Encounter (HOSPITAL_COMMUNITY): Payer: Self-pay | Admitting: Emergency Medicine

## 2014-07-09 ENCOUNTER — Inpatient Hospital Stay (HOSPITAL_COMMUNITY): Admission: AD | Admit: 2014-07-09 | Payer: Medicare Other | Source: Intra-hospital | Admitting: Psychiatry

## 2014-07-09 ENCOUNTER — Encounter (HOSPITAL_COMMUNITY): Payer: Self-pay | Admitting: *Deleted

## 2014-07-09 ENCOUNTER — Emergency Department (HOSPITAL_COMMUNITY)
Admission: EM | Admit: 2014-07-09 | Discharge: 2014-07-09 | Disposition: A | Discharge status: Other Acute Inpatient Hospital | Payer: PRIVATE HEALTH INSURANCE | Attending: Emergency Medicine | Admitting: Emergency Medicine

## 2014-07-09 ENCOUNTER — Inpatient Hospital Stay (HOSPITAL_COMMUNITY)
Admission: AD | Admit: 2014-07-09 | Discharge: 2014-07-14 | DRG: 885 | Disposition: A | Discharge status: Home / Self Care | Payer: 59 | Source: Intra-hospital | Attending: Psychiatry | Admitting: Psychiatry

## 2014-07-09 DIAGNOSIS — I252 Old myocardial infarction: Secondary | ICD-10-CM

## 2014-07-09 DIAGNOSIS — I2510 Atherosclerotic heart disease of native coronary artery without angina pectoris: Secondary | ICD-10-CM | POA: Diagnosis present

## 2014-07-09 DIAGNOSIS — Z046 Encounter for general psychiatric examination, requested by authority: Secondary | ICD-10-CM | POA: Insufficient documentation

## 2014-07-09 DIAGNOSIS — I10 Essential (primary) hypertension: Secondary | ICD-10-CM | POA: Insufficient documentation

## 2014-07-09 DIAGNOSIS — I509 Heart failure, unspecified: Secondary | ICD-10-CM | POA: Diagnosis present

## 2014-07-09 DIAGNOSIS — Z87891 Personal history of nicotine dependence: Secondary | ICD-10-CM | POA: Insufficient documentation

## 2014-07-09 DIAGNOSIS — F1021 Alcohol dependence, in remission: Secondary | ICD-10-CM | POA: Diagnosis present

## 2014-07-09 DIAGNOSIS — R45851 Suicidal ideations: Secondary | ICD-10-CM | POA: Diagnosis not present

## 2014-07-09 DIAGNOSIS — Z8719 Personal history of other diseases of the digestive system: Secondary | ICD-10-CM | POA: Insufficient documentation

## 2014-07-09 DIAGNOSIS — Z79899 Other long term (current) drug therapy: Secondary | ICD-10-CM | POA: Diagnosis not present

## 2014-07-09 DIAGNOSIS — G4700 Insomnia, unspecified: Secondary | ICD-10-CM | POA: Diagnosis present

## 2014-07-09 DIAGNOSIS — Z818 Family history of other mental and behavioral disorders: Secondary | ICD-10-CM | POA: Diagnosis not present

## 2014-07-09 DIAGNOSIS — F322 Major depressive disorder, single episode, severe without psychotic features: Secondary | ICD-10-CM

## 2014-07-09 DIAGNOSIS — K219 Gastro-esophageal reflux disease without esophagitis: Secondary | ICD-10-CM | POA: Diagnosis present

## 2014-07-09 DIAGNOSIS — F3289 Other specified depressive episodes: Secondary | ICD-10-CM | POA: Insufficient documentation

## 2014-07-09 DIAGNOSIS — F332 Major depressive disorder, recurrent severe without psychotic features: Principal | ICD-10-CM | POA: Diagnosis present

## 2014-07-09 DIAGNOSIS — Z23 Encounter for immunization: Secondary | ICD-10-CM

## 2014-07-09 DIAGNOSIS — F411 Generalized anxiety disorder: Secondary | ICD-10-CM | POA: Diagnosis present

## 2014-07-09 DIAGNOSIS — F1094 Alcohol use, unspecified with alcohol-induced mood disorder: Secondary | ICD-10-CM | POA: Diagnosis present

## 2014-07-09 DIAGNOSIS — F4310 Post-traumatic stress disorder, unspecified: Secondary | ICD-10-CM | POA: Diagnosis present

## 2014-07-09 DIAGNOSIS — F329 Major depressive disorder, single episode, unspecified: Secondary | ICD-10-CM | POA: Diagnosis not present

## 2014-07-09 DIAGNOSIS — Z8619 Personal history of other infectious and parasitic diseases: Secondary | ICD-10-CM | POA: Insufficient documentation

## 2014-07-09 DIAGNOSIS — Z8249 Family history of ischemic heart disease and other diseases of the circulatory system: Secondary | ICD-10-CM

## 2014-07-09 DIAGNOSIS — Z7982 Long term (current) use of aspirin: Secondary | ICD-10-CM | POA: Diagnosis not present

## 2014-07-09 LAB — BASIC METABOLIC PANEL
Anion gap: 12 (ref 5–15)
BUN: 16 mg/dL (ref 6–23)
CALCIUM: 9.6 mg/dL (ref 8.4–10.5)
CO2: 26 meq/L (ref 19–32)
Chloride: 102 mEq/L (ref 96–112)
Creatinine, Ser: 1.22 mg/dL (ref 0.50–1.35)
GFR calc Af Amer: 78 mL/min — ABNORMAL LOW (ref >90)
GFR, EST NON AFRICAN AMERICAN: 68 mL/min — AB (ref >90)
GLUCOSE: 102 mg/dL — AB (ref 70–99)
Potassium: 4.3 mEq/L (ref 3.7–5.3)
SODIUM: 140 meq/L (ref 137–147)

## 2014-07-09 LAB — CBC WITH DIFFERENTIAL/PLATELET
Basophils Absolute: 0 10*3/uL (ref 0.0–0.1)
Basophils Relative: 0 % (ref 0–1)
EOS ABS: 0.3 10*3/uL (ref 0.0–0.7)
EOS PCT: 5 % (ref 0–5)
HCT: 46 % (ref 39.0–52.0)
HEMOGLOBIN: 15.5 g/dL (ref 13.0–17.0)
LYMPHS ABS: 1.6 10*3/uL (ref 0.7–4.0)
LYMPHS PCT: 27 % (ref 12–46)
MCH: 31.2 pg (ref 26.0–34.0)
MCHC: 33.7 g/dL (ref 30.0–36.0)
MCV: 92.6 fL (ref 78.0–100.0)
MONO ABS: 0.6 10*3/uL (ref 0.1–1.0)
MONOS PCT: 10 % (ref 3–12)
Neutro Abs: 3.6 10*3/uL (ref 1.7–7.7)
Neutrophils Relative %: 58 % (ref 43–77)
PLATELETS: 153 10*3/uL (ref 150–400)
RBC: 4.97 MIL/uL (ref 4.22–5.81)
RDW: 13.3 % (ref 11.5–15.5)
WBC: 6.2 10*3/uL (ref 4.0–10.5)

## 2014-07-09 LAB — SALICYLATE LEVEL

## 2014-07-09 LAB — RAPID URINE DRUG SCREEN, HOSP PERFORMED
AMPHETAMINES: NOT DETECTED
Barbiturates: NOT DETECTED
Benzodiazepines: POSITIVE — AB
Cocaine: NOT DETECTED
OPIATES: NOT DETECTED
TETRAHYDROCANNABINOL: NOT DETECTED

## 2014-07-09 LAB — ETHANOL: Alcohol, Ethyl (B): <11 mg/dL (ref 0–11)

## 2014-07-09 MED ORDER — MELOXICAM 15 MG PO TABS
15.0000 mg | ORAL_TABLET | Freq: Every day | ORAL | Status: DC
  Administered 2014-07-10 – 2014-07-14 (×5): 15 mg via ORAL
  Filled 2014-07-09 (×7): qty 1

## 2014-07-09 MED ORDER — ASPIRIN 81 MG PO CHEW
81.0000 mg | CHEWABLE_TABLET | Freq: Every day | ORAL | Status: DC
  Administered 2014-07-09: 81 mg via ORAL
  Filled 2014-07-09: qty 1

## 2014-07-09 MED ORDER — HYDROCHLOROTHIAZIDE 25 MG PO TABS
25.0000 mg | ORAL_TABLET | Freq: Every day | ORAL | Status: DC
  Filled 2014-07-09 (×3): qty 1

## 2014-07-09 MED ORDER — BUPROPION HCL ER (XL) 300 MG PO TB24
300.0000 mg | ORAL_TABLET | Freq: Every day | ORAL | Status: DC
  Administered 2014-07-10 – 2014-07-11 (×2): 300 mg via ORAL
  Filled 2014-07-09 (×3): qty 1

## 2014-07-09 MED ORDER — VITAMIN B-1 100 MG PO TABS
100.0000 mg | ORAL_TABLET | Freq: Every day | ORAL | Status: DC
  Administered 2014-07-10 – 2014-07-14 (×5): 100 mg via ORAL
  Filled 2014-07-09 (×7): qty 1

## 2014-07-09 MED ORDER — BENAZEPRIL-HYDROCHLOROTHIAZIDE 20-25 MG PO TABS: 1.0000 | ORAL_TABLET | Freq: Every day | ORAL | Status: DC

## 2014-07-09 MED ORDER — DIAZEPAM 5 MG PO TABS: 5.0000 mg | ORAL_TABLET | Freq: Three times a day (TID) | ORAL | Status: DC

## 2014-07-09 MED ORDER — CARVEDILOL 25 MG PO TABS
25.0000 mg | ORAL_TABLET | Freq: Two times a day (BID) | ORAL | Status: DC
  Administered 2014-07-10 – 2014-07-14 (×8): 25 mg via ORAL
  Filled 2014-07-09 (×13): qty 1

## 2014-07-09 MED ORDER — FAMOTIDINE 20 MG PO TABS
20.0000 mg | ORAL_TABLET | Freq: Two times a day (BID) | ORAL | Status: DC
  Administered 2014-07-10 – 2014-07-14 (×8): 20 mg via ORAL
  Filled 2014-07-09 (×15): qty 1

## 2014-07-09 MED ORDER — ARIPIPRAZOLE 5 MG PO TABS: 5.0000 mg | ORAL_TABLET | Freq: Every day | ORAL | Status: DC

## 2014-07-09 MED ORDER — ALUM & MAG HYDROXIDE-SIMETH 200-200-20 MG/5ML PO SUSP
30.0000 mL | ORAL | Status: DC | PRN
Start: 2014-07-09 — End: 2014-07-14

## 2014-07-09 MED ORDER — ASPIRIN EC 81 MG PO TBEC
81.0000 mg | DELAYED_RELEASE_TABLET | Freq: Every day | ORAL | Status: DC
  Administered 2014-07-10 – 2014-07-13 (×4): 81 mg via ORAL
  Filled 2014-07-09 (×6): qty 1

## 2014-07-09 MED ORDER — MELOXICAM 7.5 MG PO TABS
ORAL_TABLET | ORAL | Status: AC
  Filled 2014-07-09: qty 2

## 2014-07-09 MED ORDER — CHLORDIAZEPOXIDE HCL 25 MG PO CAPS
25.0000 mg | ORAL_CAPSULE | Freq: Four times a day (QID) | ORAL | Status: AC
  Administered 2014-07-09 – 2014-07-11 (×6): 25 mg via ORAL
  Filled 2014-07-09 (×6): qty 1

## 2014-07-09 MED ORDER — FLUOXETINE HCL 20 MG PO CAPS
40.0000 mg | ORAL_CAPSULE | Freq: Every day | ORAL | Status: DC
  Administered 2014-07-10 – 2014-07-14 (×5): 40 mg via ORAL
  Filled 2014-07-09 (×3): qty 2
  Filled 2014-07-09: qty 4
  Filled 2014-07-09 (×3): qty 2

## 2014-07-09 MED ORDER — ASPIRIN 81 MG PO TABS: 81.0000 mg | ORAL_TABLET | Freq: Every day | ORAL | Status: DC

## 2014-07-09 MED ORDER — CHLORDIAZEPOXIDE HCL 25 MG PO CAPS
25.0000 mg | ORAL_CAPSULE | Freq: Three times a day (TID) | ORAL | Status: AC
  Administered 2014-07-11 – 2014-07-12 (×3): 25 mg via ORAL
  Filled 2014-07-09 (×3): qty 1

## 2014-07-09 MED ORDER — MELOXICAM 15 MG PO TABS
15.0000 mg | ORAL_TABLET | Freq: Every day | ORAL | Status: DC
  Administered 2014-07-09: 15 mg via ORAL
  Filled 2014-07-09 (×3): qty 1

## 2014-07-09 MED ORDER — BENAZEPRIL HCL 10 MG PO TABS
20.0000 mg | ORAL_TABLET | Freq: Every day | ORAL | Status: DC
  Filled 2014-07-09 (×2): qty 1
  Filled 2014-07-09: qty 2

## 2014-07-09 MED ORDER — CLOPIDOGREL BISULFATE 75 MG PO TABS
75.0000 mg | ORAL_TABLET | Freq: Every day | ORAL | Status: DC
  Administered 2014-07-10 – 2014-07-14 (×5): 75 mg via ORAL
  Filled 2014-07-09 (×7): qty 1

## 2014-07-09 MED ORDER — CHLORDIAZEPOXIDE HCL 25 MG PO CAPS
25.0000 mg | ORAL_CAPSULE | ORAL | Status: AC
  Administered 2014-07-12 – 2014-07-13 (×2): 25 mg via ORAL
  Filled 2014-07-09 (×2): qty 1

## 2014-07-09 MED ORDER — ACETAMINOPHEN 325 MG PO TABS: 650.0000 mg | ORAL_TABLET | Freq: Four times a day (QID) | ORAL | Status: DC | PRN

## 2014-07-09 MED ORDER — THIAMINE HCL 100 MG/ML IJ SOLN
100.0000 mg | Freq: Once | INTRAMUSCULAR | Status: DC
  Filled 2014-07-09: qty 2

## 2014-07-09 MED ORDER — DIAZEPAM 5 MG PO TABS: 5.0000 mg | ORAL_TABLET | Freq: Three times a day (TID) | ORAL | Status: DC | PRN

## 2014-07-09 MED ORDER — ARIPIPRAZOLE 5 MG PO TABS
5.0000 mg | ORAL_TABLET | Freq: Every day | ORAL | Status: DC
  Administered 2014-07-09: 5 mg via ORAL
  Filled 2014-07-09 (×3): qty 1

## 2014-07-09 MED ORDER — ATORVASTATIN CALCIUM 20 MG PO TABS
20.0000 mg | ORAL_TABLET | Freq: Every day | ORAL | Status: DC
  Administered 2014-07-10 – 2014-07-13 (×4): 20 mg via ORAL
  Filled 2014-07-09 (×6): qty 1

## 2014-07-09 MED ORDER — TIZANIDINE HCL 4 MG PO TABS
4.0000 mg | ORAL_TABLET | Freq: Every day | ORAL | Status: DC
  Administered 2014-07-09 – 2014-07-13 (×5): 4 mg via ORAL
  Filled 2014-07-09 (×6): qty 1
  Filled 2014-07-09: qty 2
  Filled 2014-07-09: qty 1

## 2014-07-09 MED ORDER — LOPERAMIDE HCL 2 MG PO CAPS: 2.0000 mg | ORAL_CAPSULE | ORAL | Status: AC | PRN

## 2014-07-09 MED ORDER — CARVEDILOL 12.5 MG PO TABS
25.0000 mg | ORAL_TABLET | Freq: Two times a day (BID) | ORAL | Status: DC
  Administered 2014-07-09: 25 mg via ORAL
  Filled 2014-07-09: qty 2
  Filled 2014-07-09 (×2): qty 1
  Filled 2014-07-09: qty 2

## 2014-07-09 MED ORDER — LORAZEPAM 1 MG PO TABS
1.0000 mg | ORAL_TABLET | Freq: Once | ORAL | Status: AC
  Administered 2014-07-09: 1 mg via ORAL
  Filled 2014-07-09: qty 1

## 2014-07-09 MED ORDER — CARVEDILOL 25 MG PO TABS: 25.0000 mg | ORAL_TABLET | Freq: Two times a day (BID) | ORAL | Status: DC

## 2014-07-09 MED ORDER — HYDROXYZINE HCL 25 MG PO TABS: 25.0000 mg | ORAL_TABLET | Freq: Four times a day (QID) | ORAL | Status: AC | PRN

## 2014-07-09 MED ORDER — CHLORDIAZEPOXIDE HCL 25 MG PO CAPS
25.0000 mg | ORAL_CAPSULE | Freq: Four times a day (QID) | ORAL | Status: AC | PRN
Start: 2014-07-09 — End: 2014-07-12

## 2014-07-09 MED ORDER — ONDANSETRON 4 MG PO TBDP: 4.0000 mg | ORAL_TABLET | Freq: Four times a day (QID) | ORAL | Status: AC | PRN

## 2014-07-09 MED ORDER — PNEUMOCOCCAL VAC POLYVALENT 25 MCG/0.5ML IJ INJ: 0.5000 mL | INJECTION | INTRAMUSCULAR | Status: DC

## 2014-07-09 MED ORDER — BUPROPION HCL ER (XL) 300 MG PO TB24
300.0000 mg | ORAL_TABLET | Freq: Every day | ORAL | Status: DC
  Filled 2014-07-09 (×3): qty 1

## 2014-07-09 MED ORDER — ADULT MULTIVITAMIN W/MINERALS CH
1.0000 | ORAL_TABLET | Freq: Every day | ORAL | Status: DC
  Administered 2014-07-10 – 2014-07-14 (×5): 1 via ORAL
  Filled 2014-07-09 (×7): qty 1

## 2014-07-09 MED ORDER — CHLORDIAZEPOXIDE HCL 25 MG PO CAPS: 25.0000 mg | ORAL_CAPSULE | Freq: Every day | ORAL | Status: DC

## 2014-07-09 MED ORDER — MAGNESIUM HYDROXIDE 400 MG/5ML PO SUSP: 30.0000 mL | Freq: Every day | ORAL | Status: DC | PRN

## 2014-07-09 MED ORDER — NITROGLYCERIN 0.4 MG SL SUBL: 0.4000 mg | SUBLINGUAL_TABLET | SUBLINGUAL | Status: DC | PRN

## 2014-07-09 MED ORDER — ATORVASTATIN CALCIUM 20 MG PO TABS
20.0000 mg | ORAL_TABLET | Freq: Every day | ORAL | Status: DC
  Administered 2014-07-09: 20 mg via ORAL
  Filled 2014-07-09 (×2): qty 1

## 2014-07-09 MED ORDER — BENAZEPRIL HCL 20 MG PO TABS
20.0000 mg | ORAL_TABLET | Freq: Every day | ORAL | Status: DC
  Administered 2014-07-10 – 2014-07-14 (×5): 20 mg via ORAL
  Filled 2014-07-09 (×7): qty 1

## 2014-07-09 MED ORDER — ACETAMINOPHEN 500 MG PO TABS
1000.0000 mg | ORAL_TABLET | Freq: Once | ORAL | Status: AC
  Administered 2014-07-09: 1000 mg via ORAL
  Filled 2014-07-09: qty 2

## 2014-07-09 MED ORDER — DOXEPIN HCL 25 MG PO CAPS
150.0000 mg | ORAL_CAPSULE | Freq: Every day | ORAL | Status: DC
  Administered 2014-07-09 – 2014-07-10 (×2): 150 mg via ORAL
  Filled 2014-07-09 (×2): qty 6
  Filled 2014-07-09: qty 5
  Filled 2014-07-09 (×2): qty 6

## 2014-07-09 MED ORDER — ALBUTEROL SULFATE HFA 108 (90 BASE) MCG/ACT IN AERS
1.0000 | INHALATION_SPRAY | Freq: Four times a day (QID) | RESPIRATORY_TRACT | Status: DC | PRN
  Administered 2014-07-11 – 2014-07-14 (×4): 1 via RESPIRATORY_TRACT
  Filled 2014-07-09: qty 6.7

## 2014-07-09 MED ORDER — HYDROCHLOROTHIAZIDE 25 MG PO TABS
25.0000 mg | ORAL_TABLET | Freq: Every day | ORAL | Status: DC
  Administered 2014-07-10 – 2014-07-14 (×5): 25 mg via ORAL
  Filled 2014-07-09 (×7): qty 1

## 2014-07-09 MED ORDER — CLOPIDOGREL BISULFATE 75 MG PO TABS
75.0000 mg | ORAL_TABLET | Freq: Every day | ORAL | Status: DC
Start: 2014-07-09 — End: 2014-07-09
  Filled 2014-07-09: qty 1

## 2014-07-09 MED ORDER — TRAZODONE HCL 50 MG PO TABS: 50.0000 mg | ORAL_TABLET | Freq: Every evening | ORAL | Status: DC | PRN

## 2014-07-09 MED ORDER — ARIPIPRAZOLE 5 MG PO TABS
5.0000 mg | ORAL_TABLET | Freq: Every day | ORAL | Status: DC
  Administered 2014-07-10 – 2014-07-13 (×4): 5 mg via ORAL
  Filled 2014-07-09 (×3): qty 1
  Filled 2014-07-09: qty 2
  Filled 2014-07-09 (×2): qty 1

## 2014-07-09 NOTE — ED Notes (Signed)
Telepsych in progress. 

## 2014-07-09 NOTE — ED Provider Notes (Signed)
CSN: [id number]     Arrival date & time 07/09/14  4098 History  This chart was scribed for Shon Baton, MD by Leone Payor, ED Scribe. This patient was seen in room APA16A/APA16A and the patient's care was started 10:15 AM.    Chief Complaint  Patient presents with  . V70.1    The history is provided by the patient. No language interpreter was used.    HPI Comments: Angel Donovan is a 48 y.o. male with past medical history of depression who presents to the Emergency Department complaining of SI and increased depression today. Patient states his depression has worsened since his father passed away in 2023/01/20 of this year. He states he has planned to commit suicide by using a gun, however, he has now removed all guns from his home due to these thoughts.  Patient states that recently he has been trying to figure out a way to "make it look like an accident." He reports prior hospitalization for SI. He has a psychiatrist but states he needs more help. He denies drug or alcohol use. He denies current pain.   Past Medical History  Diagnosis Date  . 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 Ejection fraction < 50%     EF 30% echo November, 2011 (MI)  / EF 40%, echo, months after MI  /  EF 45%, cardiac catheterization, June, 201 to  . CHF (congestive heart failure)     Initially post MI, then improved.  . Depression   . Hepatitis C   . Hypertension   . History of cocaine abuse     in 80's, ex-IVDU  . History of alcohol abuse   . Pulmonary nodule   . Anal fissure   . Dizziness     November, 2013  . Carotid artery disease     Doppler, June, 2014, 1-39% bilateral   Past Surgical History  Procedure Laterality Date  . Cardiac catheterization      with PCI RCA 10/19/2010   Family  History  Problem Relation Age of Onset  . Coronary artery disease Father   . Cancer Father    History  Substance Use Topics  . Smoking status: Former Smoker -- 1.00 packs/day for 35 years    Types: Cigarettes    Quit date: 10/18/2010  . Smokeless tobacco: Never Used  . Alcohol Use: No     Comment: quit    Review of Systems  Constitutional: Negative.  Negative for fever.  Respiratory: Negative.  Negative for chest tightness and shortness of breath.   Cardiovascular: Negative.  Negative for chest pain.  Gastrointestinal: Negative.  Negative for abdominal pain.  All other systems reviewed and are negative.     Allergies  Review of patient's allergies indicates no known allergies.  Home Medications   Prior to Admission medications   Medication Sig Start Date End Date Taking? Authorizing Provider  ARIPiprazole (ABILIFY) 5 MG tablet Take 5 mg by mouth daily.      Historical Provider, MD  aspirin 81 MG tablet Take 81 mg by mouth daily.    Historical Provider, MD  atorvastatin (LIPITOR) 20 MG tablet TAKE 1 TABLET BY MOUTH EVERY DAY    Luis Abed, MD  benazepril-hydrochlorthiazide (LOTENSIN HCT) 20-25 MG per tablet Take 1 tablet by  mouth daily.  11/09/13   Historical Provider, MD  buPROPion (WELLBUTRIN XL) 300 MG 24 hr tablet Take 300 mg by mouth daily.      Historical Provider, MD  carvedilol (COREG) 25 MG tablet TAKE 1 TABLET BY MOUTH TWICE DAILY 12/14/13   Luis AbedJeffrey D Katz, MD  clopidogrel (PLAVIX) 75 MG tablet TAKE 1 TABLET BY MOUTH EVERY DAY WITH FOOD 02/08/14   Luis AbedJeffrey D Katz, MD  diazepam (VALIUM) 5 MG tablet Take 5 mg by mouth 3 (three) times daily.      Historical Provider, MD  doxepin (SINEQUAN) 150 MG capsule Take 150 mg by mouth at bedtime.    Historical Provider, MD  famotidine (PEPCID) 20 MG tablet Take 20 mg by mouth 2 (two) times daily.      Historical Provider, MD  FLUoxetine (PROZAC) 40 MG capsule Take 40 mg by mouth daily.  10/16/13   Historical Provider, MD   meloxicam (MOBIC) 15 MG tablet Take 15 mg by mouth daily.      Historical Provider, MD  nitroGLYCERIN (NITROSTAT) 0.4 MG SL tablet Place 1 tablet (0.4 mg total) under the tongue every 5 (five) minutes as needed. 12/22/11   Luis AbedJeffrey D Katz, MD  Omega-3 Fatty Acids (FISH OIL) 1000 MG CAPS Take 1,000 mg by mouth 2 (two) times daily.     Historical Provider, MD  PROAIR HFA 108 (90 BASE) MCG/ACT inhaler Inhale 1 puff into the lungs every 6 (six) hours as needed.  06/17/12   Historical Provider, MD  tiZANidine (ZANAFLEX) 4 MG tablet Take 4 mg by mouth at bedtime.    Historical Provider, MD   BP 135/88  Pulse 88  Temp(Src) 98.2 F (36.8 C) (Oral)  Resp 20  Ht 5\' 10"  (1.778 m)  Wt 215 lb (97.523 kg)  BMI 30.85 kg/m2  SpO2 96% Physical Exam  Nursing note and vitals reviewed. Constitutional: He is oriented to person, place, and time.  Tearful, anxious appearing  HENT:  Head: Normocephalic and atraumatic.  Cardiovascular: Normal rate, regular rhythm and normal heart sounds.   No murmur heard. Pulmonary/Chest: Effort normal and breath sounds normal. No respiratory distress. He has no wheezes.  Musculoskeletal: He exhibits no edema.  Neurological: He is alert and oriented to person, place, and time.  Skin: Skin is warm and dry.  Psychiatric:  Tearful    ED Course  Procedures (including critical care time)  DIAGNOSTIC STUDIES: Oxygen Saturation is 96% on RA, adequate by my interpretation.    COORDINATION OF CARE: 10:17 AM Discussed treatment plan with pt at bedside and pt agreed to plan.   Labs Review Labs Reviewed  URINE RAPID DRUG SCREEN (HOSP PERFORMED) - Abnormal; Notable for the following:    Benzodiazepines POSITIVE (*)    All other components within normal limits  CBC WITH DIFFERENTIAL  BASIC METABOLIC PANEL  ETHANOL  SALICYLATE LEVEL    Imaging Review No results found.   EKG Interpretation None      MDM   Final diagnoses:  Suicidal ideation    Patient  presents with suicidal ideation. Patient has a plan. He is her tearful on exam and appears depressed. Lab work placed and scissors at the bedside. Will place TTS consult.  I personally performed the services described in this documentation, which was scribed in my presence. The recorded information has been reviewed and is accurate.   Shon Batonourtney F Fajr Fife, MD 07/09/14 [phone]

## 2014-07-09 NOTE — ED Notes (Signed)
Pt states that he has been dealing with depression since  His father passed away in February, depression has become worse[facility] to make it look like an accident, denies any HI, denies any hallucination visual or auditory.
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
## 2014-07-09 NOTE — Tx Team (Addendum)
Initial Interdisciplinary Treatment Plan   PATIENT STRESSORS: Health problems Legal issue Loss of father Traumatic event   PROBLEM LIST: Problem List/Patient Goals Date to be addressed Date deferred Reason deferred Estimated date of resolution  "Help me deal with the trauma of my passing" 07/09/14     "I'd like to be able to be less emotional" 07/09/14     Depression 07/09/14     Grief issues 07/09/14     Increased risk for suicide 07/09/14     Concurrent medical conditions: CAD, HTN, PVD                         DISCHARGE CRITERIA:  Ability to meet basic life and health needs Adequate post-discharge living arrangements Improved stabilization in mood, thinking, and/or behavior Medical problems require only outpatient monitoring Motivation to continue treatment in a less acute level of care Need for constant or close observation no longer present Reduction of life-threatening or endangering symptoms to within safe limits Safe-care adequate arrangements made Verbal commitment to aftercare and medication compliance  PRELIMINARY DISCHARGE PLAN: Outpatient therapy Participate in family therapy Return to previous living arrangement  PATIENT/FAMIILY INVOLVEMENT: This treatment plan has been presented to and reviewed with the patient, Angel Donovan, and/or family member.  The patient and family have been given the opportunity to ask questions and make suggestions.  Fransico MichaelBrooks, Cristiana Yochim [facility] 07/09/2014, 9:14 PM

## 2014-07-09 NOTE — ED Notes (Signed)
Pt complaining of back pain, takes mobic, MD aware, orders given

## 2014-07-10 DIAGNOSIS — F329 Major depressive disorder, single episode, unspecified: Secondary | ICD-10-CM

## 2014-07-10 NOTE — [facility] Group Notes (Signed)
[facility] After[facility] Discharge Planning [facility] Note   07/10/2014 9:40 AM    Participation Quality:  Appropraite  Mood/Affect:  Depressed, Flat, Tearful  Depression Rating:  8  Anxiety Rating:  8  Thoughts of Suicide:  No  Will you contract for safety?   NA  Current AVH:  No  Plan for Discharge/Comments:  Patient attended discharge planning group and actively participated in group.  He reports he has a home and [facility]  CSW provided all participants with daily workbook.   Transportation Means: Patient has transportation.   Supports:  Patient has a support system.   Angel Donovan, Angel Donovan

## 2014-07-10 NOTE — Progress Notes (Signed)
Patient ID: Angel JewelDennis L Donovan, male   DOB: 09/10/1966, 48 y.o.   MRN: [id number]   Pt was pleasant and cooperative, but tearful during the adm process. Stated he's been having thoughts of harming himself since the death of his dad in Feb of 2015. Pt stated, "he died on my watch". Stated that he "took his dad from the hospital but died after 5 days. Stated, "I don't have much education and I'm the one taking care of the estate". Pt states he's unfamiliar with the laws and practices dealing with the estate. Prior to adm pt was experiencing SI, however denied at the time of adm.

## 2014-07-10 NOTE — [facility] Group Notes (Signed)
[facility] LCSW Group Therapy 07/10/2014  1:15 PM Type of Therapy: Group Therapy Participation Level: Active  Participation Quality: Attentive, Sharing and Supportive  Affect: Depressed and Flat  Cognitive: Alert and Oriented  Insight: Developing/Improving and Engaged  Engagement in Therapy: Developing/Improving and Engaged  Modes of Intervention: Clarification, Confrontation, Discussion, Education, Exploration, Limit-setting, Orientation, Problem-solving, Rapport Building, Dance movement psychotherapisteality Testing, Socialization and Support  Summary of Progress/Problems: The topic for group today was emotional regulation. This group focused on both positive and negative emotion identification and allowed group members to process ways to identify feelings, regulate negative emotions, and find healthy ways to manage internal/external emotions. Group members were asked to reflect on a time when their reaction to an emotion led to a negative outcome and explored how alternative responses using emotion regulation would have benefited them. Group members were also asked to discuss a time when emotion regulation was utilized when a negative emotion was experienced. Patient reports that he has a tendency to isolate himself and that he lacks a strong social support system. Patient reports that his outpatient group therapy helps him to feel supported and helps him to understand that his is not at fault for events in his past. CSW and other group members provided patient with emotional support.   Samuella BruinKristin Janani Chamber, MSW, [employer]CSWA Clinical Social Worker [facility] [phone]

## 2014-07-10 NOTE — Progress Notes (Signed)
Pt has been up for all groups today.  He rated both his depression and hopelessness a 8 and anxiety 10 on his self-inventory.  He denied any S/H ideation or A/V/H. His goal today "learning to deal with strangers" He plans to spend most of his time in the dayroom either in groups or just watching tv with his peers. [facility]
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
## 2014-07-10 NOTE — [facility] Group Notes (Signed)
Adult Psychoeducational Group Note  Date:  07/10/2014 Time:  10:26 PM  Group Topic/Focus:  Wrap-Up Group:   The focus of this group is to help patients review their daily goal of treatment and discuss progress on daily workbooks.  Participation Level:  Active  Participation Quality:  Appropriate  Affect:  Flat  Cognitive:  Alert, Appropriate and Oriented  Insight: Improving  Engagement in Group:  Improving  Modes of Intervention:  Discussion and Support  Additional Comments:  Pt stated that he did not have a very good day because he talked to his social worker today which brought up some things and increased his anxiety. One thing the pt likes to do for fun is bass fishing.   Dwain SarnaBowman, Jaasia Viglione P 07/10/2014, 10:26 PM

## 2014-07-10 NOTE — [facility] Suicide Risk Assessment (Signed)
[facility] INPATIENT:  Family/Significant Other Suicide Prevention Education  Suicide Prevention Education:  Patient Refusal for Family/Significant Other Suicide Prevention Education: The patient Angel Donovan has refused to provide written consent for family/significant other to be provided Family/Significant Other Suicide Prevention Education during admission and/or prior to discharge.  Physician notified.  Tityana Pagan Hairston 07/10/2014, 11:15 AM

## 2014-07-10 NOTE — Progress Notes (Signed)
D:Patient in the dayroom on approach. Patient appears sad and depressed.  Patient states his day was ok.  Patient states he was able to talk to the doctor.  Patient denies SI/HI and denies AVH A: Staff to monitor Q 15 mins for safety.  Encouragement and support offered.  Scheduled medications administered per orders. R: Patient remains safe on the unit.  Patient attended group tonight.  Patient visible on the unit.  Patient taking administered medications.
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
## 2014-07-10 NOTE — Progress Notes (Signed)
NUTRITION ASSESSMENT  Pt identified as at risk on the Malnutrition Screen Tool  INTERVENTION: Educated patient on the importance of nutrition and encouraged intake of food and beverages.   NUTRITION DIAGNOSIS: Unintentional weight loss related to sub-optimal intake as evidenced by pt report.   Goal: Pt to meet >/= 90% of their estimated nutrition needs.  Monitor:  PO intake  Assessment:  Patient is a 48 year old man, who reports worsening depression since his father passed away in 01-04-14. States he has been feeling very depressed, and has had feelings of guilt associated with his father's death "like he died on my watch, and it makes me feel terrible, even though I know I did what I could".   - Met with pt who reports his appetite has been down since February - Said he lost 17 pounds unintentionally since then - Reports his intake was just 1 meal/day  - Said his appetite has improved since admission - Denies needing any nutritional supplements   48 y.o. male  Height: Ht Readings from Last 1 Encounters:  07/09/14 '5\' 7"'  (1.702 m)    Weight: Wt Readings from Last 1 Encounters:  07/09/14 214 lb (97.07 kg)    Weight Hx: Wt Readings from Last 10 Encounters:  07/09/14 214 lb (97.07 kg)  07/09/14 215 lb (97.523 kg)  05/27/14 219 lb (99.338 kg)  01/07/14 230 lb (104.327 kg)  11/13/13 225 lb (102.059 kg)  04/27/13 224 lb 6.4 oz (101.787 kg)  10/27/12 230 lb (104.327 kg)  09/29/12 234 lb (106.142 kg)  06/28/12 226 lb 12.8 oz (102.876 kg)  05/04/12 221 lb (100.245 kg)    BMI:  Body mass index is 33.51 kg/(m^2). Pt meets criteria for class I obesity based on current BMI.  Estimated Nutritional Needs: Kcal: 25-30 kcal/kg of ideal body weight Protein: > 1 gram protein/kg Fluid: 1 ml/kcal  Diet Order: General Pt is also offered choice of unit snacks mid-morning and mid-afternoon.  Pt is eating as desired.   Lab results and medications reviewed. Getting daily  multivitamin and thiamine.  Carlis Stable MS, [place], LDN [phone] Pager [phone] Weekend/After Hours Pager
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
## 2014-07-11 LAB — LIPID PANEL
CHOL/HDL RATIO: 4.4 ratio
Cholesterol: 101 mg/dL (ref 0–200)
HDL: 23 mg/dL — AB (ref >39)
LDL Cholesterol: 34 mg/dL (ref 0–99)
Triglycerides: 218 mg/dL — ABNORMAL HIGH (ref <150)
VLDL: 44 mg/dL — ABNORMAL HIGH (ref 0–40)

## 2014-07-11 LAB — HEMOGLOBIN A1C
Hgb A1c MFr Bld: 6 % — ABNORMAL HIGH (ref <5.7)
MEAN PLASMA GLUCOSE: 126 mg/dL — AB (ref <117)

## 2014-07-11 MED ORDER — DOXEPIN HCL 100 MG PO CAPS
100.0000 mg | ORAL_CAPSULE | Freq: Every day | ORAL | Status: DC
  Administered 2014-07-11: 100 mg via ORAL
  Filled 2014-07-11 (×3): qty 1

## 2014-07-11 MED ORDER — BUPROPION HCL ER (XL) 300 MG PO TB24
450.0000 mg | ORAL_TABLET | Freq: Every day | ORAL | Status: DC
  Administered 2014-07-12 – 2014-07-14 (×3): 450 mg via ORAL
  Filled 2014-07-11 (×5): qty 1

## 2014-07-11 NOTE — [facility] Group Notes (Signed)
900 nursing orientation group  The focus of this group is to educate the patient on the purpose and policies of crisis stabilization and provide a format to answer questions about their admission.  The group details unit policies and expectations of patients while admitted.  Pt did attend group and he only interacted minimal with peers and staff.

## 2014-07-11 NOTE — Progress Notes (Signed)
Pt has been up and active in the milieu today. She rated both her depression and hopelessness a 7 and his anxiety a 8 on his self-inventory. He denied any S/H ideation or A/V/H.  His goal today "get through the day" "by attending groups"

## 2014-07-11 NOTE — [facility] Group Notes (Signed)
[facility] LCSW Group Therapy  [facility] 1:15 - 2:30 PM  07/11/2014   Type of Therapy:  Group Therapy  Participation Level: Active  Participation Quality:  Attentive  Affect:  Appropriate  Cognitive:  Appropriate  Insight:  Developing/Improving   Engagement in Therapy:  Developing/Improving   Modes of Intervention:  Discussion, Education, Exploration, Problem-Solving, Rapport Building, Support   Summary of Progress/Problems:   Patient was attentive to speaker from the [facility] as he shared his story of dealing with mental health/substance abuse issues and overcoming it by working a recovery program.  Patient shared with CSW the speaker was telling his story.   [name], Jamare Vanatta Hairston 07/11/2014

## 2014-07-11 NOTE — Progress Notes (Signed)
Adult Psychoeducational Group Note  Date:  07/11/2014 Time:  10:00am Group Topic/Focus:  Making Healthy Choices:   The focus of this group is to help patients identify negative/unhealthy choices they were using prior to admission and identify positive/healthier coping strategies to replace them upon discharge.  Participation Level:  Active  Participation Quality:  Appropriate and Attentive  Affect:  Appropriate  Cognitive:  Alert and Appropriate  Insight: Appropriate  Engagement in Group:  Engaged  Modes of Intervention:  Discussion and Education  Additional Comments:  Pt attended and participated in group. Question was asked what is one choice you could make to change your life for the better. Pt stated to stop isolating and get out more and become more active.   Shelly BombardGarner, Carinna Newhart D 07/11/2014, 10:25 AM

## 2014-07-11 NOTE — Progress Notes (Signed)
Adult Psychoeducational Group Note  Date:  07/11/2014 Time:  10:25 PM  Group Topic/Focus:  Wrap-Up Group:   The focus of this group is to help patients review their daily goal of treatment and discuss progress on daily workbooks.  Participation Level:  Minimal  Participation Quality:  Appropriate  Affect:  Appropriate  Cognitive:  Alert and Appropriate  Insight: Appropriate and Good  Engagement in Group:  Supportive  Modes of Intervention:  Activity  Additional Comments:  Pt was in group engaged with others   [name] R 07/11/2014, 10:25 PM

## 2014-07-11 NOTE — Progress Notes (Signed)
[facility] MD Progress Note  07/11/2014 10:55 AM Angel Donovan  MRN:  [id number] Subjective:  States he still feels depressed but " a little better" Objective: Patient remains depressed, and intermittently tearful today. He does state he feels " a little better" and feels the medications " are helping some". Remains ruminative about father's death. He is visible on the unit, and behavior is in good control. He is tolerating medications well, and denies side effects. We reviewed labs- lipid panel remarkable for some hypertrigliceridemia and low HDL- patient encouraged to discuss with PCP after discharge and to consider dietary changes. We discussed medication options , and agrees to Wellbutrin XL titration- he has been on Wellbutrin for months, and feels it helps, albeit partially.  Diagnosis:  MDD   Total Time spent with patient: 25 minutes   ADL's: fair   Sleep: "OK"  Appetite: improving  Suicidal Ideation:  Some passive thoughts of death, but denies any suicidal ideations at this time and denies any plan or intention of hurting himself Homicidal Ideation:  Denies  AEB (as evidenced by):  Psychiatric Specialty Exam: Physical Exam  ROS  Blood pressure 101/70, pulse 104, temperature 97.6 F (36.4 C), temperature source Oral, resp. rate 18, height 5\' 7"  (1.702 m), weight 97.07 kg (214 lb).Body mass index is 33.51 kg/(m^2).  General Appearance: Fairly Groomed  Patent attorney::  Good  Speech:  Normal Rate  Volume:  Decreased  Mood:  Depressed  Affect:  Constricted and Tearful  Thought Process:  Goal Directed and Linear  Orientation:  Other:  fully alert and attentive  Thought Content:  no psychotic symptoms, remains ruminative about father's passing away  Suicidal Thoughts:  No-at this time denies any plan or intention or thoughts of hurting self and contracts for safety on the unit  Homicidal Thoughts:  No  Memory:  NA  Judgement:  Fair  Insight:  Fair  Psychomotor Activity:   Normal  Concentration:  Fair  Recall:  Good  Fund of Knowledge:Good  Language: Good  Akathisia:  Negative  Handed:  Right  AIMS (if indicated):     Assets:  Desire for Improvement Resilience  Sleep:  Number of Hours: 6.75   Musculoskeletal: Strength & Muscle Tone: within normal limits Gait & Station: normal Patient leans: N/A  Current Medications: Current Facility-Administered Medications  Medication Dose Route Frequency Provider Last Rate Last Dose  . acetaminophen (TYLENOL) tablet 650 mg  650 mg Oral Q6H PRN Kerry Hough, PA-C      . albuterol (PROVENTIL HFA;VENTOLIN HFA) 108 (90 BASE) MCG/ACT inhaler 1 puff  1 puff Inhalation Q6H PRN Kerry Hough, PA-C      . alum & mag hydroxide-simeth (MAALOX/MYLANTA) 200-200-20 MG/5ML suspension 30 mL  30 mL Oral Q4H PRN Kerry Hough, PA-C      . ARIPiprazole (ABILIFY) tablet 5 mg  5 mg Oral QHS Kerry Hough, PA-C   5 mg at 07/10/14 2112  . aspirin EC tablet 81 mg  81 mg Oral QHS Kerry Hough, PA-C   81 mg at 07/10/14 2112  . atorvastatin (LIPITOR) tablet 20 mg  20 mg Oral q1800 Kerry Hough, PA-C   20 mg at 07/10/14 1716  . benazepril (LOTENSIN) tablet 20 mg  20 mg Oral Daily Nehemiah Massed, MD   20 mg at 07/11/14 0810  . [START ON 07/12/2014] buPROPion (WELLBUTRIN XL) 24 hr tablet 450 mg  450 mg Oral Daily Nehemiah Massed, MD      .  carvedilol (COREG) tablet 25 mg  25 mg Oral BID Kerry Hough, PA-C   25 mg at 07/11/14 0454  . chlordiazePOXIDE (LIBRIUM) capsule 25 mg  25 mg Oral Q6H PRN Kerry Hough, PA-C      . chlordiazePOXIDE (LIBRIUM) capsule 25 mg  25 mg Oral TID Kerry Hough, PA-C       Followed by  . [START ON 07/12/2014] chlordiazePOXIDE (LIBRIUM) capsule 25 mg  25 mg Oral BH-qamhs Spencer E Simon, PA-C       Followed by  . [START ON 07/14/2014] chlordiazePOXIDE (LIBRIUM) capsule 25 mg  25 mg Oral Daily Kerry Hough, PA-C      . clopidogrel (PLAVIX) tablet 75 mg  75 mg Oral Daily Kerry Hough, PA-C   75  mg at 07/11/14 0981  . doxepin (SINEQUAN) capsule 100 mg  100 mg Oral QHS Nehemiah Massed, MD      . famotidine (PEPCID) tablet 20 mg  20 mg Oral BID Kerry Hough, PA-C   20 mg at 07/11/14 0815  . FLUoxetine (PROZAC) capsule 40 mg  40 mg Oral Daily Kerry Hough, PA-C   40 mg at 07/11/14 1914  . hydrochlorothiazide (HYDRODIURIL) tablet 25 mg  25 mg Oral Daily Nehemiah Massed, MD   25 mg at 07/11/14 7829  . hydrOXYzine (ATARAX/VISTARIL) tablet 25 mg  25 mg Oral Q6H PRN Kerry Hough, PA-C      . loperamide (IMODIUM) capsule 2-4 mg  2-4 mg Oral PRN Kerry Hough, PA-C      . magnesium hydroxide (MILK OF MAGNESIA) suspension 30 mL  30 mL Oral Daily PRN Kerry Hough, PA-C      . meloxicam (MOBIC) tablet 15 mg  15 mg Oral Daily Kerry Hough, PA-C   15 mg at 07/11/14 5621  . multivitamin with minerals tablet 1 tablet  1 tablet Oral Daily Kerry Hough, PA-C   1 tablet at 07/11/14 3086  . nitroGLYCERIN (NITROSTAT) SL tablet 0.4 mg  0.4 mg Sublingual Q5 min PRN Kerry Hough, PA-C      . ondansetron (ZOFRAN-ODT) disintegrating tablet 4 mg  4 mg Oral Q6H PRN Kerry Hough, PA-C      . pneumococcal 23 valent vaccine (PNU-IMMUNE) injection 0.5 mL  0.5 mL Intramuscular Tomorrow-1000 Nehemiah Massed, MD      . thiamine (B-1) injection 100 mg  100 mg Intramuscular Once Spencer E Simon, PA-C      . thiamine (VITAMIN B-1) tablet 100 mg  100 mg Oral Daily Kerry Hough, PA-C   100 mg at 07/11/14 5784  . tiZANidine (ZANAFLEX) tablet 4 mg  4 mg Oral QHS Kerry Hough, PA-C   4 mg at 07/10/14 2112    Lab Results:  Results for orders placed during the hospital encounter of 07/09/14 (from the past 48 hour(s))  LIPID PANEL     Status: Abnormal   Collection Time    07/11/14  6:20 AM      Result Value Ref Range   Cholesterol 101  0 - 200 mg/dL   Triglycerides 696 (*) <150 mg/dL   HDL 23 (*) >29 mg/dL   Total CHOL/HDL Ratio 4.4     VLDL 44 (*) 0 - 40 mg/dL   LDL Cholesterol 34  0 - 99 mg/dL    Comment:            Total Cholesterol/HDL:CHD Risk     Coronary Heart Disease Risk Table  Men   Women      1/2 Average Risk   3.4   3.3      Average Risk       5.0   4.4      2 X Average Risk   9.6   7.1      3 X Average Risk  23.4   11.0                Use the calculated Patient Ratio     above and the CHD Risk Table     to determine the patient's CHD Risk.                ATP III CLASSIFICATION (LDL):      <100     mg/dL   Optimal      [id number]  mg/dL   Near or Above                        Optimal      130-159  mg/dL   Borderline      [id number]  mg/dL   High      >956>190     mg/dL   Very High     Performed at [facility]    Physical Findings: AIMS: Facial and Oral Movements Muscles of Facial Expression: None, normal Lips and Perioral Area: None, normal Jaw: None, normal Tongue: None, normal,Extremity Movements Upper (arms, wrists, hands, fingers): None, normal Lower (legs, knees, ankles, toes): None, normal, Trunk Movements Neck, shoulders, hips: None, normal, Overall Severity Severity of abnormal movements (highest score from questions above): None, normal Incapacitation due to abnormal movements: None, normal Patient's awareness of abnormal movements (rate only patient's report): No Awareness, Dental Status Current problems with teeth and/or dentures?: No Does patient usually wear dentures?: Yes (does not have his partials)  CIWA:  CIWA-Ar Total: 4 COWS:     Assessment: patient remains depressed, tearful at times, but describes subjective improvement compared to admission and feels medications are helping. He is not suicidal or psychotic at present.  Treatment Plan Summary: Daily contact with patient to assess and evaluate symptoms and progress in treatment Medication management See below  Plan: Continue inpatient treatment/support/milieu Increase Wellbutrin XL to 450 mgrs AM Continue Prozac 40 mgrs QDAY Continue Abilify 5 mgrs  QHS Decrease Doxepin to 100 mgrs QHS Completing BZD taper.  Medical Decision Making Problem Points:  Established problem, stable/improving (1), Review of last therapy session (1) and Review of psycho-social stressors (1) Data Points:  Review or order clinical lab tests (1) Review of medication regiment & side effects (2) Review of new medications or change in dosage (2)  I certify that inpatient services furnished can reasonably be expected to improve the patient's condition.   COBOS, FERNANDO 07/11/2014, 10:55 AM

## 2014-07-11 NOTE — Progress Notes (Signed)
D. Pt has been up and has been active while in the milieu this evening, has attended and participated in various activities. Pt spoke about feeling anxious and how his day is ok, pt spoke about how they were changing his medications to help with his depression. Pt did receive medications without incident this evening. A. Support and encouragement provided, medication education given. R. Pt verbalized understanding, safety maintained.

## 2014-07-12 MED ORDER — DOXEPIN HCL 50 MG PO CAPS
150.0000 mg | ORAL_CAPSULE | Freq: Every day | ORAL | Status: DC
  Administered 2014-07-12 – 2014-07-13 (×2): 150 mg via ORAL
  Filled 2014-07-12: qty 6
  Filled 2014-07-12 (×3): qty 2
  Filled 2014-07-12: qty 6

## 2014-07-12 NOTE — Progress Notes (Signed)
Patient ID: Angel Donovan, male   DOB: [birth year], 48 y.o.   MRN: [id number] [facility] MD Progress Note  07/12/2014 3:40 PM Angel Donovan  MRN:  [id number] Subjective:  Reports improvement. Focused on discharge soon. He does report worsening insomnia related to decrease in Doxepin  Dose.  Objective: Patient reports significant improvement, and is focusing on being discharged soon.  He reports medications are "OK", and are " working pretty good"  He states " I really want to go back home  Soon and see my wife again". With his express consent, I spoke with his wife, who corroborates that he " is getting better" and with the prospect of discharge soon. At this time denies medication side effects. He is attending groups and is visible in milieu. He is less ruminative about his father's passing, and states today " I know I did all that I could do for him". Hgb A1C-6.0- patient has been told he has " prediabetes in the past".   Diagnosis:  MDD   Total Time spent with patient: 25 minutes   ADL's: improving   Sleep: states he did not sleep well, which he attributes to decrease of Doxepin dose.  Appetite: improving  Suicidal Ideation:  Denies any suicidal ideations currently Homicidal Ideation:  Denies  AEB (as evidenced by):  Psychiatric Specialty Exam: Physical Exam  Review of Systems  Constitutional: Negative for fever and chills.  Respiratory: Negative for cough and shortness of breath.   Cardiovascular: Negative for chest pain.  Gastrointestinal: Negative for vomiting.  Psychiatric/Behavioral: Positive for depression. Negative for suicidal ideas.    Blood pressure 77/49, pulse 76, temperature 97.1 F (36.2 C), temperature source Oral, resp. rate 18, height 5\' 7"  (1.702 m), weight 97.07 kg (214 lb).Body mass index is 33.51 kg/(m^2).  General Appearance: improved  Eye Contact::  Good  Speech:  Normal Rate  Volume:  Decreased  Mood:  reports improvement of his mood and minimized  depression at this time. Affect is still constricted, but more reactive and no longer tearful.  Affect:  Constricted  Thought Process:  Goal Directed and Linear  Orientation:  Other:  fully alert and attentive  Thought Content:  no hallucinations, no delusions,less focused on father's death/lloss   Suicidal Thoughts:  No-at this time denies any plan or intention or thoughts of hurting self and contracts for safety on the unit  Homicidal Thoughts:  No  Memory:  NA  Judgement:  Fair  Insight:  Fair  Psychomotor Activity:  Normal  Concentration:  Fair  Recall:  Good  Fund of Knowledge:Good  Language: Good  Akathisia:  Negative  Handed:  Right  AIMS (if indicated):     Assets:  Desire for Improvement Resilience  Sleep:  Number of Hours: 6.75   Musculoskeletal: Strength & Muscle Tone: within normal limits Gait & Station: normal Patient leans: N/A  Current Medications: Current Facility-Administered Medications  Medication Dose Route Frequency Provider Last Rate Last Dose  . acetaminophen (TYLENOL) tablet 650 mg  650 mg Oral Q6H PRN Kerry Hough, PA-C      . albuterol (PROVENTIL HFA;VENTOLIN HFA) 108 (90 BASE) MCG/ACT inhaler 1 puff  1 puff Inhalation Q6H PRN Kerry Hough, PA-C   1 puff at 07/12/14 0806  . alum & mag hydroxide-simeth (MAALOX/MYLANTA) 200-200-20 MG/5ML suspension 30 mL  30 mL Oral Q4H PRN Kerry Hough, PA-C      . ARIPiprazole (ABILIFY) tablet 5 mg  5 mg Oral QHS Kerry Hough,  PA-C   5 mg at 07/11/14 2112  . aspirin EC tablet 81 mg  81 mg Oral QHS Kerry Hough, PA-C   81 mg at 07/11/14 2112  . atorvastatin (LIPITOR) tablet 20 mg  20 mg Oral q1800 Kerry Hough, PA-C   20 mg at 07/11/14 1728  . benazepril (LOTENSIN) tablet 20 mg  20 mg Oral Daily Nehemiah Massed, MD   20 mg at 07/12/14 1610  . buPROPion (WELLBUTRIN XL) 24 hr tablet 450 mg  450 mg Oral Daily Nehemiah Massed, MD   450 mg at 07/12/14 0807  . carvedilol (COREG) tablet 25 mg  25 mg Oral BID  Kerry Hough, PA-C   25 mg at 07/12/14 9604  . chlordiazePOXIDE (LIBRIUM) capsule 25 mg  25 mg Oral Q6H PRN Kerry Hough, PA-C      . chlordiazePOXIDE (LIBRIUM) capsule 25 mg  25 mg Oral BH-qamhs Spencer E Simon, PA-C       Followed by  . [START ON 07/14/2014] chlordiazePOXIDE (LIBRIUM) capsule 25 mg  25 mg Oral Daily Kerry Hough, PA-C      . clopidogrel (PLAVIX) tablet 75 mg  75 mg Oral Daily Kerry Hough, PA-C   75 mg at 07/12/14 5409  . doxepin (SINEQUAN) capsule 100 mg  100 mg Oral QHS Nehemiah Massed, MD   100 mg at 07/11/14 2112  . famotidine (PEPCID) tablet 20 mg  20 mg Oral BID Kerry Hough, PA-C   20 mg at 07/12/14 8119  . FLUoxetine (PROZAC) capsule 40 mg  40 mg Oral Daily Kerry Hough, PA-C   40 mg at 07/12/14 1478  . hydrochlorothiazide (HYDRODIURIL) tablet 25 mg  25 mg Oral Daily Nehemiah Massed, MD   25 mg at 07/12/14 2956  . hydrOXYzine (ATARAX/VISTARIL) tablet 25 mg  25 mg Oral Q6H PRN Kerry Hough, PA-C      . loperamide (IMODIUM) capsule 2-4 mg  2-4 mg Oral PRN Kerry Hough, PA-C      . magnesium hydroxide (MILK OF MAGNESIA) suspension 30 mL  30 mL Oral Daily PRN Kerry Hough, PA-C      . meloxicam (MOBIC) tablet 15 mg  15 mg Oral Daily Kerry Hough, PA-C   15 mg at 07/12/14 2130  . multivitamin with minerals tablet 1 tablet  1 tablet Oral Daily Kerry Hough, PA-C   1 tablet at 07/12/14 8657  . nitroGLYCERIN (NITROSTAT) SL tablet 0.4 mg  0.4 mg Sublingual Q5 min PRN Kerry Hough, PA-C      . ondansetron (ZOFRAN-ODT) disintegrating tablet 4 mg  4 mg Oral Q6H PRN Kerry Hough, PA-C      . pneumococcal 23 valent vaccine (PNU-IMMUNE) injection 0.5 mL  0.5 mL Intramuscular Tomorrow-1000 Nehemiah Massed, MD      . thiamine (B-1) injection 100 mg  100 mg Intramuscular Once Spencer E Simon, PA-C      . thiamine (VITAMIN B-1) tablet 100 mg  100 mg Oral Daily Kerry Hough, PA-C   100 mg at 07/12/14 8469  . tiZANidine (ZANAFLEX) tablet 4 mg  4 mg Oral  QHS Kerry Hough, PA-C   4 mg at 07/11/14 2112    Lab Results:  Results for orders placed during the hospital encounter of 07/09/14 (from the past 48 hour(s))  HEMOGLOBIN A1C     Status: Abnormal   Collection Time    07/11/14  6:20 AM      Result  Value Ref Range   Hemoglobin A1C 6.0 (*) <5.7 %   Comment: (NOTE)                                                                               According to the ADA Clinical Practice Recommendations for 2011, when     HbA1c is used as a screening test:      >=6.5%   Diagnostic of Diabetes Mellitus               (if abnormal result is confirmed)     5.7-6.4%   Increased risk of developing Diabetes Mellitus     References:Diagnosis and Classification of Diabetes Mellitus,Diabetes     Care,2011,34(Suppl 1):S62-S69 and Standards of Medical Care in             Diabetes - 2011,Diabetes Care,2011,34 (Suppl 1):S11-S61.   Mean Plasma Glucose 126 (*) <117 mg/dL   Comment: Performed at [facility]  LIPID PANEL     Status: Abnormal   Collection Time    07/11/14  6:20 AM      Result Value Ref Range   Cholesterol 101  0 - 200 mg/dL   Triglycerides 409 (*) <150 mg/dL   HDL 23 (*) >81 mg/dL   Total CHOL/HDL Ratio 4.4     VLDL 44 (*) 0 - 40 mg/dL   LDL Cholesterol 34  0 - 99 mg/dL   Comment:            Total Cholesterol/HDL:CHD Risk     Coronary Heart Disease Risk Table                         Men   Women      1/2 Average Risk   3.4   3.3      Average Risk       5.0   4.4      2 X Average Risk   9.6   7.1      3 X Average Risk  23.4   11.0                Use the calculated Patient Ratio     above and the CHD Risk Table     to determine the patient's CHD Risk.                ATP III CLASSIFICATION (LDL):      <100     mg/dL   Optimal      191-478  mg/dL   Near or Above                        Optimal      130-159  mg/dL   Borderline      295-621  mg/dL   High      >308     mg/dL   Very High     Performed at [facility]     Physical Findings: AIMS: Facial and Oral Movements Muscles of Facial Expression: None, normal Lips and Perioral Area: None, normal Jaw: None, normal Tongue: None, normal,Extremity Movements Upper (arms, wrists, hands, fingers): None,  normal Lower (legs, knees, ankles, toes): None, normal, Trunk Movements Neck, shoulders, hips: None, normal, Overall Severity Severity of abnormal movements (highest score from questions above): None, normal Incapacitation due to abnormal movements: None, normal Patient's awareness of abnormal movements (rate only patient's report): No Awareness, Dental Status Current problems with teeth and/or dentures?: No Does patient usually wear dentures?: Yes (does not have his partials)  CIWA:  CIWA-Ar Total: 0 COWS:     Assessment:  Patient is improving- he reports his mood is better and at this time minimizes depression, although his affect does remain somewhat constricted. Wife , whom I spoke with over the phone. Agrees that he is better and can be discharged home soon. He does complain of insomnia, which worsened on lower Doxepin dose. He has been on Doxepin without side effects for several years.   Treatment Plan Summary: Daily contact with patient to assess and evaluate symptoms and progress in treatment Medication management See below  Plan: Continue inpatient treatment/support/milieu ContinueWellbutrin XL 450 mgrs AM Continue Prozac 40 mgrs QDAY Continue Abilify 5 mgrs QHS Increase Doxepin to 150 mgrs QHS Consider Discharge this weekend if he continues to improve, stabilize. Plans to return home and [facility] [place]. For medical issues has an established PCP, Dr. Laury DeepHusanji, in [place], [place], with whom he will follow up for medical issues.  Medical Decision Making Problem Points:  Established problem, stable/improving (1) and Review of psycho-social stressors (1) Data Points:  Review of medication regiment & side effects  (2) Review of new medications or change in dosage (2)  I certify that inpatient services furnished can reasonably be expected to improve the patient's condition.   COBOS, FERNANDO 07/12/2014, 3:40 PM

## 2014-07-12 NOTE — Progress Notes (Signed)
Adult Psychoeducational Group Note  Date:  07/12/2014 Time:  10:59 AM  Group Topic/Focus:  Relapse Prevention Planning:   The focus of this group is to define relapse and discuss the need for planning to combat relapse.  Participation Level:  Minimal  Participation Quality:  Appropriate  Affect:  Depressed  Cognitive:  Alert  Insight: Appropriate  Engagement in Group:  Engaged  Modes of Intervention:  Discussion  Additional Comments:  Patient states that he is learning to not to take life so seriously and to spend more time with his wife.  Juanda Chanceowlin, Hezekiah Veltre Jvette 07/12/2014, 10:59 AM

## 2014-07-12 NOTE — Tx Team (Signed)
Interdisciplinary Treatment Plan Update   Date Reviewed:  07/12/2014  Time Reviewed:  8:31 AM  Progress in Treatment:   Attending groups: Yes Participating in groups: Yes Taking medication as prescribed: Yes  Tolerating medication: Yes Family/Significant other contact made:  No, patient declined collateral contact Patient understands diagnosis: Yes  Discussing patient identified problems/goals with staff: Yes Medical problems stabilized or resolved: Yes Denies suicidal/homicidal ideation: Yes Patient has not harmed self or others: Yes  For review of initial/current patient goals, please see plan of care.  Estimated Length of Stay:  3-4 days  Reasons for Continued Hospitalization:  Anxiety Depression Medication stabilization   New Problems/Goals identified:    Discharge Plan or Barriers:   Home with outpatient follow up with [facility] [place]  Additional Comments:  Continue stabilization  Attendees:  Patient:  07/12/2014 8:31 AM   Signature:  Sallyanne HaversF. Cobos, MD 07/12/2014 8:31 AM  Signature:  07/12/2014 8:31 AM  Signature:  Harold Barbanonecia Byrd, RN 07/12/2014 8:31 AM  Signature: 07/12/2014 8:31 AM  Signature:  07/12/2014 8:31 AM  Signature:  Juline PatchQuylle Eiden Bagot, LCSW 07/12/2014 8:31 AM  Signature:  Belenda CruiseKristin Drinkard, LCSW-A 07/12/2014 8:31 AM  Signature:  Leisa LenzValerie Enoch, Care Coordinator [facility] 07/12/2014 8:31 AM  Signature:   07/12/2014 8:31 AM  Signature:  07/12/2014  8:31 AM  Signature:   , RN URCM 07/12/2014  8:31 AM  Signature: 07/12/2014  8:31 AM    Scribe for Treatment Team:   Juline PatchQuylle Kiah Vanalstine,  07/12/2014 8:31 AM

## 2014-07-12 NOTE — [facility] Group Notes (Signed)
[facility] Note   07/12/2014 10:49 AM    Participation Quality:  Appropraite  Mood/Affect:  Appropriate  Depression Rating:  5  Anxiety Rating:  4  Thoughts of Suicide:  No  Will you contract for safety?   NA  Current AVH:  No  Plan for Discharge/Comments:  Patient attended discharge planning group and actively participated in group.  He will return to his home and [facility].  CSW provided all participants with daily workbook.   Transportation Means: Patient has transportation.   Supports:  Patient has a support system.   Sasha Rogel, Joesph JulyQuylle Hairston

## 2014-07-12 NOTE — Progress Notes (Signed)
Angel Donovan is seen OOB UAL on the 500 hall today. HE is quiet and keeps to himself. HE walks slowly, talks slowly and is dulled and limited in his engagements..   A He completes his morning assessment sheet and t he writes  He denies SI within the past 24 hrs and he rates his depression, hopelessness and anxiety 5/5/8".     R Safety is in place and poc moves forward.

## 2014-07-12 NOTE — Progress Notes (Signed)
Assumed care of patient at 2300. Patient has rested well tonight. Observed with even and unlabored RR. No acute distress, no complaints voiced. Level III obs in place and pt is safe. Lawrence MarseillesFriedman, Hervey Wedig Eakes

## 2014-07-12 NOTE — BHH Group Notes (Signed)
Adult Psychoeducational Group Note  Date:  07/12/2014 Time:  9:00 PM  Group Topic/Focus:  Wrap-Up Group:   The focus of this group is to help patients review their daily goal of treatment and discuss progress on daily workbooks.  Participation Level:  Active  Participation Quality:  Appropriate  Affect:  Appropriate  Cognitive:  Appropriate  Insight: Good  Engagement in Group:  Engaged  Modes of Intervention:  Confrontation  Additional Comments:  Pt stated he had a good day and his depression is a 4 out of a ten. Pt stated that he is  typically a introvert and one of his goals for the day  was to open up and interact with the other patients and he did. Pt stated he sat in the day room most of the day and spoke to several people.  Berlin HunWatlington, Ahrianna Siglin A 07/12/2014, 9:00 PM

## 2014-07-12 NOTE — [facility] Group Notes (Signed)
[facility] LCSW Group Therapy  Feelings Around Relapse 1:15 -2:30        07/12/2014   Type of Therapy:  Group Therapy  Participation Level:  Appropriate  Participation Quality:  Appropriate  Affect:  Appropriate  Cognitive:  Attentive Appropriate  Insight:  Developing/Improving  Engagement in Therapy: Developing/Improving  Modes of Intervention:  Discussion Exploration Problem-Solving Supportive  Summary of Progress/Problems:  The topic for today was feelings around relapse.    Patient processed feelings toward relapse and was able to relate to peers. Patient shared he had spent a lot of time in isolation prior to admission.  Patient identified coping skills that can be used to prevent a relapse is to get out of the house more and [facility].   Angel Donovan, Angel Donovan 07/12/2014

## 2014-07-13 NOTE — Progress Notes (Signed)
[facility] MD Progress Note  07/13/2014 4:39 PM Angel Donovan  MRN:  [id number] Subjective:  States better he is anticipating discharge tomorrow and going home with his wife. He plans to get out of the house some, and quit walking the floors. He states he has to realize that he is not responsible for his daddies death and he did what he asked him to do which was take him. He will continue classes at [facility] recovery for PTSD, however they incorporate grieving as well. He currently rates his depression 4/10, anxiety 8/10, and hopelessness 0/10. Denies SI/HI/AVH.   Objective: Remains ruminative about father's death but understands he cant control what happened. He is visible on the unit, and behavior is in good control. He is tolerating medications well, and denies side effects.  Diagnosis:  MDD   Total Time spent with patient: 25 minutes   ADL's: fair   Sleep: "OK"  Appetite: improving  Suicidal Ideation:  Some passive thoughts of death, but denies any suicidal ideations at this time and denies any plan or intention of hurting himself Homicidal Ideation:  Denies  AEB (as evidenced by):  Psychiatric Specialty Exam: Physical Exam   ROS   Blood pressure 104/69, pulse 73, temperature 97.9 F (36.6 C), temperature source Oral, resp. rate 16, height 5\' 7"  (1.702 m), weight 97.07 kg (214 lb).Body mass index is 33.51 kg/(m^2).  General Appearance: Fairly Groomed  Patent attorney::  Good  Speech:  Normal Rate  Volume:  Decreased  Mood:  Depressed  Affect:  Constricted and Tearful  Thought Process:  Goal Directed and Linear  Orientation:  Other:  fully alert and attentive  Thought Content:  no psychotic symptoms, remains ruminative about father's passing away  Suicidal Thoughts:  No-at this time denies any plan or intention or thoughts of hurting self and contracts for safety on the unit  Homicidal Thoughts:  No  Memory:  NA  Judgement:  Fair  Insight:  Fair  Psychomotor Activity:  Normal   Concentration:  Fair  Recall:  Good  Fund of Knowledge:Good  Language: Good  Akathisia:  Negative  Handed:  Right  AIMS (if indicated):     Assets:  Desire for Improvement Resilience  Sleep:  Number of Hours: 6.75   Musculoskeletal: Strength & Muscle Tone: within normal limits Gait & Station: normal Patient leans: N/A  Current Medications: Current Facility-Administered Medications  Medication Dose Route Frequency Provider Last Rate Last Dose  . acetaminophen (TYLENOL) tablet 650 mg  650 mg Oral Q6H PRN Kerry Hough, PA-C      . albuterol (PROVENTIL HFA;VENTOLIN HFA) 108 (90 BASE) MCG/ACT inhaler 1 puff  1 puff Inhalation Q6H PRN Kerry Hough, PA-C   1 puff at 07/13/14 0818  . alum & mag hydroxide-simeth (MAALOX/MYLANTA) 200-200-20 MG/5ML suspension 30 mL  30 mL Oral Q4H PRN Kerry Hough, PA-C      . ARIPiprazole (ABILIFY) tablet 5 mg  5 mg Oral QHS Kerry Hough, PA-C   5 mg at 07/12/14 2120  . aspirin EC tablet 81 mg  81 mg Oral QHS Kerry Hough, PA-C   81 mg at 07/12/14 2120  . atorvastatin (LIPITOR) tablet 20 mg  20 mg Oral q1800 Kerry Hough, PA-C   20 mg at 07/12/14 1814  . benazepril (LOTENSIN) tablet 20 mg  20 mg Oral Daily Nehemiah Massed, MD   20 mg at 07/13/14 0819  . buPROPion (WELLBUTRIN XL) 24 hr tablet 450 mg  450  mg Oral Daily Nehemiah MassedFernando Cobos, MD   450 mg at 07/13/14 [id number]  . carvedilol (COREG) tablet 25 mg  25 mg Oral BID Kerry HoughSpencer E Simon, PA-C   25 mg at 07/13/14 [id number]  . [START ON 07/14/2014] chlordiazePOXIDE (LIBRIUM) capsule 25 mg  25 mg Oral Daily Kerry HoughSpencer E Simon, PA-C      . clopidogrel (PLAVIX) tablet 75 mg  75 mg Oral Daily Kerry HoughSpencer E Simon, PA-C   75 mg at 07/13/14 0819  . doxepin (SINEQUAN) capsule 150 mg  150 mg Oral QHS Nehemiah MassedFernando Cobos, MD   150 mg at 07/12/14 2120  . famotidine (PEPCID) tablet 20 mg  20 mg Oral BID Kerry HoughSpencer E Simon, PA-C   20 mg at 07/13/14 [id number]  . FLUoxetine (PROZAC) capsule 40 mg  40 mg Oral Daily Kerry HoughSpencer E Simon, PA-C   40 mg  at 07/13/14 0818  . hydrochlorothiazide (HYDRODIURIL) tablet 25 mg  25 mg Oral Daily Nehemiah MassedFernando Cobos, MD   25 mg at 07/13/14 0819  . magnesium hydroxide (MILK OF MAGNESIA) suspension 30 mL  30 mL Oral Daily PRN Kerry HoughSpencer E Simon, PA-C      . meloxicam (MOBIC) tablet 15 mg  15 mg Oral Daily Kerry HoughSpencer E Simon, PA-C   15 mg at 07/13/14 0818  . multivitamin with minerals tablet 1 tablet  1 tablet Oral Daily Kerry HoughSpencer E Simon, PA-C   1 tablet at 07/13/14 0818  . nitroGLYCERIN (NITROSTAT) SL tablet 0.4 mg  0.4 mg Sublingual Q5 min PRN Kerry HoughSpencer E Simon, PA-C      . pneumococcal 23 valent vaccine (PNU-IMMUNE) injection 0.5 mL  0.5 mL Intramuscular Tomorrow-1000 Nehemiah MassedFernando Cobos, MD      . thiamine (B-1) injection 100 mg  100 mg Intramuscular Once Spencer E Simon, PA-C      . thiamine (VITAMIN B-1) tablet 100 mg  100 mg Oral Daily Kerry HoughSpencer E Simon, PA-C   100 mg at 07/13/14 0800  . tiZANidine (ZANAFLEX) tablet 4 mg  4 mg Oral QHS Kerry HoughSpencer E Simon, PA-C   4 mg at 07/12/14 2120    Lab Results:  No results found for this or any previous visit (from the past 48 hour(s)).  Physical Findings: AIMS: Facial and Oral Movements Muscles of Facial Expression: None, normal Lips and Perioral Area: None, normal Jaw: None, normal Tongue: None, normal,Extremity Movements Upper (arms, wrists, hands, fingers): None, normal Lower (legs, knees, ankles, toes): None, normal, Trunk Movements Neck, shoulders, hips: None, normal, Overall Severity Severity of abnormal movements (highest score from questions above): None, normal Incapacitation due to abnormal movements: None, normal Patient's awareness of abnormal movements (rate only patient's report): No Awareness, Dental Status Current problems with teeth and/or dentures?: No Does patient usually wear dentures?: No  CIWA:  CIWA-Ar Total: 0 COWS:     Assessment: patient remains depressed, tearful at times, but describes subjective improvement compared to admission and feels  medications are helping. He is not suicidal or psychotic at present.  Treatment Plan Summary: Daily contact with patient to assess and evaluate symptoms and progress in treatment Medication management See below  Plan: Continue inpatient treatment/support/milieu Continue Wellbutrin XL to 450 mgrs AM Continue Prozac 40 mgrs QDAY Continue Abilify 5 mgrs QHS Decrease Doxepin to 100 mgrs QHS Completing BZD taper.  Medical Decision Making Problem Points:  Established problem, stable/improving (1), Review of last therapy session (1) and Review of psycho-social stressors (1) Data Points:  Review or order clinical lab tests (1) Review of medication regiment & side  effects (2) Review of new medications or change in dosage (2)  I certify that inpatient services furnished can reasonably be expected to improve the patient's condition.   Malachy Chamber S FNP-BC 07/13/2014, 4:39 PM

## 2014-07-13 NOTE — Progress Notes (Signed)
Case discussed with NP.  Chart reviewed. Agree with above assessment and plan.  Yuli Lanigan, MD 

## 2014-07-13 NOTE — [facility] Group Notes (Signed)
[facility] Group Notes:  (Clinical Social Work)  07/13/2014   1:15-2:15PM  Summary of Progress/Problems:   The main focus of today's process group was for the patient to identify ways in which they have sabotaged their own mental health wellness/recovery.  Motivational interviewing and a handout were used to explore the benefits and costs of their self-sabotaging behavior as well as the benefits and costs of changing this behavior.  The Stages of Change were explained to the group using a handout, and patients identified where they are with regard to changing self-defeating behaviors.  The patient expressed he self-sabotages with isolation and is currently in the Preparation Stage of Change.  Type of Therapy:  Process Group  Participation Level:  Active  Participation Quality:  Attentive and Sharing  Affect:  Blunted and Depressed  Cognitive:  Oriented  Insight:  Developing/Improving  Engagement in Therapy:  Engaged  Modes of Intervention:  Education, Motivational Interviewing   Ambrose MantleMareida Grossman-Orr, LCSW 07/13/2014, 4:00pm

## 2014-07-13 NOTE — Progress Notes (Signed)
Angel Donovan is seen OOB UAL on the 500 hall today. Angel Donovan remains sad, distant and depressed. Angel Donovan takes all of his emdications as scheduled.    A Angel Donovan completes his morning assessment and writes on it "4/4/7" when asked to rate his feelings of depression, hopelessness and anxiety. Angel Donovan deneis SI within the p-ast 24 hrs.    R Safety is in place and poc cont.
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
## 2014-07-13 NOTE — Progress Notes (Signed)
Took over Pt's care at 2330.  Pt resting in bed with eyes closed, respirations even and unlabored.  No distress noted.  Will continue to monitor.  Pt remains safe on unit.

## 2014-07-14 DIAGNOSIS — F332 Major depressive disorder, recurrent severe without psychotic features: Principal | ICD-10-CM

## 2014-07-14 MED ORDER — FAMOTIDINE 20 MG PO TABS: 20.0000 mg | ORAL_TABLET | Freq: Two times a day (BID) | ORAL | Status: DC

## 2014-07-14 MED ORDER — ASPIRIN 81 MG PO TABS: 81.0000 mg | ORAL_TABLET | Freq: Every day | ORAL | Status: DC

## 2014-07-14 MED ORDER — DOXEPIN HCL 150 MG PO CAPS: 150.0000 mg | ORAL_CAPSULE | Freq: Every day | ORAL | Status: DC

## 2014-07-14 MED ORDER — BUPROPION HCL ER (XL) 450 MG PO TB24: 450.0000 mg | ORAL_TABLET | Freq: Every day | ORAL | Status: DC

## 2014-07-14 MED ORDER — CLOPIDOGREL BISULFATE 75 MG PO TABS: 75.0000 mg | ORAL_TABLET | Freq: Every day | ORAL | Status: DC

## 2014-07-14 MED ORDER — ALBUTEROL SULFATE HFA 108 (90 BASE) MCG/ACT IN AERS: 1.0000 | INHALATION_SPRAY | Freq: Four times a day (QID) | RESPIRATORY_TRACT | Status: DC | PRN

## 2014-07-14 MED ORDER — ARIPIPRAZOLE 5 MG PO TABS: 5.0000 mg | ORAL_TABLET | Freq: Every day | ORAL | Status: DC

## 2014-07-14 MED ORDER — BENAZEPRIL-HYDROCHLOROTHIAZIDE 20-25 MG PO TABS: 1.0000 | ORAL_TABLET | Freq: Every day | ORAL | Status: DC

## 2014-07-14 MED ORDER — TIZANIDINE HCL 4 MG PO TABS: 4.0000 mg | ORAL_TABLET | Freq: Every day | ORAL | Status: DC

## 2014-07-14 MED ORDER — FISH OIL 1000 MG PO CAPS: 1000.0000 mg | ORAL_CAPSULE | Freq: Two times a day (BID) | ORAL | Status: DC

## 2014-07-14 MED ORDER — CARVEDILOL 25 MG PO TABS: 25.0000 mg | ORAL_TABLET | Freq: Two times a day (BID) | ORAL | Status: DC

## 2014-07-14 MED ORDER — FLUOXETINE HCL 40 MG PO CAPS: 40.0000 mg | ORAL_CAPSULE | Freq: Every day | ORAL | Status: DC

## 2014-07-14 MED ORDER — MELOXICAM 15 MG PO TABS: 15.0000 mg | ORAL_TABLET | Freq: Every day | ORAL | Status: DC

## 2014-07-14 MED ORDER — BUPROPION HCL ER (XL) 150 MG PO TB24
450.0000 mg | ORAL_TABLET | Freq: Every day | ORAL | Status: DC
  Filled 2014-07-14 (×2): qty 6

## 2014-07-14 MED ORDER — NITROGLYCERIN 0.4 MG SL SUBL: 0.4000 mg | SUBLINGUAL_TABLET | SUBLINGUAL | Status: DC | PRN

## 2014-07-14 MED ORDER — ATORVASTATIN CALCIUM 20 MG PO TABS: 20.0000 mg | ORAL_TABLET | Freq: Every day | ORAL | Status: DC

## 2014-07-14 NOTE — Discharge Summary (Signed)
Physician Discharge Summary Note  Patient:  Angel Donovan is an 48 y.o., male MRN:  [id number] DOB:  [birth year] Patient phone:  [phone] (home)  Patient address:   [address] [place] [address],  Total Time spent with patient: 20 minutes  Date of Admission:  07/09/2014 Date of Discharge: 07/14/14  Reason for Admission:  Depression  Discharge Diagnoses: Active Problems:   Alcohol-induced mood disorder  Psychiatric Specialty Exam: Physical Exam  Review of Systems  Constitutional: Negative.   HENT: Negative.   Eyes: Negative.   Respiratory: Negative.   Cardiovascular: Negative.   Gastrointestinal: Negative.   Genitourinary: Negative.   Musculoskeletal: Negative.   Skin: Negative.   Neurological: Negative.   Endo/Heme/Allergies: Negative.   Psychiatric/Behavioral: Negative.     Blood pressure 92/65, pulse 66, temperature 98.2 F (36.8 C), temperature source Oral, resp. rate 16, height  (1.702 m), weight 97.07 kg (214 lb).Body mass index is 33.51 kg/(m^2).  See Physician SRA                                                  Past Psychiatric History: See H&P Diagnosis:  Hospitalizations:  Outpatient Care:  Substance Abuse Care:  Self-Mutilation:  Suicidal Attempts:  Violent Behaviors:   Musculoskeletal: Strength & Muscle Tone: within normal limits Gait & Station: normal Patient leans: N/A  DSM5:  AXIS I: Major Depression, Recurrent severe  AXIS II: Deferred  AXIS III:  Past Medical History   Diagnosis  Date   .  Coronary artery disease      Inferior MI November, 2011, bare-metal stent large circumflex, moderate LAD disease / catheterization June, 2012 LAD unchanged, moderate in-stent restenosis circumflex, improved LV function, EF 45%, medical therapy   .  Ejection fraction < 50%      EF 30% echo November, 2011 (MI) / EF 40%, echo, months after MI / EF 45%, cardiac catheterization, June, 201 to   .  CHF (congestive heart  failure)      Initially post MI, then improved.   .  Depression    .  Hepatitis C    .  Hypertension    .  History of cocaine abuse      in 80's, ex-IVDU   .  History of alcohol abuse    .  Pulmonary nodule    .  Anal fissure    .  Dizziness      November, 2013   .  Carotid artery disease      Doppler, June, 2014, 1-39% bilateral    AXIS IV: other psychosocial or environmental problems  AXIS V: 61-70 mild symptoms   Level of Care:  OP  Hospital Course:  Angel Donovan is a 48 year old man, who reports worsening depression since his father passed away in 2023/01/25. States he has been feeling very depressed, and has had feelings of guilt associated with his father's death " like he died on my watch, and it makes me feel terrible, even though I know I did what I could". He is not endorsing Psychotic symptoms and no delusional ideations are noted at this time. He is also endorsing anxiety and sometimes feeling overwhelmed related to having to deal with father's estate issues. He has been having suicidal ideations, with a thought of shooting himself, and states he actually gave his firearm away  to a friend as a precaution.          Angel Donovan was admitted to the adult 500 unit. He was evaluated and his symptoms were identified. Medication management was discussed and initiated. His Wellbutrin XL was increased during his admission to 450 mg daily. His home medications of Abilify, Doxepin, and Prozac were continued. He was oriented to the unit and encouraged to participate in unit programming. Medical problems were identified and treated appropriately. Home medication was restarted as needed.        The patient was evaluated each day by a clinical provider to ascertain the patient's response to treatment. Patient continued to express grief about his father's death. He was noted to ruminate over feeling guilty about aspects of the death. The patient attended groups but at times was noted to  isolate himself on the unit. Improvement was noted by the patient's report of decreasing symptoms, improved sleep and appetite, affect, medication tolerance, behavior, and participation in unit programming.  He was asked each day to complete a self inventory noting mood, mental status, pain, new symptoms, anxiety and concerns.         He responded well to medication and being in a therapeutic and supportive environment. Positive and appropriate behavior was noted and the patient was motivated for recovery.  The patient worked closely with the treatment team and case manager to develop a discharge plan with appropriate goals. Coping skills, problem solving as well as relaxation therapies were also part of the unit programming.         By the day of discharge he was in much improved condition than upon admission.  Symptoms were reported as significantly decreased or resolved completely.  The patient denied SI/HI and voiced no AVH. He was motivated to continue taking medication with a goal of continued improvement in mental health. Angel Donovan was discharged home with a plan to follow up as noted below.  Consults:  None  Significant Diagnostic Studies:  Chemistry panel, CBC, A1C, Lipid profile   Discharge Vitals:   Blood pressure 92/65, pulse 66, temperature 98.2 F (36.8 C), temperature source Oral, resp. rate 16, height  (1.702 m), weight 97.07 kg (214 lb). Body mass index is 33.51 kg/(m^2). Lab Results:   No results found for this or any previous visit (from the past 72 hour(s)).  Physical Findings: AIMS: Facial and Oral Movements Muscles of Facial Expression: None, normal Lips and Perioral Area: None, normal Jaw: None, normal Tongue: None, normal,Extremity Movements Upper (arms, wrists, hands, fingers): None, normal Lower (legs, knees, ankles, toes): None, normal, Trunk Movements Neck, shoulders, hips: None, normal, Overall Severity Severity of abnormal movements (highest score  from questions above): None, normal Incapacitation due to abnormal movements: None, normal Patient's awareness of abnormal movements (rate only patient's report): No Awareness, Dental Status Current problems with teeth and/or dentures?: No Does patient usually wear dentures?: No  CIWA:  CIWA-Ar Total: 2 COWS:     Psychiatric Specialty Exam: See Psychiatric Specialty Exam and Suicide Risk Assessment completed by Attending Physician prior to discharge.  Discharge destination:  Home  Is patient on multiple antipsychotic therapies at discharge:  No   Has Patient had three or more failed trials of antipsychotic monotherapy by history:  No  Recommended Plan for Multiple Antipsychotic Therapies: NA     Medication List    STOP taking these medications       diazepam 5 MG tablet  Commonly known as:  VALIUM  TAKE these medications     Indication   albuterol 108 (90 BASE) MCG/ACT inhaler  Commonly known as:  PROAIR HFA  Inhale 1 puff into the lungs every 6 (six) hours as needed.   Indication:  Disease involving Spasms of the Lungs     ARIPiprazole 5 MG tablet  Commonly known as:  ABILIFY  Take 1 tablet (5 mg total) by mouth at bedtime.   Indication:  Major Depressive Disorder     aspirin 81 MG tablet  Take 1 tablet (81 mg total) by mouth at bedtime.   Indication:  Blood Clot     atorvastatin 20 MG tablet  Commonly known as:  LIPITOR  Take 1 tablet (20 mg total) by mouth daily.   Indication:  Disease of the Heart and Blood Vessels     benazepril-hydrochlorthiazide 20-25 MG per tablet  Commonly known as:  LOTENSIN HCT  Take 1 tablet by mouth daily.   Indication:  High Blood Pressure     BuPROPion HCl ER (XL) 450 MG Tb24  Take 450 mg by mouth daily.   Indication:  Major Depressive Disorder     carvedilol 25 MG tablet  Commonly known as:  COREG  Take 1 tablet (25 mg total) by mouth 2 (two) times daily.   Indication:  High Blood Pressure of Unknown Cause      clopidogrel 75 MG tablet  Commonly known as:  PLAVIX  Take 1 tablet (75 mg total) by mouth daily.   Indication:  Heart Attack     doxepin 150 MG capsule  Commonly known as:  SINEQUAN  Take 1 capsule (150 mg total) by mouth at bedtime.   Indication:  Depression     famotidine 20 MG tablet  Commonly known as:  PEPCID  Take 1 tablet (20 mg total) by mouth 2 (two) times daily.   Indication:  Gastroesophageal Reflux Disease     Fish Oil 1000 MG Caps  Take 1 capsule (1,000 mg total) by mouth 2 (two) times daily.   Indication:  High Blood Pressure, Blood Vessel Disease     FLUoxetine 40 MG capsule  Commonly known as:  PROZAC  Take 1 capsule (40 mg total) by mouth daily.   Indication:  Depression     meloxicam 15 MG tablet  Commonly known as:  MOBIC  Take 1 tablet (15 mg total) by mouth daily.   Indication:  Joint Damage causing Pain and Loss of Function     nitroGLYCERIN 0.4 MG SL tablet  Commonly known as:  NITROSTAT  Place 1 tablet (0.4 mg total) under the tongue every 5 (five) minutes as needed.   Indication:  Acute Angina Pectoris     tiZANidine 4 MG tablet  Commonly known as:  ZANAFLEX  Take 1 tablet (4 mg total) by mouth at bedtime.   Indication:  Muscle Spasticity           Follow-up Information   Follow up with [facility] On 07/11/2014. (You are scheduled with [facility] on )    Contact information:   [address] [place], [place]   [address]  [phone]     Follow-up recommendations:   Activity: as tolerated  Diet: per PCP  Tests: per PCP  Other: f/u at [facility] as scheduled. Pt reports sobriety from alcohol/cocaine for 15 years.  Comments:   Take all your medications as prescribed by your mental healthcare provider.  Report any adverse effects and or reactions from your medicines to your outpatient provider  promptly.  Patient is instructed and cautioned to not engage in alcohol and or illegal drug use while on prescription medicines.  In the event of worsening  symptoms, patient is instructed to call the crisis hotline, 911 and or go to the nearest ED for appropriate evaluation and treatment of symptoms.  Follow-up with your primary care provider for your other medical issues, concerns and or health care needs.   Total Discharge Time:  Greater than 30 minutes.  SignedFransisca Kaufmann NP-C 07/14/2014, 9:41 AM

## 2014-07-14 NOTE — [facility] Group Notes (Addendum)
0900 nursing orientation group   The focus of this group is to educate the patient on the purpose and policies of crisis stabilization and provide a format to answer questions about their admission.  The group details unit policies and expectations of patients while admitted.  Pt was an active participant he was appropriate with his sharing in group.

## 2014-07-14 NOTE — Progress Notes (Signed)
Psychoeducational Group Note  Date: 07/14/2014 Time: 1045  Group Topic/Focus:  Making Healthy Choices:   The focus of this group is to help patients identify negative/unhealthy choices they were using prior to admission and identify positive/healthier coping strategies to replace them upon discharge.  Participation Level:  Did Not Attend  Participation Quality:  Inattentive  Affect:  Depressed  Cognitive:  Disorganized  Insight:  Lacking  Engagement in Group:  Lacking  Additional Comments:    07/14/2014,3:40 PM Prashant Glosser, Joie Bimler

## 2014-07-14 NOTE — [facility] Group Notes (Signed)
[facility] Group Notes:  (Clinical Social Work)  07/14/2014   1:15-2:15PM  Summary of Progress/Problems:  The main focus of today's process group was to   identify the patient's current support system and decide on other supports that can be put in place.  The picture on workbook was used to discuss why additional supports are needed.  An emphasis was placed on using counselor, doctor, therapy groups, 12-step groups, and problem-specific support groups to expand supports.   There was also an extensive discussion about what constitutes a healthy support versus an unhealthy support.  The patient expressed that he has good supports in place.  He did not talk much, and was called out prior to the end of group in order to be discharged.  Type of Therapy:  Process Group  Participation Level:  Active  Participation Quality:  Attentive and Sharing  Affect:  Blunted  Cognitive:  Appropriate and Oriented  Insight:  Developing/Improving  Engagement in Therapy:  Engaged  Modes of Intervention:  Education,  Support and [employer], LCSW 07/14/2014, 4:00pm

## 2014-07-14 NOTE — Progress Notes (Signed)
[facility] Adult Case Management Discharge Plan :  Will you be returning to the same living situation after discharge: Yes,  as planned At discharge, do you have transportation home?:Yes,  being picked up Do you have the ability to pay for your medications:Yes,  discussed with weekday CSW  Release of information consent forms completed and in the chart;  Patient's signature needed at discharge.  Patient to Follow up at: Follow-up Information   Follow up with [facility] On 07/11/2014. (You are scheduled with [facility] on )    Contact information:   [address] [place], [place]   [address]  [phone]      Patient denies SI/HI:   Yes,  denies    Safety Planning and Suicide Prevention discussed:  Yes,  in groups during week  Sarina Ser 07/14/2014, 12:14 PM

## 2014-07-14 NOTE — [facility] Suicide Risk Assessment (Signed)
Demographic Factors:  Caucasian  Total Time spent with patient: 30 minutes  Psychiatric Specialty Exam: Physical Exam  ROS  Blood pressure 114/65, pulse 73, temperature 97.9 F (36.6 C), temperature source Oral, resp. rate 16, height  (1.702 m), weight 97.07 kg (214 lb).Body mass index is 33.51 kg/(m^2).  General Appearance: Casual  Eye Contact::  Fair  Speech:  Slow  Volume:  Decreased  Mood:  Euthymic  Affect:  Appropriate, Congruent and Restricted  Thought Process:  Coherent and Goal Directed  Orientation:  Full (Time, Place, and Person)  Thought Content:  Negative  Suicidal Thoughts:  No  Homicidal Thoughts:  No  Memory:  Negative  Judgement:  Fair  Insight:  Fair  Psychomotor Activity:  Normal  Concentration:  Fair  Recall:  Fair  Fund of Knowledge:Fair  Language: Fair  Akathisia:  Negative  Handed:  Right  AIMS (if indicated):     Assets:  Communication Skills Desire for Improvement Housing Social Support  Sleep:  Number of Hours: 6.75    Musculoskeletal: Strength & Muscle Tone: within normal limits Gait & Station: normal Patient leans: N/A   Mental Status Per Nursing Assessment::   On Admission:  NA  Current Mental Status by Physician: Pt was interviewed. Chart reviewed. Pt reports mood is better. No withdrawal symptoms. Sleep/appetite good. Pt denies SI/HI/AVH. Tolerating meds well. Pt reports readiness for discharge. Pt looking forward to spending time with his wife.   Loss Factors: Loss of significant relationship  Historical Factors: Prior suicide attempts and Family history of suicide  Risk Reduction Factors:   Sense of responsibility to family, Living with another person, especially a relative and Positive social support  Continued Clinical Symptoms:  Depression:   Anhedonia  Cognitive Features That Contribute To Risk:  Loss of executive function    Suicide Risk:  Mild:  Suicidal ideation of limited frequency, intensity,  duration, and specificity.  There are no identifiable plans, no associated intent, mild dysphoria and related symptoms, good self-control (both objective and subjective assessment), few other risk factors, and identifiable protective factors, including available and accessible social support.   Discharge Diagnoses:   AXIS I:  Major Depression, Recurrent severe AXIS II:  Deferred AXIS III:   Past Medical History  Diagnosis Date  . 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 Ejection fraction < 50%     EF 30% echo November, 2011 (MI)  / EF 40%, echo, months after MI  /  EF 45%, cardiac catheterization, June, 201 to  . CHF (congestive heart failure)     Initially post MI, then improved.  . Depression   . Hepatitis C   . Hypertension   . History of cocaine abuse     in 80's, ex-IVDU  . History of alcohol abuse   . Pulmonary nodule   . Anal fissure   . Dizziness     November, 2013  . Carotid artery disease     Doppler, June, 2014, 1-39% bilateral   AXIS IV:  other psychosocial or environmental problems AXIS V:  61-70 mild symptoms  Plan Of Care/Follow-up recommendations:  Activity:  as tolerated Diet:  per PCP Tests:  per PCP Other:  f/u at [facility] as scheduled. Pt reports sobriety from alcohol/cocaine for 15 years.  Is patient on multiple  antipsychotic therapies at discharge:  No   Has Patient had three or more failed trials of antipsychotic monotherapy by history:  No  Recommended Plan for Multiple Antipsychotic Therapies: NA    Angel Donovan 07/14/2014, 8:13 AM

## 2014-07-14 NOTE — Progress Notes (Signed)
D Pt given DC AVS, it is reviewed with him and he stated he understood and will comply with these f/u instructions as outlined in the AVS. He remains sad, depresseed and flat and he responds very very slowly, just answering " yes" or "no" to the questions posed him by this Clinical research associate. A He did complete his morning  assessment and on it he writes he denies  SI and he rated his depression, hopelessness and anxiety "4/2/8" as well denying SI within the past 24 hrs.  AHe does attend his groups today.Marland Kitchen

## 2014-07-14 NOTE — Progress Notes (Signed)
The patient attended the A. A. Meeting this evening and was appropriate.  

## 2014-07-17 NOTE — Progress Notes (Signed)
Patient Discharge Instructions:  After Visit Summary (AVS):   Faxed to:  07/17/14 Discharge Summary Note:   Faxed to:  07/17/14 Psychiatric Admission Assessment Note:   Faxed to:  07/17/14 Suicide Risk Assessment - Discharge Assessment:   Faxed to:  07/17/14 Faxed/Sent to the Next Level Care provider:  07/17/14 Faxed to [facility] @ [phone]  Jerelene Redden, 07/17/2014, 3:43 PM

## 2014-08-07 NOTE — Discharge Summary (Signed)
Case discussed with NP. Pt interviewed for discharge SRA. Chart reviewed. Agree with above discharge summary.  Nakeeta Sebastiani, MD 

## 2014-09-02 ENCOUNTER — Ambulatory Visit (INDEPENDENT_AMBULATORY_CARE_PROVIDER_SITE_OTHER): Payer: PRIVATE HEALTH INSURANCE | Admitting: Cardiology

## 2014-09-02 ENCOUNTER — Encounter: Payer: Self-pay | Admitting: Cardiology

## 2014-09-02 VITALS — BP 131/88 | HR 67 | Ht 70.0 in | Wt 222.8 lb

## 2014-09-02 DIAGNOSIS — I10 Essential (primary) hypertension: Secondary | ICD-10-CM | POA: Diagnosis not present

## 2014-09-02 DIAGNOSIS — R42 Dizziness and giddiness: Secondary | ICD-10-CM

## 2014-09-02 DIAGNOSIS — I5042 Chronic combined systolic (congestive) and diastolic (congestive) heart failure: Secondary | ICD-10-CM | POA: Diagnosis not present

## 2014-09-02 NOTE — Assessment & Plan Note (Signed)
He has some mild dizziness but no marked problems. No further workup.

## 2014-09-02 NOTE — Patient Instructions (Signed)

## 2014-09-02 NOTE — Assessment & Plan Note (Signed)
Blood pressure is controlled. No change in therapy. 

## 2014-09-02 NOTE — Progress Notes (Signed)
Patient ID: Angel Donovan, male   DOB: [birth year], 48 y.o.   MRN: [id number]    HPI  Patient is seen today to followup coronary disease. He had a hospitalization at behavioral health. He is somewhat improved at this time. His cardiac status is stable. He's not having any significant chest pain or palpitations.  No Known Allergies  Current Outpatient Prescriptions  Medication Sig Dispense Refill  . albuterol (PROAIR HFA) 108 (90 BASE) MCG/ACT inhaler Inhale 1 puff into the lungs every 6 (six) hours as needed.      . ARIPiprazole (ABILIFY) 5 MG tablet Take 1 tablet (5 mg total) by mouth at bedtime.  30 tablet  0  . aspirin 81 MG tablet Take 1 tablet (81 mg total) by mouth at bedtime.  30 tablet    . atorvastatin (LIPITOR) 20 MG tablet Take 1 tablet (20 mg total) by mouth daily.      . benazepril-hydrochlorthiazide (LOTENSIN HCT) 20-25 MG per tablet Take 1 tablet by mouth daily.      . BuPROPion HCl ER, XL, 450 MG TB24 Take 450 mg by mouth daily.  30 tablet  0  . carvedilol (COREG) 25 MG tablet Take 1 tablet (25 mg total) by mouth 2 (two) times daily.      . clopidogrel (PLAVIX) 75 MG tablet Take 1 tablet (75 mg total) by mouth daily.      . diazepam (VALIUM) 5 MG tablet Take 5 mg by mouth 3 (three) times daily.      Marland Kitchen. doxepin (SINEQUAN) 150 MG capsule Take 1 capsule (150 mg total) by mouth at bedtime.  30 capsule  0  . famotidine (PEPCID) 20 MG tablet Take 1 tablet (20 mg total) by mouth 2 (two) times daily.      Marland Kitchen. FLUoxetine (PROZAC) 40 MG capsule Take 1 capsule (40 mg total) by mouth daily.  30 capsule  0  . meloxicam (MOBIC) 15 MG tablet Take 1 tablet (15 mg total) by mouth daily.      . nitroGLYCERIN (NITROSTAT) 0.4 MG SL tablet Place 1 tablet (0.4 mg total) under the tongue every 5 (five) minutes as needed.  25 tablet  3  . Omega-3 Fatty Acids (FISH OIL) 1000 MG CAPS Take 1 capsule (1,000 mg total) by mouth 2 (two) times daily.    0  . tiZANidine (ZANAFLEX) 4 MG tablet Take 1 tablet (4  mg total) by mouth at bedtime.  30 tablet  0   No current facility-administered medications for this visit.    History   Social History  . Marital Status: Married    Spouse Name: N/A    Number of Children: N/A  . Years of Education: N/A   Occupational History  . Not on file.   Social History Main Topics  . Smoking status: Former Smoker -- 1.00 packs/day for 35 years    Types: Cigarettes    Quit date: 10/18/2010  . Smokeless tobacco: Never Used  . Alcohol Use: No     Comment: quit  . Drug Use: No  . Sexual Activity: Yes    Birth Control/ Protection: None   Other Topics Concern  . Not on file   Social History Narrative  . No narrative on file    Family History  Problem Relation Age of Onset  . Coronary artery disease Father   . Cancer Father     Past Medical History  Diagnosis Date  . Coronary artery disease     Inferior  MI November, 2011, bare-metal stent large circumflex, moderate LAD disease  /   catheterization June, 2012 LAD unchanged, moderate in-stent restenosis circumflex, improved LV function, EF 45%, medical therapy  . Ejection fraction < 50%     EF 30% echo November, 2011 (MI)  / EF 40%, echo, months after MI  /  EF 45%, cardiac catheterization, June, 201 to  . CHF (congestive heart failure)     Initially post MI, then improved.  . Depression   . Hepatitis C   . Hypertension   . History of cocaine abuse     in 80's, ex-IVDU  . History of alcohol abuse   . Pulmonary nodule   . Anal fissure   . Dizziness     November, 2013  . Carotid artery disease     Doppler, June, 2014, 1-39% bilateral    Past Surgical History  Procedure Laterality Date  . Cardiac catheterization      with PCI RCA 10/19/2010    Patient Active Problem List   Diagnosis Date Noted  . Alcohol-induced mood disorder 07/09/2014  . Pre-syncope 11/13/2013  . Carotid artery disease   . Chronic combined systolic and diastolic CHF (congestive heart failure) 04/27/2013  .  Dizziness   . Hypotension 05/04/2012  . Coronary artery disease   . Ejection fraction < 50%   . DYSLIPIDEMIA 01/11/2008  . 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 ANAL FISSURE 01/10/2008  . Nonspecific (abnormal) findings on radiological and other examination of body structure 11/28/2007  . ABNORMAL RADIOLOGIAL EXAMINATION 11/28/2007  . PULMONARY NODULE 09/08/2007  . HIP PAIN, RIGHT 07/28/2007  . 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 KERATODERMA, ACQUIRED 04/12/2007  . DYSURIA 04/12/2007  . TOBACCO USE 02/21/2007  . DYSPEPSIA 02/21/2007  . HEPATITIS C 12/19/2006  . ABUSE, ALCOHOL, IN REMISSION 12/19/2006  . ABUSE, COCAINE, IN REMISSION 12/19/2006  . DEPRESSION 12/19/2006  . HYPERTENSION 12/19/2006    ROS   Patient denies fever, chills, headache, sweats, rash, change in vision, change in hearing, chest pain, cough, nausea vomiting, urinary symptoms. All other systems are reviewed and are negative.  PHYSICAL EXAM  Patient is stable today. He has a nervous tapping of his left foot. This is chronic. He is oriented to person time and place. Affect is depressed. Head is atraumatic. Lungs are clear. Respiratory effort is nonlabored. Cardiac exam reveals S1 and S2. Abdomen is soft. There is no peripheral edema.  Filed Vitals:   09/02/14 0918  BP: 131/88  Pulse: 67  Height: 5\' 10"  (1.778 m)  Weight: 222 lb 12 oz (101.039 kg)  SpO2: 99%     ASSESSMENT & PLAN

## 2014-09-02 NOTE — Assessment & Plan Note (Signed)
His volume status is stable. No change in his medicines.

## 2014-09-09 ENCOUNTER — Other Ambulatory Visit: Payer: Self-pay | Admitting: *Deleted

## 2014-09-09 ENCOUNTER — Other Ambulatory Visit: Payer: Self-pay | Admitting: Cardiology

## 2014-09-09 MED ORDER — CLOPIDOGREL BISULFATE 75 MG PO TABS: 75.0000 mg | ORAL_TABLET | Freq: Every day | ORAL | Status: DC

## 2014-11-04 ENCOUNTER — Inpatient Hospital Stay (HOSPITAL_COMMUNITY)
Admission: EM | Admit: 2014-11-04 | Discharge: 2014-11-08 | DRG: 281 | Disposition: A | Discharge status: Other Acute Inpatient Hospital | Payer: PRIVATE HEALTH INSURANCE | Attending: Internal Medicine | Admitting: Internal Medicine

## 2014-11-04 ENCOUNTER — Encounter (HOSPITAL_COMMUNITY): Payer: Self-pay | Admitting: Emergency Medicine

## 2014-11-04 ENCOUNTER — Emergency Department (HOSPITAL_COMMUNITY): Payer: PRIVATE HEALTH INSURANCE

## 2014-11-04 DIAGNOSIS — Z7982 Long term (current) use of aspirin: Secondary | ICD-10-CM | POA: Diagnosis not present

## 2014-11-04 DIAGNOSIS — T405X2A Poisoning by cocaine, intentional self-harm, initial encounter: Principal | ICD-10-CM | POA: Diagnosis present

## 2014-11-04 DIAGNOSIS — Z87891 Personal history of nicotine dependence: Secondary | ICD-10-CM | POA: Diagnosis not present

## 2014-11-04 DIAGNOSIS — F332 Major depressive disorder, recurrent severe without psychotic features: Secondary | ICD-10-CM | POA: Diagnosis present

## 2014-11-04 DIAGNOSIS — B182 Chronic viral hepatitis C: Secondary | ICD-10-CM | POA: Diagnosis present

## 2014-11-04 DIAGNOSIS — M7989 Other specified soft tissue disorders: Secondary | ICD-10-CM | POA: Diagnosis present

## 2014-11-04 DIAGNOSIS — L03113 Cellulitis of right upper limb: Secondary | ICD-10-CM | POA: Diagnosis present

## 2014-11-04 DIAGNOSIS — I82621 Acute embolism and thrombosis of deep veins of right upper extremity: Secondary | ICD-10-CM | POA: Diagnosis present

## 2014-11-04 DIAGNOSIS — F1410 Cocaine abuse, uncomplicated: Secondary | ICD-10-CM

## 2014-11-04 DIAGNOSIS — I214 Non-ST elevation (NSTEMI) myocardial infarction: Secondary | ICD-10-CM | POA: Diagnosis present

## 2014-11-04 DIAGNOSIS — I10 Essential (primary) hypertension: Secondary | ICD-10-CM | POA: Diagnosis present

## 2014-11-04 DIAGNOSIS — I5042 Chronic combined systolic (congestive) and diastolic (congestive) heart failure: Secondary | ICD-10-CM | POA: Diagnosis present

## 2014-11-04 DIAGNOSIS — F1310 Sedative, hypnotic or anxiolytic abuse, uncomplicated: Secondary | ICD-10-CM | POA: Diagnosis present

## 2014-11-04 DIAGNOSIS — Z9861 Coronary angioplasty status: Secondary | ICD-10-CM

## 2014-11-04 DIAGNOSIS — L03114 Cellulitis of left upper limb: Secondary | ICD-10-CM | POA: Diagnosis present

## 2014-11-04 DIAGNOSIS — R609 Edema, unspecified: Secondary | ICD-10-CM

## 2014-11-04 DIAGNOSIS — F1490 Cocaine use, unspecified, uncomplicated: Secondary | ICD-10-CM

## 2014-11-04 DIAGNOSIS — R45851 Suicidal ideations: Secondary | ICD-10-CM | POA: Diagnosis present

## 2014-11-04 DIAGNOSIS — Z79899 Other long term (current) drug therapy: Secondary | ICD-10-CM | POA: Diagnosis not present

## 2014-11-04 DIAGNOSIS — F1210 Cannabis abuse, uncomplicated: Secondary | ICD-10-CM | POA: Diagnosis present

## 2014-11-04 DIAGNOSIS — L0390 Cellulitis, unspecified: Secondary | ICD-10-CM

## 2014-11-04 DIAGNOSIS — I2510 Atherosclerotic heart disease of native coronary artery without angina pectoris: Secondary | ICD-10-CM | POA: Diagnosis present

## 2014-11-04 DIAGNOSIS — T1491 Suicide attempt: Secondary | ICD-10-CM

## 2014-11-04 DIAGNOSIS — F17200 Nicotine dependence, unspecified, uncomplicated: Secondary | ICD-10-CM | POA: Diagnosis present

## 2014-11-04 DIAGNOSIS — T1491XA Suicide attempt, initial encounter: Secondary | ICD-10-CM | POA: Diagnosis present

## 2014-11-04 DIAGNOSIS — Z7902 Long term (current) use of antithrombotics/antiplatelets: Secondary | ICD-10-CM | POA: Diagnosis not present

## 2014-11-04 DIAGNOSIS — F1910 Other psychoactive substance abuse, uncomplicated: Secondary | ICD-10-CM | POA: Diagnosis present

## 2014-11-04 DIAGNOSIS — I82629 Acute embolism and thrombosis of deep veins of unspecified upper extremity: Secondary | ICD-10-CM | POA: Diagnosis present

## 2014-11-04 DIAGNOSIS — E785 Hyperlipidemia, unspecified: Secondary | ICD-10-CM | POA: Diagnosis present

## 2014-11-04 DIAGNOSIS — F32A Depression, unspecified: Secondary | ICD-10-CM | POA: Diagnosis present

## 2014-11-04 DIAGNOSIS — F329 Major depressive disorder, single episode, unspecified: Secondary | ICD-10-CM

## 2014-11-04 LAB — CBC WITH DIFFERENTIAL/PLATELET
BASOS ABS: 0 10*3/uL (ref 0.0–0.1)
Basophils Relative: 1 % (ref 0–1)
EOS PCT: 3 % (ref 0–5)
Eosinophils Absolute: 0.2 10*3/uL (ref 0.0–0.7)
HEMATOCRIT: 36.6 % — AB (ref 39.0–52.0)
Hemoglobin: 11.8 g/dL — ABNORMAL LOW (ref 13.0–17.0)
LYMPHS ABS: 1.4 10*3/uL (ref 0.7–4.0)
LYMPHS PCT: 18 % (ref 12–46)
MCH: 30 pg (ref 26.0–34.0)
MCHC: 32.2 g/dL (ref 30.0–36.0)
MCV: 93.1 fL (ref 78.0–100.0)
MONO ABS: 0.6 10*3/uL (ref 0.1–1.0)
Monocytes Relative: 8 % (ref 3–12)
NEUTROS ABS: 5.5 10*3/uL (ref 1.7–7.7)
Neutrophils Relative %: 70 % (ref 43–77)
Platelets: 258 10*3/uL (ref 150–400)
RBC: 3.93 MIL/uL — AB (ref 4.22–5.81)
RDW: 13.9 % (ref 11.5–15.5)
WBC: 7.8 10*3/uL (ref 4.0–10.5)

## 2014-11-04 LAB — RAPID URINE DRUG SCREEN, HOSP PERFORMED
Amphetamines: NOT DETECTED
Barbiturates: NOT DETECTED
Benzodiazepines: POSITIVE — AB
COCAINE: POSITIVE — AB
OPIATES: NOT DETECTED
TETRAHYDROCANNABINOL: POSITIVE — AB

## 2014-11-04 LAB — COMPREHENSIVE METABOLIC PANEL
ALT: 13 U/L (ref 0–53)
AST: 18 U/L (ref 0–37)
Albumin: 3.2 g/dL — ABNORMAL LOW (ref 3.5–5.2)
Alkaline Phosphatase: 68 U/L (ref 39–117)
Anion gap: 11 (ref 5–15)
BILIRUBIN TOTAL: 0.2 mg/dL — AB (ref 0.3–1.2)
BUN: 7 mg/dL (ref 6–23)
CALCIUM: 8.7 mg/dL (ref 8.4–10.5)
CHLORIDE: 106 meq/L (ref 96–112)
CO2: 24 meq/L (ref 19–32)
Creatinine, Ser: 0.99 mg/dL (ref 0.50–1.35)
GLUCOSE: 114 mg/dL — AB (ref 70–99)
Potassium: 4.1 mEq/L (ref 3.7–5.3)
Sodium: 141 mEq/L (ref 137–147)
Total Protein: 6.9 g/dL (ref 6.0–8.3)

## 2014-11-04 LAB — TROPONIN I
TROPONIN I: 0.46 ng/mL — AB (ref <0.30)
TROPONIN I: 0.53 ng/mL — AB (ref <0.30)
Troponin I: 0.65 ng/mL (ref <0.30)

## 2014-11-04 LAB — SALICYLATE LEVEL

## 2014-11-04 LAB — ACETAMINOPHEN LEVEL

## 2014-11-04 LAB — ETHANOL: Alcohol, Ethyl (B): <11 mg/dL (ref 0–11)

## 2014-11-04 MED ORDER — IBUPROFEN 800 MG PO TABS
800.0000 mg | ORAL_TABLET | Freq: Four times a day (QID) | ORAL | Status: DC | PRN
Start: 2014-11-04 — End: 2014-11-08

## 2014-11-04 MED ORDER — ASPIRIN 81 MG PO CHEW
324.0000 mg | CHEWABLE_TABLET | Freq: Once | ORAL | Status: AC
  Administered 2014-11-04: 324 mg via ORAL
  Filled 2014-11-04: qty 4

## 2014-11-04 MED ORDER — ACETAMINOPHEN 325 MG PO TABS
650.0000 mg | ORAL_TABLET | Freq: Four times a day (QID) | ORAL | Status: DC | PRN
  Administered 2014-11-06 (×2): 650 mg via ORAL
  Filled 2014-11-04 (×2): qty 2

## 2014-11-04 MED ORDER — ALBUTEROL SULFATE HFA 108 (90 BASE) MCG/ACT IN AERS
2.0000 | INHALATION_SPRAY | Freq: Four times a day (QID) | RESPIRATORY_TRACT | Status: DC
  Administered 2014-11-04: 2 via RESPIRATORY_TRACT
  Filled 2014-11-04 (×2): qty 6.7

## 2014-11-04 MED ORDER — CLOPIDOGREL BISULFATE 75 MG PO TABS
75.0000 mg | ORAL_TABLET | Freq: Every day | ORAL | Status: DC
  Administered 2014-11-04 – 2014-11-08 (×5): 75 mg via ORAL
  Filled 2014-11-04 (×5): qty 1

## 2014-11-04 MED ORDER — BENAZEPRIL HCL 10 MG PO TABS
20.0000 mg | ORAL_TABLET | Freq: Every day | ORAL | Status: DC
  Administered 2014-11-05 – 2014-11-08 (×4): 20 mg via ORAL
  Filled 2014-11-04 (×4): qty 2

## 2014-11-04 MED ORDER — SODIUM CHLORIDE 0.45 % IV SOLN
INTRAVENOUS | Status: DC
  Administered 2014-11-04 – 2014-11-06 (×4): via INTRAVENOUS

## 2014-11-04 MED ORDER — BUPROPION HCL ER (XL) 300 MG PO TB24
300.0000 mg | ORAL_TABLET | Freq: Every day | ORAL | Status: DC
  Administered 2014-11-05 – 2014-11-08 (×4): 300 mg via ORAL
  Filled 2014-11-04 (×10): qty 1

## 2014-11-04 MED ORDER — ONDANSETRON HCL 4 MG/2ML IJ SOLN: 4.0000 mg | Freq: Four times a day (QID) | INTRAMUSCULAR | Status: DC | PRN

## 2014-11-04 MED ORDER — NICOTINE 14 MG/24HR TD PT24
14.0000 mg | MEDICATED_PATCH | Freq: Every day | TRANSDERMAL | Status: DC
  Filled 2014-11-04 (×3): qty 1

## 2014-11-04 MED ORDER — TIZANIDINE HCL 4 MG PO TABS
4.0000 mg | ORAL_TABLET | Freq: Every day | ORAL | Status: DC
  Administered 2014-11-04 – 2014-11-07 (×4): 4 mg via ORAL
  Filled 2014-11-04 (×4): qty 1

## 2014-11-04 MED ORDER — ALBUTEROL SULFATE (2.5 MG/3ML) 0.083% IN NEBU: 2.5000 mg | INHALATION_SOLUTION | Freq: Four times a day (QID) | RESPIRATORY_TRACT | Status: DC

## 2014-11-04 MED ORDER — ALBUTEROL SULFATE (2.5 MG/3ML) 0.083% IN NEBU
2.5000 mg | INHALATION_SOLUTION | RESPIRATORY_TRACT | Status: DC | PRN
  Administered 2014-11-04 – 2014-11-05 (×2): 2.5 mg via RESPIRATORY_TRACT
  Filled 2014-11-04 (×2): qty 3

## 2014-11-04 MED ORDER — DOXEPIN HCL 25 MG PO CAPS
150.0000 mg | ORAL_CAPSULE | Freq: Once | ORAL | Status: AC
  Administered 2014-11-04: 150 mg via ORAL
  Filled 2014-11-04: qty 6

## 2014-11-04 MED ORDER — HYDROCHLOROTHIAZIDE 25 MG PO TABS
25.0000 mg | ORAL_TABLET | Freq: Every day | ORAL | Status: DC
  Administered 2014-11-05 – 2014-11-08 (×4): 25 mg via ORAL
  Filled 2014-11-04 (×4): qty 1

## 2014-11-04 MED ORDER — CARVEDILOL 12.5 MG PO TABS
25.0000 mg | ORAL_TABLET | Freq: Two times a day (BID) | ORAL | Status: DC
  Administered 2014-11-04 (×2): 25 mg via ORAL
  Filled 2014-11-04 (×7): qty 2

## 2014-11-04 MED ORDER — FLUOXETINE HCL 20 MG PO CAPS
40.0000 mg | ORAL_CAPSULE | Freq: Every day | ORAL | Status: DC
  Administered 2014-11-04 – 2014-11-08 (×5): 40 mg via ORAL
  Filled 2014-11-04 (×5): qty 2

## 2014-11-04 MED ORDER — LORAZEPAM 1 MG PO TABS: 1.0000 mg | ORAL_TABLET | Freq: Four times a day (QID) | ORAL | Status: AC | PRN

## 2014-11-04 MED ORDER — HEPARIN SODIUM (PORCINE) 5000 UNIT/ML IJ SOLN
5000.0000 [IU] | Freq: Three times a day (TID) | INTRAMUSCULAR | Status: DC
  Administered 2014-11-04 – 2014-11-06 (×6): 5000 [IU] via SUBCUTANEOUS
  Filled 2014-11-04 (×6): qty 1

## 2014-11-04 MED ORDER — DOXYCYCLINE HYCLATE 100 MG PO TABS
100.0000 mg | ORAL_TABLET | Freq: Two times a day (BID) | ORAL | Status: DC
  Administered 2014-11-04 – 2014-11-05 (×2): 100 mg via ORAL
  Filled 2014-11-04 (×2): qty 1

## 2014-11-04 MED ORDER — VITAMIN B-1 100 MG PO TABS
100.0000 mg | ORAL_TABLET | Freq: Every day | ORAL | Status: DC
  Administered 2014-11-04 – 2014-11-08 (×5): 100 mg via ORAL
  Filled 2014-11-04 (×5): qty 1

## 2014-11-04 MED ORDER — ATORVASTATIN CALCIUM 20 MG PO TABS
20.0000 mg | ORAL_TABLET | Freq: Every day | ORAL | Status: DC
  Administered 2014-11-04 – 2014-11-08 (×5): 20 mg via ORAL
  Filled 2014-11-04 (×10): qty 1

## 2014-11-04 MED ORDER — ADULT MULTIVITAMIN W/MINERALS CH
1.0000 | ORAL_TABLET | Freq: Every day | ORAL | Status: DC
  Administered 2014-11-04 – 2014-11-08 (×5): 1 via ORAL
  Filled 2014-11-04 (×5): qty 1

## 2014-11-04 MED ORDER — LORAZEPAM 2 MG/ML IJ SOLN: 1.0000 mg | Freq: Four times a day (QID) | INTRAMUSCULAR | Status: AC | PRN

## 2014-11-04 MED ORDER — THIAMINE HCL 100 MG/ML IJ SOLN
100.0000 mg | Freq: Every day | INTRAMUSCULAR | Status: DC
Start: 2014-11-04 — End: 2014-11-08
  Filled 2014-11-04 (×2): qty 2

## 2014-11-04 MED ORDER — DOXYCYCLINE HYCLATE 100 MG PO TABS
100.0000 mg | ORAL_TABLET | Freq: Once | ORAL | Status: AC
  Administered 2014-11-04: 100 mg via ORAL
  Filled 2014-11-04: qty 1

## 2014-11-04 MED ORDER — SENNA 8.6 MG PO TABS
1.0000 | ORAL_TABLET | Freq: Two times a day (BID) | ORAL | Status: DC
  Administered 2014-11-04 – 2014-11-08 (×8): 8.6 mg via ORAL
  Filled 2014-11-04 (×8): qty 1

## 2014-11-04 MED ORDER — BENAZEPRIL-HYDROCHLOROTHIAZIDE 20-25 MG PO TABS: 1.0000 | ORAL_TABLET | Freq: Every day | ORAL | Status: DC

## 2014-11-04 MED ORDER — TETANUS-DIPHTH-ACELL PERTUSSIS 5-2.5-18.5 LF-MCG/0.5 IM SUSP
0.5000 mL | Freq: Once | INTRAMUSCULAR | Status: AC
  Administered 2014-11-04: 0.5 mL via INTRAMUSCULAR
  Filled 2014-11-04: qty 0.5

## 2014-11-04 MED ORDER — ONDANSETRON HCL 4 MG PO TABS: 4.0000 mg | ORAL_TABLET | Freq: Three times a day (TID) | ORAL | Status: DC | PRN

## 2014-11-04 MED ORDER — OXYCODONE HCL 5 MG PO TABS: 5.0000 mg | ORAL_TABLET | ORAL | Status: DC | PRN

## 2014-11-04 MED ORDER — MELOXICAM 7.5 MG PO TABS
15.0000 mg | ORAL_TABLET | Freq: Every day | ORAL | Status: DC
  Administered 2014-11-05 – 2014-11-08 (×4): 15 mg via ORAL
  Filled 2014-11-04 (×10): qty 2

## 2014-11-04 MED ORDER — ARIPIPRAZOLE 10 MG PO TABS
10.0000 mg | ORAL_TABLET | Freq: Every day | ORAL | Status: DC
  Administered 2014-11-04 – 2014-11-07 (×4): 10 mg via ORAL
  Filled 2014-11-04 (×7): qty 1

## 2014-11-04 MED ORDER — ACETAMINOPHEN 650 MG RE SUPP: 650.0000 mg | Freq: Four times a day (QID) | RECTAL | Status: DC | PRN

## 2014-11-04 MED ORDER — ONDANSETRON HCL 4 MG PO TABS: 4.0000 mg | ORAL_TABLET | Freq: Four times a day (QID) | ORAL | Status: DC | PRN

## 2014-11-04 MED ORDER — POLYETHYLENE GLYCOL 3350 17 G PO PACK: 17.0000 g | PACK | Freq: Every day | ORAL | Status: DC | PRN

## 2014-11-04 MED ORDER — FOLIC ACID 1 MG PO TABS
1.0000 mg | ORAL_TABLET | Freq: Every day | ORAL | Status: DC
  Administered 2014-11-04 – 2014-11-08 (×5): 1 mg via ORAL
  Filled 2014-11-04 (×5): qty 1

## 2014-11-04 MED ORDER — DIAZEPAM 5 MG PO TABS
5.0000 mg | ORAL_TABLET | Freq: Three times a day (TID) | ORAL | Status: DC
  Administered 2014-11-04 (×2): 5 mg via ORAL
  Filled 2014-11-04 (×2): qty 1

## 2014-11-04 NOTE — ED Notes (Signed)
Patient belongings:  1 pair of black tennis shoes, flannel jacket, 3 pair of blue jeans, 1 brown belt, 1 long sleeve button down shirt, 1 black pair of open toe shoes, navy blue fleece pants, 1 pair of green sweat pants, maroon pants, black and grey pants,grey jeans shorts, 4 pairs of white socks, 3 pairs of under pants, 1 blue tote bag,2 polo shirts. Three bags total, locked up in lockers. Valuables locked up with security, yellow sheet placed in patient box labeled 16.

## 2014-11-04 NOTE — ED Notes (Signed)
Dr. Bebe ShaggyWickline notified of elevated troponin. Patient denies chest pain today.

## 2014-11-04 NOTE — BH Assessment (Addendum)
[facility] Assessment Progress Note   Update:  Talked with EDP Wickline who stated pt was not medically cleared and [facility] would be consulted once pt admitted to hospital.  [facility]    Received call to complete tele assessment on the pt.  Called [facility] and scheduled a tele assessment with the pt.  Attempted to called EDP and no answer.  Will call once assessment complete.  Casimer LaniusKristen Emori Mumme, MS, [facility] Licensed Professional Counselor Therapeutic Triage Specialist [facility] Phone: [phone] Fax: [phone]

## 2014-11-04 NOTE — H&P (Signed)
[facility] History and Physical  Angel JewelDennis L Witman [id number] DOB: 1966/02/23 DOA: 11/04/2014  Referring physician: Dr Bebe ShaggyWickline - A{ED PCP: [name],[name], MD   Chief Complaint: suicide attempt  HPI: Angel Donovan is a 48 y.o. male  Suicide attempts. Over the past 2.5 wks pt endorses injecting a minimum of 7mg  and a max of 1gm per day of cocain into his arms and feet in an effort to kill himself. Pt would mix water with cocaine in order to inject. Last injection was on Sautrday. Pt reports periods of falling on floor and possibly convulsing and experiencing CP. Pt reports wife telling him to come in to be seen for care. Injection sites in arms becoming swollen and hard.   Father died 1 year ago and a lot of other emotional stresses.   INcreased home Abilify to 10mg  3 wks ago.   Review of Systems:  Constitutional:  No weight loss, night sweats,  HEENT:  No Difficulty swallowing,Tooth/dental problems,Sore throat,  No sneezing, itching, ear ache, nasal congestion, post nasal drip,  Cardio-vascular:  Per HPI  GI:  No heartburn, indigestion, abdominal pain, nausea, vomiting, diarrhea, change in bowel habits, loss of appetite  Resp:  No shortness of breath with exertion or at rest. No excess mucus, no productive cough, No non-productive cough, No coughing up of blood.No change in color of mucus.No wheezing.No chest wall deformity  Skin:  Per HPI GU:  Decreased urinary output during this time Musculoskeletal:  No joint pain or swelling. No decreased range of motion. No back pain.  Psych:  Per HPI   Past Medical History  Diagnosis Date  . 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 Ejection fraction < 50%     EF 30% echo November, 2011 (MI)  / EF 40%, echo, months  after MI  /  EF 45%, cardiac catheterization, June, 201 to  . CHF (congestive heart failure)     Initially post MI, then improved.  . Depression   . Hepatitis C   . Hypertension   . History of cocaine abuse     in 80's, ex-IVDU  . History of alcohol abuse   . Pulmonary nodule   . Anal fissure   . Dizziness     November, 2013  . Carotid artery disease     Doppler, June, 2014, 1-39% bilateral   Past Surgical History  Procedure Laterality Date  . Cardiac catheterization      with PCI RCA 10/19/2010   Social History:  reports that he quit smoking about 4 years ago. His smoking use included Cigarettes. He has a 35 pack-year smoking history. He has never used smokeless tobacco. He reports that he uses illicit drugs (Cocaine). He reports that he does not drink alcohol.  No Known Allergies  Family History  Problem Relation Age of Onset  . Coronary artery disease Father   . Cancer Father     lymphoma  . Cancer - Colon Father      Prior to Admission medications   Medication Sig Start Date End Date Taking? Authorizing Provider  ARIPiprazole (ABILIFY) 10 MG tablet Take 10 mg by mouth at bedtime.   Yes Historical Provider, MD  aspirin EC 81 MG tablet Take 81 mg by mouth daily.   Yes Historical Provider, MD  atorvastatin (  LIPITOR) 20 MG tablet Take 1 tablet (20 mg total) by mouth daily. 07/14/14  Yes Fransisca Kaufmann, NP  benazepril-hydrochlorthiazide (LOTENSIN HCT) 20-25 MG per tablet Take 1 tablet by mouth daily. 07/14/14  Yes Fransisca Kaufmann, NP  buPROPion (WELLBUTRIN XL) 300 MG 24 hr tablet Take 300 mg by mouth daily.   Yes Historical Provider, MD  carvedilol (COREG) 25 MG tablet Take 1 tablet (25 mg total) by mouth 2 (two) times daily. 07/14/14  Yes Fransisca Kaufmann, NP  clopidogrel (PLAVIX) 75 MG tablet Take 1 tablet (75 mg total) by mouth daily. 09/09/14  Yes Luis Abed, MD  diazepam (VALIUM) 5 MG tablet Take 5 mg by mouth 3 (three) times daily.   Yes Historical Provider, MD  doxepin  (SINEQUAN) 150 MG capsule Take 1 capsule (150 mg total) by mouth at bedtime. 07/14/14  Yes Fransisca Kaufmann, NP  famotidine (PEPCID) 20 MG tablet Take 1 tablet (20 mg total) by mouth 2 (two) times daily. 07/14/14  Yes Fransisca Kaufmann, NP  FLUoxetine (PROZAC) 40 MG capsule Take 1 capsule (40 mg total) by mouth daily. 07/14/14  Yes Fransisca Kaufmann, NP  meloxicam (MOBIC) 15 MG tablet Take 1 tablet (15 mg total) by mouth daily. 07/14/14  Yes Fransisca Kaufmann, NP  Omega-3 Fatty Acids (FISH OIL) 1000 MG CAPS Take 1 capsule (1,000 mg total) by mouth 2 (two) times daily. 07/14/14  Yes Fransisca Kaufmann, NP  tiZANidine (ZANAFLEX) 4 MG tablet Take 1 tablet (4 mg total) by mouth at bedtime. 07/14/14  Yes Fransisca Kaufmann, NP  albuterol (PROAIR HFA) 108 (90 BASE) MCG/ACT inhaler Inhale 1 puff into the lungs every 6 (six) hours as needed. Patient taking differently: Inhale 1 puff into the lungs every 6 (six) hours as needed for wheezing or shortness of breath.  07/14/14   Fransisca Kaufmann, NP  ARIPiprazole (ABILIFY) 5 MG tablet Take 1 tablet (5 mg total) by mouth at bedtime. Patient not taking: Reported on 11/04/2014 07/14/14   Fransisca Kaufmann, NP  aspirin 81 MG tablet Take 1 tablet (81 mg total) by mouth at bedtime. Patient not taking: Reported on 11/04/2014 07/14/14   Fransisca Kaufmann, NP  BuPROPion HCl ER, XL, 450 MG TB24 Take 450 mg by mouth daily. Patient not taking: Reported on 11/04/2014 07/14/14   Fransisca Kaufmann, NP  nitroGLYCERIN (NITROSTAT) 0.4 MG SL tablet Place 1 tablet (0.4 mg total) under the tongue every 5 (five) minutes as needed. 07/14/14   Fransisca Kaufmann, NP   Physical Exam: Filed Vitals:   11/04/14 1612 11/04/14 1630 11/04/14 1632 11/04/14 1633  BP: 126/74 114/63 114/60   Pulse: 95 91 92 92  Temp:  97.9 F (36.6 C) 97.9 F (36.6 C)   TempSrc:  Oral Oral   Resp: 18 22  20   Height:   5\' 10"  (1.778 m)   Weight:   93.441 kg (206 lb)   SpO2: 97% 96% 95% 96%    Wt Readings from Last 3 Encounters:  11/04/14 93.441 kg (206 lb)  09/02/14 101.039  kg (222 lb 12 oz)  07/09/14 97.07 kg (214 lb)    General:  Appears calm and comfortable Eyes:  PERRL, normal lids, irises & conjunctiva ENT:  grossly normal hearing, lips & tongue Neck:  no LAD, masses or thyromegaly Cardiovascular:  RRR, no m/r/g. No LE edema. Telemetry:  SR, no arrhythmias  Respiratory:  CTA bilaterally, no w/r/r. Normal respiratory effort. Abdomen: hypoactive BS, soft, ntnd Skin: track marks throughout the antecubital and proximal forearms bilat and  ankles. several veins firm w/ surounding induration, tenderness and erythema. Musculoskeletal: Swelling of the L and R forearms L>R.  grossly normal tone BUE/BLE Psychiatric: Tearful, Denies HI/SI.  grossly normal mood and affect, speech fluent and appropriate Neurologic:  grossly non-focal.          Labs on Admission:  Basic Metabolic Panel:  Recent Labs Lab 11/04/14 1337  NA 141  K 4.1  CL 106  CO2 24  GLUCOSE 114*  BUN 7  CREATININE 0.99  CALCIUM 8.7   Liver Function Tests:  Recent Labs Lab 11/04/14 1337  AST 18  ALT 13  ALKPHOS 68  BILITOT 0.2*  PROT 6.9  ALBUMIN 3.2*   No results for input(s): LIPASE, AMYLASE in the last 168 hours. No results for input(s): AMMONIA in the last 168 hours. CBC:  Recent Labs Lab 11/04/14 1337  WBC 7.8  NEUTROABS 5.5  HGB 11.8*  HCT 36.6*  MCV 93.1  PLT 258   Cardiac Enzymes:  Recent Labs Lab 11/04/14 1337  TROPONINI 0.46*    BNP (last 3 results) No results for input(s): PROBNP in the last 8760 hours. CBG: No results for input(s): GLUCAP in the last 168 hours.  Radiological Exams on Admission: Dg Chest Portable 1 View  11/04/2014   CLINICAL DATA:  Suicidal ideation  EXAM: PORTABLE CHEST - 1 VIEW  COMPARISON:  05/19/2011  FINDINGS: Lungs are essentially clear. No focal consolidation. No pleural effusion or pneumothorax.  The heart is normal in size.  IMPRESSION: No evidence of acute cardiopulmonary disease.   Electronically Signed   By: Charline BillsSriyesh   Krishnan M.D.   On: 11/04/2014 14:37    EKG: Independently reviewed. NSR, low voltage. No change from previous  Assessment/Plan Principal Problem:   Suicide attempt Active Problems:   Chronic hepatitis C   Depression   Essential hypertension   Chronic combined systolic and diastolic CHF (congestive heart failure)   Non-STEMI (non-ST elevated myocardial infarction)   Cellulitis   Polysubstance abuse   HLD (hyperlipidemia)   Left arm swelling   Suicide attempt: no longer suicidal. Episode spurred by loss pt thinking about the loss of his father 1 year ago and numerous other emotional stressors. Dr. Bebe ShaggyWickline in ED discussed case w/ psych who asked to be reconsulted after [facility] is medically [facility]. No changes to his regimen at the time.  - Admit - Sitter in room - Continue Abilify, Welbutrin, Prozac,  - reconsult psych when medically stable - CSW  Arm swelling: pt clearly w/ cellulitis and concern for underlying DVT and phlegmasia. Pt injecting tap water and cocaine concoction into veins. Vascular structures are enlarged, firm and tender in several areas. Possible abscess of thh L arm. - Bilat UE dopplers and evaluation for abscess.  - continue Doxy - broaden if not improving - BCX (doxy already given in ED)  Elevated Troponin: 0.46 on admission. ED consulted cards who felt pt likely experienced NSTEMI on Saturday (lat dose of Cocaine), and is trending down. Known h/o CAD.  - Cycle troponins - Tele - Cards consult if bumps again  COmbined systolic and diastolic CHF: No sign of exacerbation. Last echo 2013 w/ EF 40-50% and diffuse hypokinesis. Concern for further injury to myocardium over past 2-3 wks  - Repeat Echo - continue home bblocker and ACEi  HTN: stable and normotensive - continue home carvedilol, Lotensin  CAD:  Continue lipitor, plavix, ASA  Polysubstance abuse: UDS + for cocaine, Benzo, and THC.  - CIWA -  HIV  Code Status: FULL DVT Prophylaxis:  Hep Family Communication: none Disposition Plan: pending improvement   Time spent: > 70 min in direct pt care and coordination  MERRELL, DAVID J [facility] [email or web address] Password [email or web address]

## 2014-11-04 NOTE — ED Notes (Signed)
MD at bedside. 

## 2014-11-04 NOTE — ED Notes (Signed)
Pt reports SI over 2-3 weeks. Pt states he has been using street drugs, cocaine in particular, to try to kill himself.

## 2014-11-04 NOTE — ED Provider Notes (Signed)
CSN: [id number]     Arrival date & time 11/04/14  1318 History  This chart was scribed for Joya Gaskins, MD by Ronney Lion, ED Scribe. This patient was seen in room APA16A/APA16A and the patient's care was started at 1:56 PM.    Chief Complaint  Patient presents with  . V70.1   Patient gave verbal permission to utilize photo for medical documentation only The image was not stored on any personal device  Patient is a 48 y.o. male presenting with mental health disorder. The history is provided by the patient. No language interpreter was used.  Mental Health Problem Presenting symptoms: suicidal thoughts   Degree of incapacity (severity):  Severe Duration: 2.5. Timing:  Constant Progression:  Worsening Chronicity:  New Relieved by:  Nothing Worsened by:  Drugs Associated symptoms: chest pain and headaches   Associated symptoms: no abdominal pain     HPI Comments: Angel Donovan is a 48 y.o. male who presents to the Emergency Department for SI for over 2.5 weeks. Patient has been injecting cocaine 7 g/day in bilateral arms and feet in order to kill himself. His last injection was on Saturday (2 days ago). He complains of associated BUE swelling due to injections. He confirms chills, diarrhea, chest pain when using cocaine, and headache. Patient denies ingesting any other substance to self-harm. He reports a history of heart problems. Patient denies fever, vomiting, and abdominal pain.  He denies any hallucinations HE REPORTS HE LAST HAD CP 2 DAYS AGO AFTER HIS LAST COCAINE USE HE DENIES ACTIVE CHEST PAIN AT THIS TIME   PMH - CAD Soc hx - cocaine use  Past Medical History  Diagnosis Date  . 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 Ejection fraction < 50%     EF 30% echo  November, 2011 (MI)  / EF 40%, echo, months after MI  /  EF 45%, cardiac catheterization, June, 201 to  . CHF (congestive heart failure)     Initially post MI, then improved.  . Depression   . Hepatitis C   . Hypertension   . History of cocaine abuse     in 80's, ex-IVDU  . History of alcohol abuse   . Pulmonary nodule   . Anal fissure   . Dizziness     November, 2013  . Carotid artery disease     Doppler, June, 2014, 1-39% bilateral   Past Surgical History  Procedure Laterality Date  . Cardiac catheterization      with PCI RCA 10/19/2010   Family History  Problem Relation Age of Onset  . Coronary artery disease Father   . Cancer Father    History  Substance Use Topics  . Smoking status: Former Smoker -- 1.00 packs/day for 35 years    Types: Cigarettes    Quit date: 10/18/2010  . Smokeless tobacco: Never Used  . Alcohol Use: No     Comment: quit    Review of Systems  Constitutional: Positive for chills. Negative for fever.  Cardiovascular: Positive for chest pain.  Gastrointestinal: Positive for diarrhea. Negative for vomiting and abdominal pain.  Neurological: Positive for headaches.  Psychiatric/Behavioral: Positive for suicidal ideas.  All other systems reviewed and are negative.     Allergies  Review of patient's allergies indicates no known allergies.  Home Medications   Prior to Admission medications   Medication Sig Start Date End Date Taking? Authorizing Provider  meloxicam (MOBIC) 15 MG tablet Take 1 tablet (15 mg total) by mouth daily. 07/14/14  Yes Fransisca KaufmannLaura Davis, NP  albuterol (PROAIR HFA) 108 (90 BASE) MCG/ACT inhaler Inhale 1 puff into the lungs every 6 (six) hours as needed. 07/14/14   Fransisca KaufmannLaura Davis, NP  ARIPiprazole (ABILIFY) 5 MG tablet Take 1 tablet (5 mg total) by mouth at bedtime. 07/14/14   Fransisca KaufmannLaura Davis, NP  aspirin 81 MG tablet Take 1 tablet (81 mg total) by mouth at bedtime. 07/14/14   Fransisca KaufmannLaura Davis, NP  atorvastatin (LIPITOR) 20 MG tablet Take 1  tablet (20 mg total) by mouth daily. 07/14/14   Fransisca KaufmannLaura Davis, NP  benazepril-hydrochlorthiazide (LOTENSIN HCT) 20-25 MG per tablet Take 1 tablet by mouth daily. 07/14/14   Fransisca KaufmannLaura Davis, NP  BuPROPion HCl ER, XL, 450 MG TB24 Take 450 mg by mouth daily. 07/14/14   Fransisca KaufmannLaura Davis, NP  carvedilol (COREG) 25 MG tablet Take 1 tablet (25 mg total) by mouth 2 (two) times daily. 07/14/14   Fransisca KaufmannLaura Davis, NP  clopidogrel (PLAVIX) 75 MG tablet Take 1 tablet (75 mg total) by mouth daily. 09/09/14   Luis AbedJeffrey D Katz, MD  diazepam (VALIUM) 5 MG tablet Take 5 mg by mouth 3 (three) times daily.    Historical Provider, MD  doxepin (SINEQUAN) 150 MG capsule Take 1 capsule (150 mg total) by mouth at bedtime. 07/14/14   Fransisca KaufmannLaura Davis, NP  famotidine (PEPCID) 20 MG tablet Take 1 tablet (20 mg total) by mouth 2 (two) times daily. 07/14/14   Fransisca KaufmannLaura Davis, NP  FLUoxetine (PROZAC) 40 MG capsule Take 1 capsule (40 mg total) by mouth daily. 07/14/14   Fransisca KaufmannLaura Davis, NP  nitroGLYCERIN (NITROSTAT) 0.4 MG SL tablet Place 1 tablet (0.4 mg total) under the tongue every 5 (five) minutes as needed. 07/14/14   Fransisca KaufmannLaura Davis, NP  Omega-3 Fatty Acids (FISH OIL) 1000 MG CAPS Take 1 capsule (1,000 mg total) by mouth 2 (two) times daily. 07/14/14   Fransisca KaufmannLaura Davis, NP  tiZANidine (ZANAFLEX) 4 MG tablet Take 1 tablet (4 mg total) by mouth at bedtime. 07/14/14   Fransisca KaufmannLaura Davis, NP   BP 120/75 mmHg  Pulse 94  Temp(Src) 98.4 F (36.9 C) (Oral)  Resp 18  Ht 5\' 10"  (1.778 m)  Wt 250 lb (113.399 kg)  BMI 35.87 kg/m2  SpO2 98% Physical Exam  Nursing note and vitals reviewed.  CONSTITUTIONAL: Disheveled. No distress noted HEAD: Normocephalic/atraumatic EYES: EOMI/PERRL ENMT: Mucous membranes moist NECK: supple no meningeal signs SPINE/BACK:entire spine nontender CV: S1/S2 noted, no murmurs/rubs/gallops noted LUNGS: Lungs are clear to auscultation bilaterally, no apparent distress ABDOMEN: soft, nontender, no rebound or guarding, bowel sounds noted throughout  abdomen GU:no cva tenderness NEURO: Pt is awake/alert/appropriate, moves all extremitiesx4.  No facial droop.   EXTREMITIES: pulses normal/equal, full ROM, see photo below SKIN: warm, color normal PSYCH: alert and oriented to situation. Flat affect.          ED Course  Procedures  CRITICAL CARE Performed by: Joya GaskinsWICKLINE,Nikolette Reindl W Total critical care time: 33 Critical care time was exclusive of separately billable procedures and treating other patients. Critical care was necessary to treat or prevent imminent or life-threatening deterioration. Critical care was time spent personally by me on the following activities: development of treatment plan with patient and/or surrogate as well as nursing, discussions with consultants, evaluation of patient's response to treatment, examination of  patient, obtaining history from patient or surrogate, ordering and performing treatments and interventions, ordering and review of laboratory studies, ordering and review of radiographic studies, pulse oximetry and re-evaluation of patient's condition. PATIENT WITH NON-STEMI, ELEVATED TROPONIN LIKELY DUE TO RECENT COCAINE USE  Medications  ARIPiprazole (ABILIFY) tablet 10 mg (not administered)  atorvastatin (LIPITOR) tablet 20 mg (not administered)  buPROPion (WELLBUTRIN XL) 24 hr tablet 300 mg (not administered)  carvedilol (COREG) tablet 25 mg (not administered)  clopidogrel (PLAVIX) tablet 75 mg (75 mg Oral Given 11/04/14 1505)  diazepam (VALIUM) tablet 5 mg (not administered)  meloxicam (MOBIC) tablet 15 mg (not administered)  albuterol (PROVENTIL HFA;VENTOLIN HFA) 108 (90 BASE) MCG/ACT inhaler 2 puff (2 puffs Inhalation Given 11/04/14 1504)  ibuprofen (ADVIL,MOTRIN) tablet 800 mg (not administered)  nicotine (NICODERM CQ - dosed in mg/24 hours) patch 14 mg (14 mg Transdermal Not Given 11/04/14 1509)  ondansetron (ZOFRAN) tablet 4 mg (not administered)  doxycycline (VIBRA-TABS) tablet 100 mg (100 mg  Oral Given 11/04/14 1418)  aspirin chewable tablet 324 mg (324 mg Oral Given 11/04/14 1506)  Tdap (BOOSTRIX) injection 0.5 mL (0.5 mLs Intramuscular Given 11/04/14 1506)   DIAGNOSTIC STUDIES: Oxygen Saturation is 98% on room air, normal by my interpretation.    COORDINATION OF CARE: 2:01 PM - Discussed treatment plan with pt at bedside which includes antibiotics and interview with a behavioral health specialist and pt agreed to plan.  I spoke to Dr Purvis Sheffield with cardiology He reviewed EKG and Patient chart Non-stemi likely due to recent Cocaine abuse.  He is not having active CP at this time He recommends medical admit to trend troponins at [facility] Pt will also likely need treatment for cellulitis from IVDA D/w Dr Konrad Dolores, will admit to triad to medically clear patient and then have full psych consult at that time.  He was not seen by psychiatry in the ER   Labs Review Labs Reviewed  CBC WITH DIFFERENTIAL - Abnormal; Notable for the following:    RBC 3.93 (*)    Hemoglobin 11.8 (*)    HCT 36.6 (*)    All other components within normal limits  COMPREHENSIVE METABOLIC PANEL - Abnormal; Notable for the following:    Glucose, Bld 114 (*)    Albumin 3.2 (*)    Total Bilirubin 0.2 (*)    All other components within normal limits  URINE RAPID DRUG SCREEN (HOSP PERFORMED) - Abnormal; Notable for the following:    Cocaine POSITIVE (*)    Benzodiazepines POSITIVE (*)    Tetrahydrocannabinol POSITIVE (*)    All other components within normal limits  TROPONIN I - Abnormal; Notable for the following:    Troponin I 0.46 (*)    All other components within normal limits  SALICYLATE LEVEL - Abnormal; Notable for the following:    Salicylate Lvl <2.0 (*)    All other components within normal limits  ETHANOL  ACETAMINOPHEN LEVEL    Imaging Review Dg Chest Portable 1 View  11/04/2014   CLINICAL DATA:  Suicidal ideation  EXAM: PORTABLE CHEST - 1 VIEW  COMPARISON:  05/19/2011  FINDINGS:  Lungs are essentially clear. No focal consolidation. No pleural effusion or pneumothorax.  The heart is normal in size.  IMPRESSION: No evidence of acute cardiopulmonary disease.   Electronically Signed   By: Charline Bills M.D.   On: 11/04/2014 14:37     EKG Interpretation   Date/Time:  Monday November 04 2014 14:29:17 EST Ventricular Rate:  93 PR Interval:  142 QRS Duration: 94 QT Interval:  350 QTC Calculation: 435 R Axis:   -22 Text Interpretation:  Normal sinus rhythm Low voltage QRS Inferior infarct  , age undetermined Abnormal ECG No significant change since last tracing  Confirmed by Bebe ShaggyWICKLINE  MD, Lanell Carpenter ([id number]) on 11/04/2014 2:37:01 PM      MDM   Final diagnoses:  Cocaine use  Non-STEMI (non-ST elevated myocardial infarction)  Cellulitis of left upper extremity  Cellulitis of right upper extremity     Nursing notes including past medical history and social history reviewed and considered in documentation xrays/imaging reviewed by myself and considered during evaluation Labs/vital reviewed myself and considered during evaluation Previous records reviewed and considered    I personally performed the services described in this documentation, which was scribed in my presence. The recorded information has been reviewed and is accurate.    Joya Gaskinsonald W Luevenia Mcavoy, MD 11/04/14 [phone]

## 2014-11-04 NOTE — ED Notes (Signed)
Spoke with Briant SitesGreg, AC who reports there will be a sitter ready for patient on the floor.

## 2014-11-05 ENCOUNTER — Inpatient Hospital Stay (HOSPITAL_COMMUNITY): Payer: PRIVATE HEALTH INSURANCE

## 2014-11-05 DIAGNOSIS — R072 Precordial pain: Secondary | ICD-10-CM

## 2014-11-05 LAB — CBC
HCT: 35.2 % — ABNORMAL LOW (ref 39.0–52.0)
Hemoglobin: 11.1 g/dL — ABNORMAL LOW (ref 13.0–17.0)
MCH: 29.8 pg (ref 26.0–34.0)
MCHC: 31.5 g/dL (ref 30.0–36.0)
MCV: 94.4 fL (ref 78.0–100.0)
PLATELETS: 246 10*3/uL (ref 150–400)
RBC: 3.73 MIL/uL — AB (ref 4.22–5.81)
RDW: 14.1 % (ref 11.5–15.5)
WBC: 6.2 10*3/uL (ref 4.0–10.5)

## 2014-11-05 LAB — COMPREHENSIVE METABOLIC PANEL
ALBUMIN: 2.6 g/dL — AB (ref 3.5–5.2)
ALT: 11 U/L (ref 0–53)
ANION GAP: 11 (ref 5–15)
AST: 13 U/L (ref 0–37)
Alkaline Phosphatase: 54 U/L (ref 39–117)
BUN: 8 mg/dL (ref 6–23)
CALCIUM: 8.5 mg/dL (ref 8.4–10.5)
CHLORIDE: 108 meq/L (ref 96–112)
CO2: 25 mEq/L (ref 19–32)
CREATININE: 1.12 mg/dL (ref 0.50–1.35)
GFR calc Af Amer: 87 mL/min — ABNORMAL LOW (ref >90)
GFR, EST NON AFRICAN AMERICAN: 75 mL/min — AB (ref >90)
Glucose, Bld: 94 mg/dL (ref 70–99)
Potassium: 3.9 mEq/L (ref 3.7–5.3)
Sodium: 144 mEq/L (ref 137–147)
Total Bilirubin: 0.3 mg/dL (ref 0.3–1.2)
Total Protein: 5.7 g/dL — ABNORMAL LOW (ref 6.0–8.3)

## 2014-11-05 LAB — TROPONIN I

## 2014-11-05 LAB — HIV ANTIBODY (ROUTINE TESTING W REFLEX): HIV 1&2 Ab, 4th Generation: NONREACTIVE

## 2014-11-05 MED ORDER — DOXEPIN HCL 25 MG PO CAPS
150.0000 mg | ORAL_CAPSULE | Freq: Every day | ORAL | Status: DC
  Administered 2014-11-05 – 2014-11-07 (×3): 150 mg via ORAL
  Filled 2014-11-05 (×3): qty 6

## 2014-11-05 MED ORDER — CLINDAMYCIN PHOSPHATE 600 MG/50ML IV SOLN
600.0000 mg | Freq: Three times a day (TID) | INTRAVENOUS | Status: DC
  Administered 2014-11-05 – 2014-11-08 (×9): 600 mg via INTRAVENOUS
  Filled 2014-11-05 (×19): qty 50

## 2014-11-05 MED ORDER — ASPIRIN EC 81 MG PO TBEC
81.0000 mg | DELAYED_RELEASE_TABLET | Freq: Every day | ORAL | Status: DC
  Administered 2014-11-05 – 2014-11-08 (×4): 81 mg via ORAL
  Filled 2014-11-05 (×4): qty 1

## 2014-11-05 MED ORDER — ALBUTEROL SULFATE (2.5 MG/3ML) 0.083% IN NEBU: 2.5000 mg | INHALATION_SOLUTION | Freq: Four times a day (QID) | RESPIRATORY_TRACT | Status: DC | PRN

## 2014-11-05 NOTE — BH Assessment (Addendum)
Tele Assessment Note   Angel Donovan is an 48 y.o. male. Pt presents with C/O Substance Abuse Addiction and SI with intent to use so much Cocaine that he would go into cardiac arrest and die. Pt reports that he presented to [facility] yesterday accompanied by his wife in efforts to seek help for his substance abuse addiction. Pt reports that he had some medical complaints about his arm and [facility] recommended that he present to [facility] to be evaluated. Pt presents tearful and depressed. Pt reports that he has been sober from Cocaine for 15 years and recently relapsed on Cocaine about 3 weeks ago and has been using daily since relapse. Pt reports that his relapse was triggered by the death of his father, who died earlier this year. Pt reports that he feels empty without his father. Pt denies use of any other substances. Pt reports a history of AH when using Cocaine. Pt denies active HI and no AVH reported. Pt currently denies SI, but states multiple times throughout assessment that his intent is to use as much Cocaine as possible in hopes that his heart could no longer take it and he would go into cardiac arrest and die. Pt is unable to reliably contract for safety. Pt reports a decline in hygiene,grooming, sleep, and appetite. Pt appears to be decompensating and is unable to function.  Consulted with Berneice Heinrich [facility] and Claudette Head, NP whom is recommending inpatient psychiatric treatment for safety and stabilization. [facility] have no available beds at this time and pt needs to be referred to other facilities for bed placement.   Axis I: Stimulant Use Disorder, Severe, Major Depressive Disorder Axis II: Deferred Axis III:  Past Medical History  Diagnosis Date  . Coronary artery disease     Inferior MI November, 2011, bare-metal stent large circumflex, moderate LAD disease  /   catheterization June, 2012 LAD unchanged, moderate in-stent restenosis circumflex, improved LV function, EF 45%, medical therapy   . Ejection fraction < 50%     EF 30% echo November, 2011 (MI)  / EF 40%, echo, months after MI  /  EF 45%, cardiac catheterization, June, 201 to  . CHF (congestive heart failure)     Initially post MI, then improved.  . Depression   . Hepatitis C   . Hypertension   . History of cocaine abuse     in 80's, ex-IVDU  . History of alcohol abuse   . Pulmonary nodule   . Anal fissure   . Dizziness     November, 2013  . Carotid artery disease     Doppler, June, 2014, 1-39% bilateral   Axis IV: other psychosocial or environmental problems, problems related to social environment and problems with primary support group Axis V: 21-30 behavior considerably influenced by delusions or hallucinations OR serious impairment in judgment, communication OR inability to function in almost all areas  Past Medical History:  Past Medical History  Diagnosis Date  . 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 Ejection fraction < 50%     EF 30% echo November, 2011 (MI)  / EF 40%, echo, months after MI  /  EF 45%, cardiac catheterization, June, 201 to  . CHF (congestive heart failure)     Initially post MI, then improved.  . Depression   .  Hepatitis C   . Hypertension   . History of cocaine abuse     in 80's, ex-IVDU  . History of alcohol abuse   . Pulmonary nodule   . Anal fissure   . Dizziness     November, 2013  . Carotid artery disease     Doppler, June, 2014, 1-39% bilateral    Past Surgical History  Procedure Laterality Date  . Cardiac catheterization      with PCI RCA 10/19/2010    Family History:  Family History  Problem Relation Age of Onset  . Coronary artery disease Father   . Cancer Father     lymphoma  . Cancer - Colon Father     Social History:  reports that he quit  smoking about 4 years ago. His smoking use included Cigarettes. He has a 35 pack-year smoking history. He has never used smokeless tobacco. He reports that he uses illicit drugs (Cocaine). He reports that he does not drink alcohol.  Additional Social History:  Alcohol / Drug Use History of alcohol / drug use?: Yes Substance #1 Name of Substance 1:  (Cocaine) 1 - Age of First Use:  (18) 1 - Amount (size/oz):  (7-8 oz ) 1 - Frequency:  (daily for the past 3 weeks) 1 - Duration:  (on-going use since recent relapse about 3 weeks ago) 1 - Last Use / Amount:  (11/02/14- 5 grams)  CIWA: CIWA-Ar BP: 103/61 mmHg Pulse Rate: 69 Nausea and Vomiting: no nausea and no vomiting Tactile Disturbances: none Tremor: no tremor Auditory Disturbances: not present Paroxysmal Sweats: no sweat visible Visual Disturbances: not present Anxiety: no anxiety, at ease Headache, Fullness in Head: none present Agitation: normal activity Orientation and Clouding of Sensorium: oriented and can do serial additions CIWA-Ar Total: 0 COWS:    PATIENT STRENGTHS: (choose at least two) Ability for insight Motivation for treatment/growth  Allergies: No Known Allergies  Home Medications:  Medications Prior to Admission  Medication Sig Dispense Refill  . ARIPiprazole (ABILIFY) 10 MG tablet Take 10 mg by mouth at bedtime.    Marland Kitchen. aspirin EC 81 MG tablet Take 81 mg by mouth daily.    Marland Kitchen. atorvastatin (LIPITOR) 20 MG tablet Take 1 tablet (20 mg total) by mouth daily.    . benazepril-hydrochlorthiazide (LOTENSIN HCT) 20-25 MG per tablet Take 1 tablet by mouth daily.    Marland Kitchen. buPROPion (WELLBUTRIN XL) 300 MG 24 hr tablet Take 300 mg by mouth daily.    . carvedilol (COREG) 25 MG tablet Take 1 tablet (25 mg total) by mouth 2 (two) times daily.    . clopidogrel (PLAVIX) 75 MG tablet Take 1 tablet (75 mg total) by mouth daily. 30 tablet 6  . diazepam (VALIUM) 5 MG tablet Take 5 mg by mouth 3 (three) times daily.    Marland Kitchen. doxepin  (SINEQUAN) 150 MG capsule Take 1 capsule (150 mg total) by mouth at bedtime. 30 capsule 0  . famotidine (PEPCID) 20 MG tablet Take 1 tablet (20 mg total) by mouth 2 (two) times daily.    Marland Kitchen. FLUoxetine (PROZAC) 40 MG capsule Take 1 capsule (40 mg total) by mouth daily. 30 capsule 0  . meloxicam (MOBIC) 15 MG tablet Take 1 tablet (15 mg total) by mouth daily.    . Omega-3 Fatty Acids (FISH OIL) 1000 MG CAPS Take 1 capsule (1,000 mg total) by mouth 2 (two) times daily.  0  . tiZANidine (ZANAFLEX) 4 MG tablet Take 1 tablet (4 mg total) by  mouth at bedtime. 30 tablet 0  . albuterol (PROAIR HFA) 108 (90 BASE) MCG/ACT inhaler Inhale 1 puff into the lungs every 6 (six) hours as needed. (Patient taking differently: Inhale 1 puff into the lungs every 6 (six) hours as needed for wheezing or shortness of breath. )    . ARIPiprazole (ABILIFY) 5 MG tablet Take 1 tablet (5 mg total) by mouth at bedtime. (Patient not taking: Reported on 11/04/2014) 30 tablet 0  . aspirin 81 MG tablet Take 1 tablet (81 mg total) by mouth at bedtime. (Patient not taking: Reported on 11/04/2014) 30 tablet   . BuPROPion HCl ER, XL, 450 MG TB24 Take 450 mg by mouth daily. (Patient not taking: Reported on 11/04/2014) 30 tablet 0  . nitroGLYCERIN (NITROSTAT) 0.4 MG SL tablet Place 1 tablet (0.4 mg total) under the tongue every 5 (five) minutes as needed. 25 tablet 3    OB/GYN Status:  No LMP for male patient.  General Assessment Data Location of Assessment: [facility] Is this a Tele or Face-to-Face Assessment?: Tele Assessment Is this an Initial Assessment or a Re-assessment for this encounter?: Initial Assessment Living Arrangements: Spouse/significant other Can pt return to current living arrangement?: Yes Admission Status: Voluntary Is patient capable of signing voluntary admission?: Yes [facility] Referral Source: MD     [facility] Living Arrangements: Spouse/significant other Name of  Psychiatrist: [facility] Name of Therapist: [facility]     Risk to self with the past 6 months Suicidal Ideation: No Suicidal Intent: No Is patient at risk for suicide?: No Suicidal Plan?: No Access to Means: No What has been your use of drugs/alcohol within the last 12 months?: Cocaine Previous Attempts/Gestures: Yes How many times?:  (2-3x- cut wrist before) Other Self Harm Risks:  none reported Triggers for Past Attempts: Other personal contacts Intentional Self Injurious Behavior: None Family Suicide History: Yes (Uncle shot himself-suicide) Recent stressful life event(s): Loss (Comment) (father died on 01/14/14, states his life his empty now) Persecutory voices/beliefs?: No Depression: Yes Depression Symptoms: Despondent, Tearfulness, Loss of interest in usual pleasures, Feeling worthless/self pity, Feeling angry/irritable Substance abuse history and/or treatment for substance abuse?: Yes Suicide prevention information given to non-admitted patients: Not applicable  Risk to Others within the past 6 months Homicidal Ideation: No Thoughts of Harm to Others: No Current Homicidal Intent: No Current Homicidal Plan: No Access to Homicidal Means: No Identified Victim: na History of harm to others?: No Assessment of Violence: None Noted Violent Behavior Description: Depressed, Tearful and cooperative during TTS assessment Does patient have access to weapons?: No Criminal Charges Pending?: No Does patient have a court date: No  Psychosis Hallucinations: None noted Delusions: None noted  Mental Status Report Appear/Hygiene: In scrubs Eye Contact: Fair Motor Activity: Freedom of movement Speech: Logical/coherent Level of Consciousness: Alert Mood: Depressed Affect: Appropriate to circumstance, Depressed Anxiety Level: Minimal Thought Processes: Coherent, Relevant Judgement: Impaired Orientation: Person, Place, Time, Situation Obsessive Compulsive Thoughts/Behaviors:  None  Cognitive Functioning Concentration: Normal Memory: Recent Intact, Remote Intact IQ: Average Insight: Fair Impulse Control: Poor Appetite: Poor Weight Loss: 0 Weight Gain: 0 Sleep: Decreased Total Hours of Sleep:  (varies-maybe 2 hours at a time) Vegetative Symptoms: Staying in bed, Decreased grooming  ADLScreening [facility]) Patient's cognitive ability adequate to safely complete daily activities?: Yes Patient able to express need for assistance with ADLs?: Yes Independently performs ADLs?: Yes (appropriate for developmental age)  Prior Inpatient Therapy Prior Inpatient Therapy: Yes Prior Therapy Dates:  unknown Prior Therapy Facilty/Provider(s): [facility] 06/[facility]-SI) Reason for Treatment: Substance Abuse  Prior Outpatient Therapy Prior Outpatient Therapy: Yes Prior Therapy Dates: Current Provider Prior Therapy Facilty/Provider(s): [facility] Reason for Treatment: Psychiatry and OPT  ADL Screening (condition at time of admission) Patient's cognitive ability adequate to safely complete daily activities?: Yes Is the patient deaf or have difficulty hearing?: No Does the patient have difficulty seeing, even when wearing glasses/contacts?: No Does the patient have difficulty concentrating, remembering, or making decisions?: No Patient able to express need for assistance with ADLs?: Yes Does the patient have difficulty dressing or bathing?: No Independently performs ADLs?: Yes (appropriate for developmental age) Does the patient have difficulty walking or climbing stairs?: No Weakness of Legs: None Weakness of Arms/Hands: None  Home Assistive Devices/Equipment Home Assistive Devices/Equipment: None  Therapy Consults (therapy consults require a physician order) PT Evaluation Needed: No OT Evalulation Needed: No SLP Evaluation Needed: No Abuse/Neglect Assessment (Assessment to be complete while patient is  alone) Physical Abuse: Denies Verbal Abuse: Denies Sexual Abuse: Denies Exploitation of patient/patient's resources: Denies Self-Neglect: Yes, present (Comment) (decline in self care since recent relapse on Cocaine about 3 weeks ago) Values / Beliefs Cultural Requests During Hospitalization: None Spiritual Requests During Hospitalization: None Consults Spiritual Care Consult Needed: No Social Work Consult Needed: No Merchant navy officer (For Healthcare) Does patient have an advance directive?: No Would patient like information on creating an advanced directive?: No - patient declined information Nutrition Screen- [facility] Adult/[facility] Patient's home diet: Regular Have you recently lost weight without trying?: No Have you been eating poorly because of a decreased appetite?: No Malnutrition Screening Tool Score: 0  Additional Information 1:1 In Past 12 Months?: No CIRT Risk: No Elopement Risk: No Does patient have medical clearance?: Yes     Disposition:  Disposition Initial Assessment Completed for this Encounter: Yes Disposition of Patient: Other dispositions Other disposition(s):  (pending)  Cresencia Asmus, Len Blalock, MS, LCASA Assessment Counselor  11/05/2014 11:37 AM

## 2014-11-05 NOTE — Care Management Note (Addendum)
    Page 1 of 1   11/07/2014     4:24:10 PM CARE MANAGEMENT NOTE 11/07/2014  Patient:  Darrell JewelHUNDLEY,Cannan L   Account Number:  [id number]  Date Initiated:  11/05/2014  Documentation initiated by:  Kathyrn SheriffHILDRESS,JESSICA  Subjective/Objective Assessment:   Pt from home, admitted after attempting suicide from drug overdose. Telepscyh referral pending medical clearance, anticipate discharge to inpt behavioral hospital.  No CM needs identified.     Action/Plan:   Anticipated DC Date:  11/07/2014   Anticipated DC Plan:  PSYCHIATRIC HOSPITAL  In-house referral  Clinical Social Worker      DC Planning Services  CM consult      Choice offered to / List presented to:             Status of service:  Completed, signed off Medicare Important Message given?  YES (If response is "NO", the following Medicare IM given date fields will be blank) Date Medicare IM given:  11/07/2014 Medicare IM given by:  Kathyrn SheriffHILDRESS,JESSICA Date Additional Medicare IM given:   Additional Medicare IM given by:    Discharge Disposition:  PSYCHIATRIC HOSPITAL  Per UR Regulation:  Reviewed for med. necessity/level of care/duration of stay  If discussed at Long Length of Stay Meetings, dates discussed:    Comments:  11/07/2014 1600 Kathyrn SheriffJessica Childress, RN, MSN, PCCN [facility]. No CM needs at this time.  11/05/2014 0800 Kathyrn SheriffJessica Childress, RN, MSN, [facility]

## 2014-11-05 NOTE — Clinical Social Work Note (Signed)
CSW received consult for intentional overdose. Awaiting TTS consult. CSW will follow up.  Derenda FennelKara Cecilia Nishikawa, KentuckyLCSW [phone]

## 2014-11-05 NOTE — Progress Notes (Signed)
  Echocardiogram 2D Echocardiogram has been performed.  Angel Donovan 11/05/2014, 2:42 PM

## 2014-11-05 NOTE — BH Assessment (Signed)
Received a telephone call from Chales AbrahamsMary Ann pt's nurse who reports that patient is now medically cleared per her consult with Dr. Iver Nestleungle. Pt's nurse is requesting that patient be evaluated by TTS at this time and will set up tele-psych cart for assessment to begin.  This Clinical research associatewriter paged Dr.Dungle at [phone] to obtain clinicals prior to assessing patient. Awaiting return call from Dr.Dungle.  Glorious PeachNajah Lisvet Rasheed, MS, LCASA Assessment Counselor

## 2014-11-05 NOTE — Care Management Utilization Note (Signed)
UR complete 

## 2014-11-05 NOTE — Progress Notes (Addendum)
[facility] PROGRESS NOTE  Angel Donovan GNF:[id number] DOB: [birth year] DOA: 11/04/2014 PCP: Toma Deiters, MD  Brief narrative 48 year old male with history of major depression, alcohol induced mood disorder, coronary artery disease with PCI 2011, CHF, hepatitis C, hypertension, polysubstance abuse including alcohol and cocaine use presented to the ED with   suicidal ideation for over 20 weeks. He was injecting cocaine daily in b/l arms and feet. He reported bilateral upper extremity swelling from the injection associated with chills, headache and diarrhea. He also reported chest pain about 1 week back while using cocaine.    Assessment/Plan: Suicidal ideations/ attempt Patient reports having a lot of personal stress at present. Denies  being suicidal at this time. Continue Software engineer. Resume home antidepressants. Tele psych consult. will need transfer to [facility] once medically stable   Cellulitis  of bilateral upper extremity Patient reports injecting tap Water  and cocaine  into the veins. Antecubital veins are enlarged and indurated. Switch abx to empiric clindamycin BUE [place] to r/o abscess or DVT -follow blood cx   Elevated troponin  possibly related to chest pain with cocaine use. Patient reported chest pain 2 days prior to admission to teh ED physician. To me he says it was almost a week ago EKG unremarkable. Troponin has normalized now. Will check 2-D echo.  resume aspirin. Will hold carvedilol for now.    coronary artery disease with history of CHF Patient currently euvolemic. Continue aspirin , Plavix and statin. Continue ACE inhibitor. Holding BB. Check 2d echo  Polysubstance abuse Ongoing cocaine use. Denies alcohol use for past several weeks. Monitor on CIWA. Social work consulted  DVT prophylaxis: Subcutaneous heparin   Diet; heart healthy   Code Status: Full code Communication: none at bedside Disposition Plan: inpt psych once medically cleared ,  possibly in next 24-48 hrs   Consultants:  psych  Procedures:  none  Antibiotics:  IV vanco  HPI/Subjective: Patient seen and examined. Denies any suicidal ideation at the time. Admission H&P reviewed   Objective: Filed Vitals:   11/05/14 0523  BP: 103/61  Pulse: 69  Temp: 97.7 F (36.5 C)  Resp: 20    Intake/Output Summary (Last 24 hours) at 11/05/14 0829 Last data filed at 11/05/14 0710  Gross per 24 hour  Intake      0 ml  Output    825 ml  Net   -825 ml   Filed Weights   11/04/14 1322 11/04/14 1632  Weight: 113.399 kg (250 lb) 93.441 kg (206 lb)            Gen: Middle-aged edition male in no acute distress  Cardiovascular: normal S1 and S2, no murmurs  Chest: Clear to auscultation bilaterally, no added sounds  Abdomen: Soft, nondistended, nontender, bowel sounds present  Extremities: Warm, no edema, cellulitis over b/l forearm with needle marks and swelling with induration , tenderness to palpation, no fluctuation or discharge. Needle marks over b/l legs  CNS: Alert and oriented Data Reviewed: Basic Metabolic Panel:  Recent Labs Lab 11/04/14 1337 11/05/14 0400  NA 141 144  K 4.1 3.9  CL 106 108  CO2 24 25  GLUCOSE 114* 94  BUN 7 8  CREATININE 0.99 1.12  CALCIUM 8.7 8.5   Liver Function Tests:  Recent Labs Lab 11/04/14 1337 11/05/14 0400  AST 18 13  ALT 13 11  ALKPHOS 68 54  BILITOT 0.2* 0.3  PROT 6.9 5.7*  ALBUMIN 3.2* 2.6*   No results for input(s): LIPASE, AMYLASE  in the last 168 hours. No results for input(s): AMMONIA in the last 168 hours. CBC:  Recent Labs Lab 11/04/14 1337 11/05/14 0400  WBC 7.8 6.2  NEUTROABS 5.5  --   HGB 11.8* 11.1*  HCT 36.6* 35.2*  MCV 93.1 94.4  PLT 258 246   Cardiac Enzymes:  Recent Labs Lab 11/04/14 1337 11/04/14 1740 11/04/14 2241 11/05/14 0400  TROPONINI 0.46* 0.65* 0.53* <0.30   BNP (last 3 results) No results for input(s): PROBNP in the last 8760 hours. CBG: No results  for input(s): GLUCAP in the last 168 hours.  No results found for this or any previous visit (from the past 240 hour(s)).   Studies: Dg Chest Portable 1 View  11/04/2014   CLINICAL DATA:  Suicidal ideation  EXAM: PORTABLE CHEST - 1 VIEW  COMPARISON:  05/19/2011  FINDINGS: Lungs are essentially clear. No focal consolidation. No pleural effusion or pneumothorax.  The heart is normal in size.  IMPRESSION: No evidence of acute cardiopulmonary disease.   Electronically Signed   By: Charline Bills M.D.   On: 11/04/2014 14:37    Scheduled Meds: . ARIPiprazole  10 mg Oral QHS  . aspirin EC  81 mg Oral Daily  . atorvastatin  20 mg Oral Daily  . benazepril  20 mg Oral Daily   And  . hydrochlorothiazide  25 mg Oral Daily  . buPROPion  300 mg Oral Daily  . clopidogrel  75 mg Oral Daily  . diazepam  5 mg Oral TID  . doxepin  150 mg Oral QHS  . doxycycline  100 mg Oral Q12H  . FLUoxetine  40 mg Oral Daily  . folic acid  1 mg Oral Daily  . heparin  5,000 Units Subcutaneous 3 times per day  . meloxicam  15 mg Oral Daily  . multivitamin with minerals  1 tablet Oral Daily  . nicotine  14 mg Transdermal Daily  . senna  1 tablet Oral BID  . thiamine  100 mg Oral Daily   Or  . thiamine  100 mg Intravenous Daily  . tiZANidine  4 mg Oral QHS   Continuous Infusions: . sodium chloride 75 mL/hr at 11/05/14 0543      Time spent: 25 minutes    Eddie North  [facility] Pager [phone] If 7PM-7AM, please contact night-coverage at [email or web address], password [facility] 11/05/2014, 8:29 AM  LOS: 1 day

## 2014-11-06 DIAGNOSIS — I82621 Acute embolism and thrombosis of deep veins of right upper extremity: Secondary | ICD-10-CM

## 2014-11-06 DIAGNOSIS — I82629 Acute embolism and thrombosis of deep veins of unspecified upper extremity: Secondary | ICD-10-CM | POA: Diagnosis present

## 2014-11-06 MED ORDER — RIVAROXABAN 15 MG PO TABS
15.0000 mg | ORAL_TABLET | Freq: Two times a day (BID) | ORAL | Status: DC
  Administered 2014-11-06 – 2014-11-08 (×5): 15 mg via ORAL
  Filled 2014-11-06 (×5): qty 1

## 2014-11-06 MED ORDER — RIVAROXABAN 20 MG PO TABS: 20.0000 mg | ORAL_TABLET | Freq: Every day | ORAL | Status: DC

## 2014-11-06 NOTE — Clinical Documentation Improvement (Signed)
Possible Clinical Conditions? _______NSTEMI secondary cocaine  _______Other Condition__________________ _______Cannot Clinically Determine   Supporting Information:  Per ED 11/04/14  Note =  Elevated Troponin: 0.46 on admission. ED consulted cards who felt pt likely experienced NSTEMI on Saturday (lat dose of Cocaine), and is trending down. Known h/o CAD. - Cycle troponins  - Tele  - Cards consult if bumps again       Thank You, Shelda Palarlene H Liani Caris ,RN Clinical Documentation Specialist:  [phone]  [facility] Information Management

## 2014-11-06 NOTE — Discharge Instructions (Addendum)
Information on my medicine - XARELTO (rivaroxaban)  This medication education was reviewed with me or my healthcare representative as part of my discharge preparation.  The pharmacist that spoke with me during my hospital stay was:  Wayland DenisHall, Scott A, [facility]  WHY WAS XARELTO PRESCRIBED FOR YOU? Xarelto was prescribed to treat blood clots that may have been found in the veins of your legs (deep vein thrombosis) or in your lungs (pulmonary embolism) and to reduce the risk of them occurring again.  What do you need to know about Xarelto? The starting dose is one 15 mg tablet taken TWICE daily with food for the FIRST 21 DAYS then on (enter date)  11/27/14  the dose is changed to one 20 mg tablet taken ONCE A DAY with your evening meal.  DO NOT stop taking Xarelto without talking to the health care provider who prescribed the medication.  Refill your prescription for 20 mg tablets before you run out.  After discharge, you should have regular check-up appointments with your healthcare provider that is prescribing your Xarelto.  In the future your dose may need to be changed if your kidney function changes by a significant amount.  What do you do if you miss a dose? If you are taking Xarelto TWICE DAILY and you miss a dose, take it as soon as you remember. You may take two 15 mg tablets (total 30 mg) at the same time then resume your regularly scheduled 15 mg twice daily the next day.  If you are taking Xarelto ONCE DAILY and you miss a dose, take it as soon as you remember on the same day then continue your regularly scheduled once daily regimen the next day. Do not take two doses of Xarelto at the same time.   Important Safety Information Xarelto is a blood thinner medicine that can cause bleeding. You should call your healthcare provider right away if you experience any of the following: ? Bleeding from an injury or your nose that does not stop. ? Unusual colored urine (red or dark brown) or  unusual colored stools (red or black). ? Unusual bruising for unknown reasons. ? A serious fall or if you hit your head (even if there is no bleeding).  Some medicines may interact with Xarelto and might increase your risk of bleeding while on Xarelto. To help avoid this, consult your healthcare provider or pharmacist prior to using any new prescription or non-prescription medications, including herbals, vitamins, non-steroidal anti-inflammatory drugs (NSAIDs) and supplements.  This website has more information on Xarelto: [email or web address].  Cellulitis Cellulitis is an infection of the skin and the tissue under the skin. The infected area is usually red and tender. This happens most often in the arms and lower legs. HOME CARE   Take your antibiotic medicine as told. Finish the medicine even if you start to feel better.  Keep the infected arm or leg raised (elevated).  Put a warm cloth on the area up to 4 times per day.  Only take medicines as told by your doctor.  Keep all doctor visits as told. GET HELP IF:  You see red streaks on the skin coming from the infected area.  Your red area gets bigger or turns a dark color.  Your bone or joint under the infected area is painful after the skin heals.  Your infection comes back in the same area or different area.  You have a puffy (swollen) bump in the infected area.  You have new symptoms.  You have a fever. GET HELP RIGHT AWAY IF:   You feel very sleepy.  You throw up (vomit) or have watery poop (diarrhea).  You feel sick and have muscle aches and pains. MAKE SURE YOU:   Understand these instructions.  Will watch your condition.  Will get help right away if you are not doing well or get worse. Document Released: 04/26/2008 Document Revised: 03/25/2014 Document Reviewed: 01/24/2012 [facility] Patient Information 2015 [employer] [place]. This information is not intended to replace advice given to you by your health care  provider. Make sure you discuss any questions you have with your health care provider.

## 2014-11-06 NOTE — BH Assessment (Addendum)
Spoke with pt's ED nurse Marcelino DusterMichelle who reports that pt's Ultrasound indicated superficial blod clots in his arm and will be started on medications this morning. Pt is not medically cleared at this point. Per Dr.Dhungel's most recent disposition note pt should be medically cleared possibly within the next 24-48 hours. Per nurse [facility] staff will follow-up with TSS once pt is medically cleared.  Angel PeachNajah Lachrista Heslin, MS, LCASA Assessment Counselor

## 2014-11-06 NOTE — Clinical Social Work Note (Signed)
Per MD, anticipate pt will be medically stable tomorrow. Notified [name] with TTS who states recommendation is for inpatient and pt will need to be assessed again tomorrow. [facility].   Derenda FennelKara Daelan Gatt, [place] [phone]

## 2014-11-06 NOTE — Progress Notes (Signed)
Patient's wallet was retrieved from the security office safe and given to his wife to take home.  All items were accounted for.

## 2014-11-06 NOTE — Progress Notes (Signed)
ANTICOAGULATION CONSULT NOTE - Initial Consult  Pharmacy Consult for Xarelto Indication: DVT  No Known Allergies  Patient Measurements: Height: 5\' 10"  (177.8 cm) Weight: 206 lb (93.441 kg) IBW/kg (Calculated) : 73  Vital Signs: Temp: 97.4 F (36.3 C) (12/16 0608) Temp Source: Oral (12/16 0608) BP: 96/58 mmHg (12/16 0608) Pulse Rate: 73 (12/16 0608)  Labs:  Recent Labs  11/04/14 1337 11/04/14 1740 11/04/14 2241 11/05/14 0400  HGB 11.8*  --   --  11.1*  HCT 36.6*  --   --  35.2*  PLT 258  --   --  246  CREATININE 0.99  --   --  1.12  TROPONINI 0.46* 0.65* 0.53* <0.30   Estimated Creatinine Clearance: 90.6 mL/min (by C-G formula based on Cr of 1.12).  Medical History: Past Medical History  Diagnosis Date  . 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 Ejection fraction < 50%     EF 30% echo November, 2011 (MI)  / EF 40%, echo, months after MI  /  EF 45%, cardiac catheterization, June, 201 to  . CHF (congestive heart failure)     Initially post MI, then improved.  . Depression   . Hepatitis C   . Hypertension   . History of cocaine abuse     in 80's, ex-IVDU  . History of alcohol abuse   . Pulmonary nodule   . Anal fissure   . Dizziness     November, 2013  . Carotid artery disease     Doppler, June, 2014, 1-39% bilateral   Medications:  Scheduled:  . ARIPiprazole  10 mg Oral QHS  . aspirin EC  81 mg Oral Daily  . atorvastatin  20 mg Oral Daily  . benazepril  20 mg Oral Daily   And  . hydrochlorothiazide  25 mg Oral Daily  . buPROPion  300 mg Oral Daily  . clindamycin (CLEOCIN) IV  600 mg Intravenous Q8H  . clopidogrel  75 mg Oral Daily  . doxepin  150 mg Oral QHS  . FLUoxetine  40 mg Oral Daily  . folic acid  1 mg Oral Daily  . meloxicam  15 mg Oral  Daily  . multivitamin with minerals  1 tablet Oral Daily  . nicotine  14 mg Transdermal Daily  . Rivaroxaban  15 mg Oral BID WC  . [START ON 11/28/2014] rivaroxaban  20 mg Oral Q supper  . senna  1 tablet Oral BID  . thiamine  100 mg Oral Daily   Or  . thiamine  100 mg Intravenous Daily  . tiZANidine  4 mg Oral QHS    Assessment: 48yo male with right brachial DVT.  Asked to initiate Xarelto treatment dose.  Needs anticoagulation for 3 months per MD.  Pt with  history of major depression, alcohol induced mood disorder, coronary artery disease with PCI 2011, CHF, hepatitis C, hypertension, polysubstance abuse including alcohol and cocaine use presented to the ED with suicidal ideation for over 20 weeks. He was injecting cocaine daily in b/l arms and feet. He reported bilateral upper extremity swelling from the injection associated with chills, headache and diarrhea. He also reported chest pain about 1 week back while using cocaine.  Goal of Therapy:  Full dose anticoagulation with Xarelto for DVT treatment Monitor platelets by anticoagulation  protocol: Yes   Plan:   Xarelto 15mg  PO BID x 21 days then  Xarelto 20mg  po daily thereafter  Plan for 3 months Rx per MD  Provide pt education for Xarelto with handout  Monitor CBC and for s/sx of bleeding  Valrie HartHall, Verley Pariseau A 11/06/2014,9:06 AM

## 2014-11-06 NOTE — Progress Notes (Signed)
[facility] PROGRESS NOTE  Angel Donovan GNF:[id number] DOB: 1966/11/01 DOA: 11/04/2014 PCP: [name],[name], MD  Brief narrative 48 year old male with history of major depression, alcohol induced mood disorder, coronary artery disease with PCI 2011, CHF, hepatitis C, hypertension, polysubstance abuse including alcohol and cocaine use presented to the ED with suicidal ideation for over 20 weeks. He was injecting cocaine daily in b/l arms and feet. He reported bilateral upper extremity swelling from the injection associated with chills, headache and diarrhea. He also reported chest pain about 1 week back while using cocaine.   Assessment/Plan: Suicidal ideations/ attempt Patient reports having a lot of personal stress. Still having  suicidal thoughts occasionally. . Continue bedside sitter. Resume home antidepressants. Tele psych consulted. [facility] medically [facility]. Possibly tomorrow.   Cellulitis of bilateral upper extremity Patient reports injecting tap Water and cocaine into the veins. Antecubital veins are enlarged and indurated. Continue  empiric clindamycin BUE US negative for  abscess but shows rt brachial non occlusive  DVT.  - blood cx negative  Right brachial DVT Non occlusive DVT seen on US. Needs anticoagulation for 3 months. Start on xarelto.   NSTEMI in the setting of cocaine use possibly related to ACS with cocaine use. Patient reported chest pain 2 days prior to admission to the  ED physician. To me he says it was almost a week ago EKG unremarkable. Troponin has normalized now. 2 d echo with EF of 45% and Mild hypokinesis of the mid inferior and basal-mid inferolateral myocardium. . resume aspirin. Will hold carvedilol for now.  Patient to follow with his cardiologist Dr [name] as outpt.  coronary artery disease with systolic CHF Patient currently euvolemic. Continue aspirin , Plavix and statin. Continue ACE inhibitor. Holding  BB. 2d echo with EF of 45-50% with hypokinesis. Improved EF from prior echo. Euvolemic.   Polysubstance abuse Ongoing cocaine use. Denies alcohol use for past several weeks. Monitor on CIWA. Social work following  DVT prophylaxis: Subcutaneous heparin, xarelto   Diet; heart healthy   Code Status: Full code Communication: none at bedside Disposition Plan: inpt psych once medically cleared , possibly in next 24-48 hrs   Consultants:  psych  Procedures:  none  Antibiotics:  IV vanco  HPI/Subjective: Patient seen and examined. Reports pain over lt UE. Still has occasional suicidal thoughts  Objective: Filed Vitals:   11/06/14 0608  BP: 96/58  Pulse: 73  Temp: 97.4 F (36.3 C)  Resp: 18    Intake/Output Summary (Last 24 hours) at 11/06/14 0842 Last data filed at 11/06/14 [id number]  Gross per 24 hour  Intake 2546.25 ml  Output   3925 ml  Net -1378.75 ml   Filed Weights   11/04/14 1322 11/04/14 1632  Weight: 113.399 kg (250 lb) 93.441 kg (206 lb)    Exam:  Gen:  in no acute distress       HEENT: moist mucosa  Cardiovascular: normal S1 and S2, no murmurs  Chest: Clear to auscultation bilaterally, no added sounds  Abdomen: Soft, nondistended, nontender, bowel sounds present  Extremities: Warm, no edema, cellulitis over b/l forearm with needle marks and swelling with induration , tenderness to palpation on left , no fluctuation or discharge. Needle marks over b/l legs  CNS: Alert and oriented  Data Reviewed: Basic Metabolic Panel:  Recent Labs Lab 11/04/14 1337 11/05/14 0400  NA 141 144  K 4.1 3.9  CL 106 108  CO2 24 25  GLUCOSE 114* 94  BUN  7 8  CREATININE 0.99 1.12  CALCIUM 8.7 8.5   Liver Function Tests:  Recent Labs Lab 11/04/14 1337 11/05/14 0400  AST 18 13  ALT 13 11  ALKPHOS 68 54  BILITOT 0.2* 0.3  PROT 6.9 5.7*  ALBUMIN 3.2* 2.6*   No results for input(s): LIPASE, AMYLASE in the last 168 hours. No results for input(s):  AMMONIA in the last 168 hours. CBC:  Recent Labs Lab 11/04/14 1337 11/05/14 0400  WBC 7.8 6.2  NEUTROABS 5.5  --   HGB 11.8* 11.1*  HCT 36.6* 35.2*  MCV 93.1 94.4  PLT 258 246   Cardiac Enzymes:  Recent Labs Lab 11/04/14 1337 11/04/14 1740 11/04/14 2241 11/05/14 0400  TROPONINI 0.46* 0.65* 0.53* <0.30   BNP (last 3 results) No results for input(s): PROBNP in the last 8760 hours. CBG: No results for input(s): GLUCAP in the last 168 hours.  Recent Results (from the past 240 hour(s))  Culture, blood (routine x 2)     Status: None (Preliminary result)   Collection Time: 11/04/14  1:37 PM  Result Value Ref Range Status   Specimen Description BLOOD RIGHT HAND  Final   Special Requests BOTTLES DRAWN AEROBIC AND ANAEROBIC 6CC  Final   Culture NO GROWTH 1 DAY  Final   Report Status PENDING  Incomplete  Culture, blood (routine x 2)     Status: None (Preliminary result)   Collection Time: 11/04/14  5:40 PM  Result Value Ref Range Status   Specimen Description BLOOD RIGHT ARM  Final   Special Requests BOTTLES DRAWN AEROBIC AND ANAEROBIC 8CC  Final   Culture NO GROWTH 1 DAY  Final   Report Status PENDING  Incomplete     Studies: [place] Venous Img Upper Bilat  11/05/2014   CLINICAL DATA:  Bilateral upper extremity edema and pain. History of IV drug abuse and diabetes.  EXAM: BILATERAL UPPER EXTREMITY VENOUS DOPPLER ULTRASOUND  TECHNIQUE: Gray-scale sonography with graded compression, as well as color Doppler and duplex ultrasound were performed to evaluate the bilateral upper extremity deep venous systems from the level of the subclavian vein and including the jugular, axillary, basilic, radial, ulnar and upper cephalic vein. Spectral Doppler was utilized to evaluate flow at rest and with distal augmentation maneuvers.  COMPARISON:  None.  FINDINGS: RIGHT UPPER EXTREMITY  Internal Jugular Vein: No evidence of thrombus. Normal compressibility, respiratory phasicity and response to  augmentation.  Subclavian Vein: No evidence of thrombus. Normal compressibility, respiratory phasicity and response to augmentation.  Axillary Vein: No evidence of thrombus. Normal compressibility, respiratory phasicity and response to augmentation.  Cephalic Vein: There is evidence of superficial thrombophlebitis of the cephalic vein with nonocclusive thrombus present beginning in the upper arm near the elbow and continuing to the level of the shoulder.  Basilic Vein: No evidence of thrombus. Normal compressibility, respiratory phasicity and response to augmentation.  Brachial Veins: Mural nonocclusive thrombus is identified in a brachial vein of the upper arm over a relatively short segment. Based on appearance, this thrombus may be subacute/chronic in nature and is partly echogenic in appearance.  Radial Veins: No evidence of thrombus. Normal compressibility, respiratory phasicity and response to augmentation.  Ulnar Veins: No evidence of thrombus. Normal compressibility, respiratory phasicity and response to augmentation.  Venous Reflux:  None.  Other Findings:  No abnormal fluid collections.  LEFT UPPER EXTREMITY  Internal Jugular Vein: No evidence of thrombus. Normal compressibility, respiratory phasicity and response to augmentation.  Subclavian Vein: No evidence of  thrombus. Normal compressibility, respiratory phasicity and response to augmentation.  Axillary Vein: No evidence of thrombus. Normal compressibility, respiratory phasicity and response to augmentation.  Cephalic Vein: Occlusive thrombus is identified in the cephalic vein at the level of the wrist. The cephalic vein is otherwise normally patent.  Basilic Vein: Occlusive thrombus is identified in the left basilic vein at the level of the antecubital fossa.  Brachial Veins: No evidence of thrombus. Normal compressibility, respiratory phasicity and response to augmentation.  Radial Veins: No evidence of thrombus. Normal compressibility, respiratory  phasicity and response to augmentation.  Ulnar Veins: No evidence of thrombus. Normal compressibility, respiratory phasicity and response to augmentation.  Venous Reflux:  None.  Other Findings:  No abnormal fluid collections.  IMPRESSION: 1. Superficial thrombophlebitis of the right cephalic vein which may be subacute/chronic. Nonocclusive DVT in a right brachial vein of the upper arm. 2. Superficial thrombophlebitis of the left cephalic vein at the level of the wrist and left basilic vein at the level of the antecubital fossa. No true deep venous thrombus in the left upper extremity.   Electronically Signed   By: Irish Lack M.D.   On: 11/05/2014 17:23   Dg Chest Portable 1 View  11/04/2014   CLINICAL DATA:  Suicidal ideation  EXAM: PORTABLE CHEST - 1 VIEW  COMPARISON:  05/19/2011  FINDINGS: Lungs are essentially clear. No focal consolidation. No pleural effusion or pneumothorax.  The heart is normal in size.  IMPRESSION: No evidence of acute cardiopulmonary disease.   Electronically Signed   By: Charline Bills M.D.   On: 11/04/2014 14:37    Scheduled Meds: . ARIPiprazole  10 mg Oral QHS  . aspirin EC  81 mg Oral Daily  . atorvastatin  20 mg Oral Daily  . benazepril  20 mg Oral Daily   And  . hydrochlorothiazide  25 mg Oral Daily  . buPROPion  300 mg Oral Daily  . clindamycin (CLEOCIN) IV  600 mg Intravenous Q8H  . clopidogrel  75 mg Oral Daily  . doxepin  150 mg Oral QHS  . FLUoxetine  40 mg Oral Daily  . folic acid  1 mg Oral Daily  . heparin  5,000 Units Subcutaneous 3 times per day  . meloxicam  15 mg Oral Daily  . multivitamin with minerals  1 tablet Oral Daily  . nicotine  14 mg Transdermal Daily  . senna  1 tablet Oral BID  . thiamine  100 mg Oral Daily   Or  . thiamine  100 mg Intravenous Daily  . tiZANidine  4 mg Oral QHS   Continuous Infusions: . sodium chloride 75 mL/hr at 11/05/14 2118       Time spent: 25 minuetes    Eddie North  [facility] Pager [phone] If 7PM-7AM, please contact night-coverage at [email or web address], password [email or web address] 11/06/2014, 8:42 AM  LOS: 2 days

## 2014-11-07 DIAGNOSIS — F332 Major depressive disorder, recurrent severe without psychotic features: Secondary | ICD-10-CM | POA: Insufficient documentation

## 2014-11-07 DIAGNOSIS — R45851 Suicidal ideations: Secondary | ICD-10-CM

## 2014-11-07 LAB — CBC
HCT: 41.5 % (ref 39.0–52.0)
Hemoglobin: 12.9 g/dL — ABNORMAL LOW (ref 13.0–17.0)
MCH: 29.7 pg (ref 26.0–34.0)
MCHC: 31.1 g/dL (ref 30.0–36.0)
MCV: 95.4 fL (ref 78.0–100.0)
PLATELETS: 228 10*3/uL (ref 150–400)
RBC: 4.35 MIL/uL (ref 4.22–5.81)
RDW: 14.5 % (ref 11.5–15.5)
WBC: 6.4 10*3/uL (ref 4.0–10.5)

## 2014-11-07 MED ORDER — OXYCODONE HCL 5 MG PO TABS: 5.0000 mg | ORAL_TABLET | Freq: Four times a day (QID) | ORAL | Status: DC | PRN

## 2014-11-07 MED ORDER — RIVAROXABAN 15 MG PO TABS: 15.0000 mg | ORAL_TABLET | Freq: Two times a day (BID) | ORAL | Status: DC

## 2014-11-07 MED ORDER — CARVEDILOL 6.25 MG PO TABS: 6.2500 mg | ORAL_TABLET | Freq: Two times a day (BID) | ORAL | Status: DC

## 2014-11-07 MED ORDER — CLINDAMYCIN HCL 300 MG PO CAPS: 600.0000 mg | ORAL_CAPSULE | Freq: Three times a day (TID) | ORAL | Status: DC

## 2014-11-07 MED ORDER — NICOTINE 14 MG/24HR TD PT24: 14.0000 mg | MEDICATED_PATCH | Freq: Every day | TRANSDERMAL | Status: DC

## 2014-11-07 NOTE — Clinical Social Work Note (Signed)
CSW notified [name] with TTS that pt has been medically cleared by MD. Pt will have repeat assessment today.  Derenda FennelKara Harneet [place], [place] [phone]

## 2014-11-07 NOTE — Progress Notes (Signed)
Spoke with Derrell Lollingoris Best, [facility].  Answered questions regarding pt status and ADLs.  She said they would be in touch [facility].

## 2014-11-07 NOTE — Clinical Social Work Note (Addendum)
Per TTS, pt will need to be reassessed again in AM for recommendations.   Derenda FennelKara Coburn [place], [place]CS[place] [phone]

## 2014-11-07 NOTE — Progress Notes (Signed)
Pt will be reassessed by psychiatry to determine if pt continues meets inpatient criteria for psychiatric treatment.  Derrell Lollingoris Maleko Greulich, MSW  Social Worker [phone]

## 2014-11-07 NOTE — Consult Note (Signed)
[facility] Tele[facility] Assessment Note  Subjective: Pt seen and chart reviewed. Pt reports fleeting suicidal thoughts and thoughts of "not wanting to be here", but contracts for safety if he is sent home. He denies HI and AVH at this time. In speaking with his wife [name], she informed this NP that she can keep the patient safe, has already removed any weapons from the house and any dangerous medications (discussed in detail), and that she can take him to his followup appointments. She reports that he made suicidal statements following her threat to leave him if he does not stop using cocaine, but that he has been very depressed since the loss of his father earlier this year. Given the specific plan, yet taking into account the collateral information, this NP and Dr. Jama Flavorsobos concur that the patient does not necessarily meet inpatient criteria for psychiatric hospitalization and may benefit from followup treatment given his strong support system. However, patient needs to be observed overnight to be re-evaluated tomorrow morning to obtain more objective findings from staff members, further affirming lack of self-injurious gestures and statements so that patient can be discharged safely.   HPI: Angel Donovan is an 48 y.o. male. Pt presents with C/O Substance Abuse Addiction and SI with intent to use so much Cocaine that he would go into cardiac arrest and die. Pt reports that [facility] accompanied by his wife in efforts to seek help for his substance abuse addiction. Pt reports that he had some medical complaints about his arm and [facility] recommended that he present to [facility] to be evaluated. Pt presents tearful and depressed. Pt reports that he has been sober from Cocaine for 15 years and recently relapsed on Cocaine about 3 weeks ago and has been using daily since relapse. Pt reports that his relapse was triggered by the death of his father, who died earlier this year. Pt reports that he  feels empty without his father. Pt denies use of any other substances. Pt reports a history of AH when using Cocaine. Pt denies active HI and no AVH reported. Pt currently denies SI, but states multiple times throughout assessment that his intent is to use as much Cocaine as possible in hopes that his heart could no longer take it and he would go into cardiac arrest and die. Pt is unable to reliably contract for safety. Pt reports a decline in hygiene,grooming, sleep, and appetite. Pt appears to be decompensating and is unable to function.    Axis I: Major Depression, Recurrent severe, Substance Abuse and Substance Induced Mood Disorder Axis II: Deferred Axis III:  Past Medical History  Diagnosis Date  . 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 Ejection fraction < 50%     EF 30% echo November, 2011 (MI)  / EF 40%, echo, months after MI  /  EF 45%, cardiac catheterization, June, 201 to  . CHF (congestive heart failure)     Initially post MI, then improved.  . Depression   . Hepatitis C   . Hypertension   . History of cocaine abuse     in 80's, ex-IVDU  . History of alcohol abuse   . Pulmonary nodule   . Anal fissure   . Dizziness     November, 2013  . Carotid artery  disease     Doppler, June, 2014, 1-39% bilateral   Axis IV: other psychosocial or environmental problems, problems related to social environment and problems with primary support group Axis V: 41-50 serious symptoms  Psychiatric Specialty Exam: Physical Exam  ROS  Blood pressure 96/56, pulse 80, temperature 97.5 F (36.4 C), temperature source Oral, resp. rate 20, height 5\' 10"  (1.778 m), weight 93.441 kg (206 lb), SpO2 96 %.Body mass index is 29.56 kg/(m^2).   General Appearance: Casual and Fairly Groomed  Proofreaderye  Contact:: Good  Speech: Clear and Coherent and Normal Rate  Volume: Normal  Mood: Anxious and Depressed  Affect: Congruent and Depressed  Thought Process: Coherent and Goal Directed  Orientation: Full (Time, Place, and Person)  Thought Content: WDL  Suicidal Thoughts: Yes, fleeting, without plan  Homicidal Thoughts: No  Memory: Immediate; Fair Recent; Fair Remote; Fair  Judgement: Fair  Insight: Lacking  Psychomotor Activity: Normal  Concentration: Good  Recall: Good  Akathisia: No  Handed:   AIMS (if indicated):    Assets: Communication Skills Desire for Improvement Resilience  Sleep:        Past Medical History:  Past Medical History  Diagnosis Date  . 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 Ejection fraction < 50%     EF 30% echo November, 2011 (MI)  / EF 40%, echo, months after MI  /  EF 45%, cardiac catheterization, June, 201 to  . CHF (congestive heart failure)     Initially post MI, then improved.  . Depression   . Hepatitis C   . Hypertension   . History of cocaine abuse     in 80's, ex-IVDU  . History of alcohol abuse   . Pulmonary nodule   . Anal fissure   . Dizziness     November, 2013  . Carotid artery disease     Doppler, June, 2014, 1-39% bilateral    Past Surgical History  Procedure Laterality Date  . Cardiac catheterization      with PCI RCA 10/19/2010    Family History:  Family History  Problem Relation Age of Onset  . Coronary artery disease Father   . Cancer Father     lymphoma  . Cancer - Colon Father     Social History:  reports that he quit smoking about 4 years ago. His smoking use included Cigarettes. He has a 35 pack-year smoking history. He has never used smokeless tobacco.  He reports that he uses illicit drugs (Cocaine). He reports that he does not drink alcohol.  Additional Social History:  Alcohol / Drug Use History of alcohol / drug use?: Yes Substance #1 Name of Substance 1:  (Cocaine) 1 - Age of First Use:  (18) 1 - Amount (size/oz):  (7-8 oz ) 1 - Frequency:  (daily for the past 3 weeks) 1 - Duration:  (on-going use since recent relapse about 3 weeks ago) 1 - Last Use / Amount:  (11/02/14- 5 grams)  CIWA: CIWA-Ar BP: (!) 96/56 mmHg Pulse Rate: 80 Nausea and Vomiting: no nausea and no vomiting Tactile Disturbances: none Tremor: no tremor Auditory Disturbances: not present Paroxysmal Sweats: no sweat visible Visual Disturbances: not present Anxiety: no anxiety, at ease Headache, Fullness in Head: none present Agitation: normal activity Orientation and Clouding of Sensorium: oriented and  can do serial additions CIWA-Ar Total: 0 COWS:    PATIENT STRENGTHS: (choose at least two) Ability for insight Motivation for treatment/growth  Allergies: No Known Allergies  Home Medications:  Medications Prior to Admission  Medication Sig Dispense Refill  . ARIPiprazole (ABILIFY) 10 MG tablet Take 10 mg by mouth at bedtime.    Marland Kitchen aspirin EC 81 MG tablet Take 81 mg by mouth daily.    Marland Kitchen atorvastatin (LIPITOR) 20 MG tablet Take 1 tablet (20 mg total) by mouth daily.    . benazepril-hydrochlorthiazide (LOTENSIN HCT) 20-25 MG per tablet Take 1 tablet by mouth daily.    Marland Kitchen buPROPion (WELLBUTRIN XL) 300 MG 24 hr tablet Take 300 mg by mouth daily.    . clopidogrel (PLAVIX) 75 MG tablet Take 1 tablet (75 mg total) by mouth daily. 30 tablet 6  . diazepam (VALIUM) 5 MG tablet Take 5 mg by mouth 3 (three) times daily.    Marland Kitchen doxepin (SINEQUAN) 150 MG capsule Take 1 capsule (150 mg total) by mouth at bedtime. 30 capsule 0  . famotidine (PEPCID) 20 MG tablet Take 1 tablet (20 mg total) by mouth 2 (two) times daily.    Marland Kitchen FLUoxetine (PROZAC) 40 MG capsule Take 1 capsule  (40 mg total) by mouth daily. 30 capsule 0  . meloxicam (MOBIC) 15 MG tablet Take 1 tablet (15 mg total) by mouth daily.    . Omega-3 Fatty Acids (FISH OIL) 1000 MG CAPS Take 1 capsule (1,000 mg total) by mouth 2 (two) times daily.  0  . tiZANidine (ZANAFLEX) 4 MG tablet Take 1 tablet (4 mg total) by mouth at bedtime. 30 tablet 0  . [DISCONTINUED] carvedilol (COREG) 25 MG tablet Take 1 tablet (25 mg total) by mouth 2 (two) times daily.    Marland Kitchen albuterol (PROAIR HFA) 108 (90 BASE) MCG/ACT inhaler Inhale 1 puff into the lungs every 6 (six) hours as needed. (Patient taking differently: Inhale 1 puff into the lungs every 6 (six) hours as needed for wheezing or shortness of breath. )    . ARIPiprazole (ABILIFY) 5 MG tablet Take 1 tablet (5 mg total) by mouth at bedtime. (Patient not taking: Reported on 11/04/2014) 30 tablet 0  . aspirin 81 MG tablet Take 1 tablet (81 mg total) by mouth at bedtime. (Patient not taking: Reported on 11/04/2014) 30 tablet   . BuPROPion HCl ER, XL, 450 MG TB24 Take 450 mg by mouth daily. (Patient not taking: Reported on 11/04/2014) 30 tablet 0  . nitroGLYCERIN (NITROSTAT) 0.4 MG SL tablet Place 1 tablet (0.4 mg total) under the tongue every 5 (five) minutes as needed. 25 tablet 3    OB/GYN Status:  No LMP for male patient.  Disposition:  -Hold overnight for observation for safety and re-evaluate by psychiatry in AM per Dr. Jama Flavors. -Consider discharge in AM if objective staff findings support stability found today.   Beau Fanny, FNP-BC 11/07/2014 12:25PM  Case discussed with me as above Nehemiah Massed, MD

## 2014-11-07 NOTE — Discharge Summary (Addendum)
Physician Discharge Summary  Angel Donovan ZOX:[id number] DOB: 07-24-1966 DOA: 11/04/2014  PCP: Toma Deiters, MD  Admit date: 11/04/2014 Discharge date: 11/07/2014  Time spent: 35 minutes  Recommendations for Outpatient Follow-up:  1. Discharged to inpatient behavioral health 2. Patient started on Xarelto for DVT of right brachial vein. He will need to be treated for total of 3 months. 3. Resumed low-dose carvedilol which needs to be titrated slowly to his regular dose (25 mg twice daily) 4. Completes antibiotics on 12/24   Discharge Diagnoses:  Principal Problem:   Suicide attempt   Active Problems:   Non-STEMI (non-ST elevated myocardial infarction)   Thrombosis of brachial vein    Cellulitis of left upper extremity   Cellulitis of right upper extremity   Cocaine use   Chronic hepatitis C   TOBACCO USE   Depression   Essential hypertension   Coronary artery disease   Chronic combined systolic and diastolic CHF (congestive heart failure)   Polysubstance abuse   HLD (hyperlipidemia)   Left arm swelling      Discharge Condition: Fair  Diet recommendation: Heart healthy  Filed Weights   11/04/14 1322 11/04/14 1632  Weight: 113.399 kg (250 lb) 93.441 kg (206 lb)    History of present illness:  Please refer to admission H&P for details, but in brief,48 year old male with history of major depression, alcohol induced mood disorder, coronary artery disease with PCI 2011, CHF, hepatitis C, hypertension, polysubstance abuse including alcohol and cocaine use presented to the ED with suicidal ideation for over 20 weeks. He was injecting cocaine daily in b/l arms and feet. He reported bilateral upper extremity swelling from the injection associated with chills, headache and diarrhea. He also reported chest pain about 1 week back while using cocaine. Patient found to have bilateral arm cellulitis as well as elevated troponin with normal EKG. Patient admitted for suicidal  ideations, cellulitis of bilateral arm and NSTEMI in the setting of active cocaine use.  Hospital Course:  Suicidal ideations/ attempt Patient reports having a lot of personal stress. Still having suicidal thoughts occasionally (last thoughts during the night). . Continue bedside sitter. Resumed home antidepressants. Tele psych consulted. Seems appropriate for inpatient psych admission and management. Patient is medically cleared at this time.    Cellulitis of bilateral upper extremity Patient reports injecting tap Water and cocaine into the veins. Antecubital veins are enlarged and indurated. BUE [place] negative for abscess but shows rt brachial non occlusive DVT.  - blood cx negative -Placed on empiric clindamycin and will discharged on the same to complete a total 10 day course. (Completes on 11/14/2014)  Right brachial vein DVT Non occlusive DVT seen on [place]. Needs anticoagulation for 3 months. Started on xarelto.   NSTEMI in the setting of cocaine use possibly related to ACS with cocaine use. Patient reported chest pain 2 days prior to admission to the ED physician. To me he says it was almost a week ago. EKG unremarkable. Troponin peaked to 0.65 on admission which subsequently normalized.. 2 d echo with EF of 45% and Mild hypokinesis of the mid inferior and basal-mid inferolateral myocardium. (Seems improved from previous echo) .  -Carvedilol was placed on hold. Patient is on both aspirin and Plavix with history of PCI in 2011. Patient follows with Dr. Myrtis Ser. Since there also is also added, I discussed with cardiology consult Dr. Morton Peters on the phone, who recommend to hold aspirin while patient is on Xarelto. Patient will continue Plavix and Xarelto for now and  once off Xarelto in 3 months aspirin should be resumed. Also recommended resuming carvedilol at a lower dose and titrate slowly to his home dose. -Patient again counseled on cocaine and tobacco cessation. He should  follow-up with Dr. Myrtis Ser as outpatient.  coronary artery disease with systolic CHF Patient currently euvolemic. Hold aspirin. Continue Plavix and statin. Continue ACE inhibitor. Resume carvedilol at a lower dose as discussed above. 2d echo with EF of 45-50% with hypokinesis. Improved EF from prior echo. Euvolemic.   Polysubstance abuse Ongoing cocaine use. Denies alcohol use for past several weeks.  Social work following Nicotine patch ordered.     Diet; heart healthy   Code Status: Full code Communication: none at bedside Disposition Plan: Discharge to inpatient psych once bed available. Patient medically cleared.   Consultants:  Tele- psych     Antibiotics:  IV clindamycin since 12/14    Procedures:  Doppler bilateral upper extremities  2-D echo  Consultations:  None  Discharge Exam: Filed Vitals:   11/07/14 0632  BP: 101/63  Pulse: 64  Temp: 97.8 F (36.6 C)  Resp: 32        YQM:VHQION aged male in no acute distress  HEENT: Pallor, moist mucosa  Cardiovascular: normal S1 and S2, no murmurs  Chest: Clear to auscultation bilaterally, no added sounds  Abdomen: Soft, nondistended, nontender, bowel sounds present  Extremities: Warm, no edema, cellulitis over b/l forearm with needle marks and swelling with induration , tenderness to palpation on left , no fluctuation or discharge. Needle marks over b/l legs  CNS: Alert and oriented  Discharge Instructions You were cared for by a hospitalist during your hospital stay. If you have any questions about your discharge medications or the care you received while you were in the hospital after you are discharged, you can call the unit and asked to speak with the hospitalist on call if the hospitalist that took care of you is not available. Once you are discharged, your primary care physician will handle any further medical issues. Please note that NO REFILLS for any discharge medications will be  authorized once you are discharged, as it is imperative that you return to your primary care physician (or establish a relationship with a primary care physician if you do not have one) for your aftercare needs so that they can reassess your need for medications and monitor your lab values.   Current Discharge Medication List    START taking these medications   Details  clindamycin (CLEOCIN) 300 MG capsule Take 2 capsules (600 mg total) by mouth 3 (three) times daily. Qty: 48 capsule, Refills: 0    nicotine (NICODERM CQ - DOSED IN MG/24 HOURS) 14 mg/24hr patch Place 1 patch (14 mg total) onto the skin daily. Qty: 28 patch, Refills: 0    oxyCODONE (OXY IR/ROXICODONE) 5 MG immediate release tablet Take 1 tablet (5 mg total) by mouth every 6 (six) hours as needed for moderate pain. Qty: 20 tablet, Refills: 0    Rivaroxaban (XARELTO) 15 MG TABS tablet Take 1 tablet (15 mg total) by mouth 2 (two) times daily with a meal. Qty: 60 tablet, Refills: 0      CONTINUE these medications which have CHANGED   Details  carvedilol (COREG) 6.25 MG tablet Take 1 tablet (6.25 mg total) by mouth 2 (two) times daily. Qty: 60 tablet, Refills: 0      CONTINUE these medications which have NOT CHANGED   Details  ARIPiprazole (ABILIFY) 10 MG tablet Take 10  mg by mouth at bedtime.    atorvastatin (LIPITOR) 20 MG tablet Take 1 tablet (20 mg total) by mouth daily.    benazepril-hydrochlorthiazide (LOTENSIN HCT) 20-25 MG per tablet Take 1 tablet by mouth daily.    buPROPion (WELLBUTRIN XL) 300 MG 24 hr tablet Take 300 mg by mouth daily.    clopidogrel (PLAVIX) 75 MG tablet Take 1 tablet (75 mg total) by mouth daily. Qty: 30 tablet, Refills: 6    doxepin (SINEQUAN) 150 MG capsule Take 1 capsule (150 mg total) by mouth at bedtime. Qty: 30 capsule, Refills: 0    famotidine (PEPCID) 20 MG tablet Take 1 tablet (20 mg total) by mouth 2 (two) times daily.    FLUoxetine (PROZAC) 40 MG capsule Take 1 capsule (40  mg total) by mouth daily. Qty: 30 capsule, Refills: 0    meloxicam (MOBIC) 15 MG tablet Take 1 tablet (15 mg total) by mouth daily.    Omega-3 Fatty Acids (FISH OIL) 1000 MG CAPS Take 1 capsule (1,000 mg total) by mouth 2 (two) times daily. Refills: 0    tiZANidine (ZANAFLEX) 4 MG tablet Take 1 tablet (4 mg total) by mouth at bedtime. Qty: 30 tablet, Refills: 0    albuterol (PROAIR HFA) 108 (90 BASE) MCG/ACT inhaler Inhale 1 puff into the lungs every 6 (six) hours as needed.    nitroGLYCERIN (NITROSTAT) 0.4 MG SL tablet Place 1 tablet (0.4 mg total) under the tongue every 5 (five) minutes as needed. Qty: 25 tablet, Refills: 3      STOP taking these medications     aspirin EC 81 MG tablet      diazepam (VALIUM) 5 MG tablet      aspirin 81 MG tablet        No Known Allergies Follow-up Information    Follow up with HASANAJ,[name], MD In 1 week.   Specialty:  Internal Medicine   Why:  after discharge from [facility]   Contact information:   [address] [place] [address] [phone]       Follow up with Willa Rough, MD. Schedule an appointment as soon as possible for a visit in 2 months.   Specialty:  Cardiology   Contact information:   [address] [place] [address] [phone]        The results of significant diagnostics from this hospitalization (including imaging, microbiology, ancillary and laboratory) are listed below for reference.    Significant Diagnostic Studies: US Venous Img Upper Bilat  11/05/2014   CLINICAL DATA:  Bilateral upper extremity edema and pain. History of IV drug abuse and diabetes.  EXAM: BILATERAL UPPER EXTREMITY VENOUS DOPPLER ULTRASOUND  TECHNIQUE: Gray-scale sonography with graded compression, as well as color Doppler and duplex ultrasound were performed to evaluate the bilateral upper extremity deep venous systems from the level of the subclavian vein and including the jugular, axillary, basilic, radial, ulnar  and upper cephalic vein. Spectral Doppler was utilized to evaluate flow at rest and with distal augmentation maneuvers.  COMPARISON:  None.  FINDINGS: RIGHT UPPER EXTREMITY  Internal Jugular Vein: No evidence of thrombus. Normal compressibility, respiratory phasicity and response to augmentation.  Subclavian Vein: No evidence of thrombus. Normal compressibility, respiratory phasicity and response to augmentation.  Axillary Vein: No evidence of thrombus. Normal compressibility, respiratory phasicity and response to augmentation.  Cephalic Vein: There is evidence of superficial thrombophlebitis of the cephalic vein with nonocclusive thrombus present beginning in the upper arm near the  elbow and continuing to the level of the shoulder.  Basilic Vein: No evidence of thrombus. Normal compressibility, respiratory phasicity and response to augmentation.  Brachial Veins: Mural nonocclusive thrombus is identified in a brachial vein of the upper arm over a relatively short segment. Based on appearance, this thrombus may be subacute/chronic in nature and is partly echogenic in appearance.  Radial Veins: No evidence of thrombus. Normal compressibility, respiratory phasicity and response to augmentation.  Ulnar Veins: No evidence of thrombus. Normal compressibility, respiratory phasicity and response to augmentation.  Venous Reflux:  None.  Other Findings:  No abnormal fluid collections.  LEFT UPPER EXTREMITY  Internal Jugular Vein: No evidence of thrombus. Normal compressibility, respiratory phasicity and response to augmentation.  Subclavian Vein: No evidence of thrombus. Normal compressibility, respiratory phasicity and response to augmentation.  Axillary Vein: No evidence of thrombus. Normal compressibility, respiratory phasicity and response to augmentation.  Cephalic Vein: Occlusive thrombus is identified in the cephalic vein at the level of the wrist. The cephalic vein is otherwise normally patent.  Basilic Vein:  Occlusive thrombus is identified in the left basilic vein at the level of the antecubital fossa.  Brachial Veins: No evidence of thrombus. Normal compressibility, respiratory phasicity and response to augmentation.  Radial Veins: No evidence of thrombus. Normal compressibility, respiratory phasicity and response to augmentation.  Ulnar Veins: No evidence of thrombus. Normal compressibility, respiratory phasicity and response to augmentation.  Venous Reflux:  None.  Other Findings:  No abnormal fluid collections.  IMPRESSION: 1. Superficial thrombophlebitis of the right cephalic vein which may be subacute/chronic. Nonocclusive DVT in a right brachial vein of the upper arm. 2. Superficial thrombophlebitis of the left cephalic vein at the level of the wrist and left basilic vein at the level of the antecubital fossa. No true deep venous thrombus in the left upper extremity.   Electronically Signed   By: Irish Lack M.D.   On: 11/05/2014 17:23   Dg Chest Portable 1 View  11/04/2014   CLINICAL DATA:  Suicidal ideation  EXAM: PORTABLE CHEST - 1 VIEW  COMPARISON:  05/19/2011  FINDINGS: Lungs are essentially clear. No focal consolidation. No pleural effusion or pneumothorax.  The heart is normal in size.  IMPRESSION: No evidence of acute cardiopulmonary disease.   Electronically Signed   By: Charline Bills M.D.   On: 11/04/2014 14:37    Microbiology: Recent Results (from the past 240 hour(s))  Culture, blood (routine x 2)     Status: None (Preliminary result)   Collection Time: 11/04/14  1:37 PM  Result Value Ref Range Status   Specimen Description BLOOD RIGHT HAND  Final   Special Requests BOTTLES DRAWN AEROBIC AND ANAEROBIC 6CC  Final   Culture NO GROWTH 2 DAYS  Final   Report Status PENDING  Incomplete  Culture, blood (routine x 2)     Status: None (Preliminary result)   Collection Time: 11/04/14  5:40 PM  Result Value Ref Range Status   Specimen Description BLOOD RIGHT ARM  Final   Special  Requests BOTTLES DRAWN AEROBIC AND ANAEROBIC 8CC  Final   Culture NO GROWTH 2 DAYS  Final   Report Status PENDING  Incomplete     Labs: Basic Metabolic Panel:  Recent Labs Lab 11/04/14 1337 11/05/14 0400  NA 141 144  K 4.1 3.9  CL 106 108  CO2 24 25  GLUCOSE 114* 94  BUN 7 8  CREATININE 0.99 1.12  CALCIUM 8.7 8.5   Liver Function Tests:  Recent Labs Lab 11/04/14 1337 11/05/14 0400  AST 18 13  ALT 13 11  ALKPHOS 68 54  BILITOT 0.2* 0.3  PROT 6.9 5.7*  ALBUMIN 3.2* 2.6*   No results for input(s): LIPASE, AMYLASE in the last 168 hours. No results for input(s): AMMONIA in the last 168 hours. CBC:  Recent Labs Lab 11/04/14 1337 11/05/14 0400 11/07/14 0540  WBC 7.8 6.2 6.4  NEUTROABS 5.5  --   --   HGB 11.8* 11.1* 12.9*  HCT 36.6* 35.2* 41.5  MCV 93.1 94.4 95.4  PLT 258 246 228   Cardiac Enzymes:  Recent Labs Lab 11/04/14 1337 11/04/14 1740 11/04/14 2241 11/05/14 0400  TROPONINI 0.46* 0.65* 0.53* <0.30   BNP: BNP (last 3 results) No results for input(s): PROBNP in the last 8760 hours. CBG: No results for input(s): GLUCAP in the last 168 hours.     SignedEddie North  [facility] 11/07/2014, 9:03 AM

## 2014-11-07 NOTE — Progress Notes (Addendum)
[facility]  Pt will stay at [facility] overnight and be reevaluated in the morning.  Pt and pt's wife updated.

## 2014-11-08 ENCOUNTER — Inpatient Hospital Stay (HOSPITAL_COMMUNITY)
Admission: AD | Admit: 2014-11-08 | Discharge: 2014-11-11 | DRG: 885 | Disposition: A | Discharge status: Home / Self Care | Payer: 59 | Source: Intra-hospital | Attending: Psychiatry | Admitting: Psychiatry

## 2014-11-08 ENCOUNTER — Telehealth: Payer: Self-pay | Admitting: Cardiology

## 2014-11-08 ENCOUNTER — Encounter (HOSPITAL_COMMUNITY): Payer: Self-pay

## 2014-11-08 DIAGNOSIS — Z87891 Personal history of nicotine dependence: Secondary | ICD-10-CM

## 2014-11-08 DIAGNOSIS — F1414 Cocaine abuse with cocaine-induced mood disorder: Secondary | ICD-10-CM | POA: Diagnosis present

## 2014-11-08 DIAGNOSIS — I10 Essential (primary) hypertension: Secondary | ICD-10-CM | POA: Diagnosis present

## 2014-11-08 DIAGNOSIS — F1994 Other psychoactive substance use, unspecified with psychoactive substance-induced mood disorder: Secondary | ICD-10-CM

## 2014-11-08 DIAGNOSIS — I252 Old myocardial infarction: Secondary | ICD-10-CM | POA: Diagnosis not present

## 2014-11-08 DIAGNOSIS — F1021 Alcohol dependence, in remission: Secondary | ICD-10-CM | POA: Diagnosis present

## 2014-11-08 DIAGNOSIS — R45851 Suicidal ideations: Secondary | ICD-10-CM | POA: Diagnosis present

## 2014-11-08 DIAGNOSIS — F332 Major depressive disorder, recurrent severe without psychotic features: Secondary | ICD-10-CM | POA: Diagnosis present

## 2014-11-08 DIAGNOSIS — I2510 Atherosclerotic heart disease of native coronary artery without angina pectoris: Secondary | ICD-10-CM | POA: Diagnosis present

## 2014-11-08 HISTORY — DX: Unspecified asthma, uncomplicated: J45.909

## 2014-11-08 MED ORDER — BUPROPION HCL ER (XL) 300 MG PO TB24
300.0000 mg | ORAL_TABLET | Freq: Every day | ORAL | Status: DC
  Administered 2014-11-09 – 2014-11-11 (×3): 300 mg via ORAL
  Filled 2014-11-08 (×4): qty 1
  Filled 2014-11-08: qty 4

## 2014-11-08 MED ORDER — VITAMIN B-1 100 MG PO TABS
100.0000 mg | ORAL_TABLET | Freq: Every day | ORAL | Status: DC
  Administered 2014-11-08 – 2014-11-11 (×4): 100 mg via ORAL
  Filled 2014-11-08 (×7): qty 1

## 2014-11-08 MED ORDER — ATORVASTATIN CALCIUM 20 MG PO TABS
20.0000 mg | ORAL_TABLET | Freq: Every day | ORAL | Status: DC
  Administered 2014-11-09 – 2014-11-10 (×2): 20 mg via ORAL
  Filled 2014-11-08 (×4): qty 1

## 2014-11-08 MED ORDER — RIVAROXABAN (XARELTO) VTE STARTER PACK (15 & 20 MG): ORAL_TABLET | ORAL | Status: DC

## 2014-11-08 MED ORDER — ALUM & MAG HYDROXIDE-SIMETH 200-200-20 MG/5ML PO SUSP: 30.0000 mL | ORAL | Status: DC | PRN

## 2014-11-08 MED ORDER — ALBUTEROL SULFATE HFA 108 (90 BASE) MCG/ACT IN AERS: 1.0000 | INHALATION_SPRAY | RESPIRATORY_TRACT | Status: DC | PRN

## 2014-11-08 MED ORDER — ARIPIPRAZOLE 10 MG PO TABS
10.0000 mg | ORAL_TABLET | Freq: Every day | ORAL | Status: DC
  Administered 2014-11-08 – 2014-11-11 (×4): 10 mg via ORAL
  Filled 2014-11-08: qty 4
  Filled 2014-11-08 (×6): qty 1

## 2014-11-08 MED ORDER — CLINDAMYCIN HCL 300 MG PO CAPS: 600.0000 mg | ORAL_CAPSULE | Freq: Three times a day (TID) | ORAL | Status: DC

## 2014-11-08 MED ORDER — DOXEPIN HCL 50 MG PO CAPS
150.0000 mg | ORAL_CAPSULE | Freq: Every day | ORAL | Status: DC
  Administered 2014-11-08 – 2014-11-10 (×3): 150 mg via ORAL
  Filled 2014-11-08 (×4): qty 3
  Filled 2014-11-08 (×2): qty 2
  Filled 2014-11-08: qty 6

## 2014-11-08 MED ORDER — FOLIC ACID 1 MG PO TABS
1.0000 mg | ORAL_TABLET | Freq: Every day | ORAL | Status: DC
  Administered 2014-11-09 – 2014-11-11 (×3): 1 mg via ORAL
  Filled 2014-11-08 (×5): qty 1

## 2014-11-08 MED ORDER — ADULT MULTIVITAMIN W/MINERALS CH
1.0000 | ORAL_TABLET | Freq: Every day | ORAL | Status: DC
  Administered 2014-11-09 – 2014-11-11 (×3): 1 via ORAL
  Filled 2014-11-08 (×5): qty 1

## 2014-11-08 MED ORDER — HYDROCHLOROTHIAZIDE 25 MG PO TABS
25.0000 mg | ORAL_TABLET | Freq: Every day | ORAL | Status: DC
  Administered 2014-11-09 – 2014-11-10 (×2): 25 mg via ORAL
  Filled 2014-11-08 (×5): qty 1

## 2014-11-08 MED ORDER — MELOXICAM 15 MG PO TABS
15.0000 mg | ORAL_TABLET | Freq: Every day | ORAL | Status: DC
  Administered 2014-11-09 – 2014-11-11 (×3): 15 mg via ORAL
  Filled 2014-11-08 (×5): qty 1

## 2014-11-08 MED ORDER — FLUOXETINE HCL 20 MG PO CAPS
40.0000 mg | ORAL_CAPSULE | Freq: Every day | ORAL | Status: DC
  Administered 2014-11-09 – 2014-11-11 (×3): 40 mg via ORAL
  Filled 2014-11-08: qty 8
  Filled 2014-11-08 (×4): qty 2

## 2014-11-08 MED ORDER — MAGNESIUM HYDROXIDE 400 MG/5ML PO SUSP: 30.0000 mL | Freq: Every day | ORAL | Status: DC | PRN

## 2014-11-08 MED ORDER — ACETAMINOPHEN 325 MG PO TABS: 650.0000 mg | ORAL_TABLET | Freq: Four times a day (QID) | ORAL | Status: DC | PRN

## 2014-11-08 MED ORDER — SENNA 8.6 MG PO TABS
1.0000 | ORAL_TABLET | Freq: Two times a day (BID) | ORAL | Status: DC
  Administered 2014-11-08 – 2014-11-11 (×6): 8.6 mg via ORAL
  Filled 2014-11-08 (×11): qty 1

## 2014-11-08 MED ORDER — CLINDAMYCIN HCL 300 MG PO CAPS
600.0000 mg | ORAL_CAPSULE | Freq: Three times a day (TID) | ORAL | Status: DC
  Administered 2014-11-08 – 2014-11-11 (×9): 600 mg via ORAL
  Filled 2014-11-08 (×10): qty 2
  Filled 2014-11-08: qty 4
  Filled 2014-11-08 (×3): qty 2
  Filled 2014-11-08: qty 4
  Filled 2014-11-08 (×5): qty 2

## 2014-11-08 MED ORDER — RIVAROXABAN 15 MG PO TABS
15.0000 mg | ORAL_TABLET | Freq: Two times a day (BID) | ORAL | Status: DC
  Administered 2014-11-08 – 2014-11-10 (×5): 15 mg via ORAL
  Filled 2014-11-08 (×11): qty 1

## 2014-11-08 MED ORDER — CLOPIDOGREL BISULFATE 75 MG PO TABS
75.0000 mg | ORAL_TABLET | Freq: Every day | ORAL | Status: DC
  Administered 2014-11-09 – 2014-11-11 (×3): 75 mg via ORAL
  Filled 2014-11-08 (×5): qty 1

## 2014-11-08 MED ORDER — BENAZEPRIL HCL 10 MG PO TABS
20.0000 mg | ORAL_TABLET | Freq: Every day | ORAL | Status: DC
  Administered 2014-11-09 – 2014-11-10 (×2): 20 mg via ORAL
  Filled 2014-11-08: qty 2
  Filled 2014-11-08: qty 1
  Filled 2014-11-08 (×2): qty 2
  Filled 2014-11-08: qty 1
  Filled 2014-11-08: qty 2

## 2014-11-08 NOTE — Tx Team (Signed)
Initial Interdisciplinary Treatment Plan   PATIENT STRESSORS: Health problems Loss of father Substance abuse   PATIENT STRENGTHS: Ability for insight Average or above average intelligence Capable of independent living Communication skills Supportive family/friends   PROBLEM LIST: Problem List/Patient Goals Date to be addressed Date deferred Reason deferred Estimated date of resolution  "Suicidal thoughts" 11/08/14     Constant depression 11/08/14                                                DISCHARGE CRITERIA:  Ability to meet basic life and health needs Adequate post-discharge living arrangements Reduction of life-threatening or endangering symptoms to within safe limits  PRELIMINARY DISCHARGE PLAN: Attend aftercare/continuing care group Outpatient therapy Return to previous living arrangement  PATIENT/FAMIILY INVOLVEMENT: This treatment plan has been presented to and reviewed with the patient, Angel Donovan, and/or family member, .  The patient and family have been given the opportunity to ask questions and make suggestions.  Angel Donovan, Angel Donovan [facility] 11/08/2014, 3:13 PM

## 2014-11-08 NOTE — Progress Notes (Signed)
Patient ID: Angel JewelDennis L Donovan, male   DOB: [birth year], 48 y.o.   MRN: [id number]  Patient is a [age]yr old voluntary admission from [facility]. He reports being there since Monday after injecting cocaine into his arms in a suicide attempt. Patient got cellulitis in both arms and was receiving IV Clindamycin. He also has a brachial clot that he is receiving plavix and xerelto for. Aspirin will be discontinued. Reports increased depression after father passed away this year. He stated that he has a history of ETOH abuse and cocaine use but reports he relapsed after 15 years. Has a long history of heart issues including CAD, MI, stents, and HTN. Patient reports mother passed away 5 years ago and father this year. Uncle died from suicide and patient reports 2-3 attempts in the past. Cooperative with admission process.

## 2014-11-08 NOTE — [facility] Suicide Risk Assessment (Signed)
[facility] INPATIENT:  Family/Significant Other Suicide Prevention Education  Suicide Prevention Education:  Education Completed; Angel Donovan, Wife - [phone];  has been identified by the patient as the family member/significant other with whom the patient will be residing, and identified as the person(s) who will aid the patient in the event of a mental health crisis (suicidal ideations/suicide attempt).  With written consent from the patient, the family member/significant other has been provided the following suicide prevention education, prior to the and/or following the discharge of the patient.  The suicide prevention education provided includes the following:  Suicide risk factors  Suicide prevention and interventions  National Suicide Hotline telephone number  [facility] assessment telephone number  [facility] Emergency Assistance 911  [facility] and/or Residential Mobile Crisis Unit telephone number  Request made of family/significant other to:  Remove weapons (e.g., guns, rifles, knives), all items previously/currently identified as safety concern.   Wife advised patient does not have access to weapons.    Remove drugs/medications (over-the-counter, prescriptions, illicit drugs), all items previously/currently identified as a safety concern.  The family member/significant other verbalizes understanding of the suicide prevention education information provided.  The family member/significant other agrees to remove the items of safety concern listed above.  Angel Donovan, Angel Donovan 11/08/2014, 4:07 PM
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
## 2014-11-08 NOTE — [facility] Assessment (Signed)
[facility]

## 2014-11-08 NOTE — Progress Notes (Signed)
Patient attended AA group tonight. 

## 2014-11-08 NOTE — BH Assessment (Signed)
[facility] Assessment Progress Note  Spoke with Luster LandsbergRenee, and gave instructions for faxing voluntary paperwork to [phone]. Pt accepted by Freeman CaldronShelli, NP to Dr. Dub MikesLugo.  [facility].

## 2014-11-08 NOTE — Progress Notes (Signed)
Telepsych in progress sitter at bedside.

## 2014-11-08 NOTE — Consult Note (Signed)
[facility]  Subjective: Pt seen and chart reviewed. Patient denies suicidal ideation and states he feels safe to return home with wife.  HPI: Angel Donovan is an 48 y.o. male. Pt presents with C/O Substance Abuse Addiction and SI with intent to use so much Cocaine that he would go into cardiac arrest and die. Pt [facility] accompanied by his wife in efforts to seek help for his substance abuse addiction. Pt reports that he had some medical complaints about his arm and [facility] recommended that he present to [facility] to be evaluated. Patient states that he had been abusing cocaine all day every day for past three weeks as an attempt to kill himself.  Patient relates "i wanted it to look like an accident"   Pt presents tearful and depressed. Pt reports that he has been sober from Cocaine for 15 years and recently relapsed on Cocaine about 3 weeks ago and has been using daily since relapse. Pt reports that his relapse was triggered by the death of his father, who died earlier this year. Pt reports that he feels empty without his father. Pt denies use of any other substances. Pt reports a history of AH when using Cocaine. Pt denies active HI and no AVH reported. Pt currently denies SI, but states affect is flat, becomes tearful as he discusses thoughts and feelings, speech is monotone.  When asked about safety plan patient states "i tried to overdose on cocaine and that didn't work, so i guess I need to pick my self up."   Patient could not present a specific safety plan.  Patient relates that he gets overwhelmed by grief at times and was tired of living. Pt is unable to reliably contract for safety. Pt reports a decline in hygiene,grooming, sleep, and appetite. Pt appears to be decompensating and is unable to function.  Patient is agreeable to inpatient hospitalization for stabilization of mood and thought processes.     Axis I: Major Depression,  Recurrent severe, Substance Abuse and Substance Induced Mood Disorder Axis II: Deferred Axis III:  Past Medical History  Diagnosis Date  . 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 Ejection fraction < 50%     EF 30% echo November, 2011 (MI)  / EF 40%, echo, months after MI  /  EF 45%, cardiac catheterization, June, 201 to  . CHF (congestive heart failure)     Initially post MI, then improved.  . Depression   . Hepatitis C   . Hypertension   . History of cocaine abuse     in 80's, ex-IVDU  . History of alcohol abuse   . Pulmonary nodule   . Anal fissure   . Dizziness     November, 2013  . Carotid artery disease     Doppler, June, 2014, 1-39% bilateral   Axis IV: other psychosocial or environmental problems, problems related to social environment and problems with primary support group Axis V: 41-50 serious symptoms  Psychiatric Specialty Exam: Physical Exam  ROS  Blood pressure 96/63, pulse 75, temperature 97.6 F (36.4 C), temperature source Oral, resp. rate 18, height 5\' 10"  (1.778 m), weight 93.441 kg (206 lb), SpO2 97 %.Body mass index is 29.56 kg/(m^2).   General Appearance: Casual and  Fairly Groomed  Patent attorney:: Good  Speech: Clear and Coherent somewhat slowed monotone speech   Volume: Normal  Mood:depressed   Affect: Congruent and Depressed flat   Thought Process: Coherent and Goal Directed, slowed   Thought content:  Rumination, grief and loss     Suicidal Thoughts: denies, but thoughts are very focused around death and death wish present.    Homicidal Thoughts: No  Memory: Immediate; Fair Recent; Fair Remote; Fair  Judgement: Fair  Insight: Lacking  Psychomotor Activity: Normal  Concentration: Good  Recall: Good   Akathisia: No  Handed:   AIMS (if indicated):    Assets: Communication Skills Desire for Improvement Resilience  Sleep:        Past Medical History:  Past Medical History  Diagnosis Date  . 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 Ejection fraction < 50%     EF 30% echo November, 2011 (MI)  / EF 40%, echo, months after MI  /  EF 45%, cardiac catheterization, June, 201 to  . CHF (congestive heart failure)     Initially post MI, then improved.  . Depression   . Hepatitis C   . Hypertension   . History of cocaine abuse     in 80's, ex-IVDU  . History of alcohol abuse   . Pulmonary nodule   . Anal fissure   . Dizziness     November, 2013  . Carotid artery disease     Doppler, June, 2014, 1-39% bilateral    Past Surgical History  Procedure Laterality Date  . Cardiac catheterization      with PCI RCA 10/19/2010    Family History:  Family History  Problem Relation Age of Onset  . Coronary artery disease Father   . Cancer Father     lymphoma  . Cancer - Colon Father     Social History:  reports that he quit smoking about 4 years ago. His smoking use included Cigarettes. He has a 35 pack-year smoking history. He has never used smokeless tobacco. He reports that he uses illicit drugs (Cocaine). He reports that he does not drink alcohol.  Additional Social History:  Alcohol / Drug Use History of alcohol / drug use?: Yes Substance #1 Name of Substance 1:  (Cocaine) 1 - Age of First Use:  (18) 1 - Amount (size/oz):  (7-8 oz ) 1 - Frequency:  (daily for the past 3 weeks) 1 - Duration:  (on-going use since recent relapse about 3 weeks ago) 1 - Last Use / Amount:  (11/02/14- 5 grams)  CIWA: CIWA-Ar BP: (!) 96/56 mmHg Pulse Rate: 80 Nausea and Vomiting:  no nausea and no vomiting Tactile Disturbances: none Tremor: no tremor Auditory Disturbances: not present Paroxysmal Sweats: no sweat visible Visual Disturbances: not present Anxiety: no anxiety, at ease Headache, Fullness in Head: none present Agitation: normal activity Orientation and Clouding of Sensorium: oriented and can do serial additions CIWA-Ar Total: 0 COWS:    PATIENT STRENGTHS: (choose at least two) Ability for insight Motivation for treatment/growth  Allergies: No Known Allergies  Home Medications:  Medications Prior to Admission  Medication Sig Dispense Refill  . ARIPiprazole (ABILIFY) 10 MG tablet Take 10 mg by mouth at bedtime.    Marland Kitchen aspirin EC 81 MG tablet Take 81 mg by mouth daily.    Marland Kitchen  atorvastatin (LIPITOR) 20 MG tablet Take 1 tablet (20 mg total) by mouth daily.    . benazepril-hydrochlorthiazide (LOTENSIN HCT) 20-25 MG per tablet Take 1 tablet by mouth daily.    Marland Kitchen. buPROPion (WELLBUTRIN XL) 300 MG 24 hr tablet Take 300 mg by mouth daily.    . clopidogrel (PLAVIX) 75 MG tablet Take 1 tablet (75 mg total) by mouth daily. 30 tablet 6  . diazepam (VALIUM) 5 MG tablet Take 5 mg by mouth 3 (three) times daily.    Marland Kitchen. doxepin (SINEQUAN) 150 MG capsule Take 1 capsule (150 mg total) by mouth at bedtime. 30 capsule 0  . famotidine (PEPCID) 20 MG tablet Take 1 tablet (20 mg total) by mouth 2 (two) times daily.    Marland Kitchen. FLUoxetine (PROZAC) 40 MG capsule Take 1 capsule (40 mg total) by mouth daily. 30 capsule 0  . meloxicam (MOBIC) 15 MG tablet Take 1 tablet (15 mg total) by mouth daily.    . Omega-3 Fatty Acids (FISH OIL) 1000 MG CAPS Take 1 capsule (1,000 mg total) by mouth 2 (two) times daily.  0  . tiZANidine (ZANAFLEX) 4 MG tablet Take 1 tablet (4 mg total) by mouth at bedtime. 30 tablet 0  . [DISCONTINUED] carvedilol (COREG) 25 MG tablet Take 1 tablet (25 mg total) by mouth 2 (two) times daily.    Marland Kitchen. albuterol (PROAIR HFA) 108 (90 BASE) MCG/ACT inhaler Inhale 1 puff into the  lungs every 6 (six) hours as needed. (Patient taking differently: Inhale 1 puff into the lungs every 6 (six) hours as needed for wheezing or shortness of breath. )    . ARIPiprazole (ABILIFY) 5 MG tablet Take 1 tablet (5 mg total) by mouth at bedtime. (Patient not taking: Reported on 11/04/2014) 30 tablet 0  . aspirin 81 MG tablet Take 1 tablet (81 mg total) by mouth at bedtime. (Patient not taking: Reported on 11/04/2014) 30 tablet   . BuPROPion HCl ER, XL, 450 MG TB24 Take 450 mg by mouth daily. (Patient not taking: Reported on 11/04/2014) 30 tablet 0  . nitroGLYCERIN (NITROSTAT) 0.4 MG SL tablet Place 1 tablet (0.4 mg total) under the tongue every 5 (five) minutes as needed. 25 tablet 3    OB/GYN Status:  No LMP for male patient.  [facility]  [facility] of mood and thought processes.    Bonnetta BarryShelly Eisbach PMH-NP   Case discussed with me as above

## 2014-11-08 NOTE — Progress Notes (Signed)
[facility] PROGRESS NOTE  Angel Donovan ZSW:[id number] DOB: 24-Nov-1965 DOA: 11/04/2014 PCP: Toma Deiters, MD  Assessment/Plan: Suicidal ideations/ attempt D/c to [facility]. Further plan per psych    Cellulitis of bilateral upper extremity On empiric clindamycin until 12/24. improving  Right brachial vein DVT Non occlusive DVT seen on [place]. Needs anticoagulation for 3 months. Started on xarelto.  NSTEMI  secondary to cocaine use. Asymptomatic. continue home meds. Resume coreg at lower doser and titrate   HPI/Subjective: Has some pain over left forearm, no suicidal thoughts  Objective: Filed Vitals:   11/08/14 0634  BP: 96/63  Pulse: 75  Temp: 97.6 F (36.4 C)  Resp: 18    Intake/Output Summary (Last 24 hours) at 11/08/14 1326 Last data filed at 11/07/14 2018  Gross per 24 hour  Intake    770 ml  Output   1775 ml  Net  -1005 ml   Filed Weights   11/04/14 1322 11/04/14 1632  Weight: 113.399 kg (250 lb) 93.441 kg (206 lb)    Exam:  Gen:no acute distress  Cardiovascular: normal S1 and S2, no murmurs  Chest: Clear to auscultation bilaterally,   Extremities: Warm, no edema, cellulitis over b/l forearm with needle marks and swelling with induration ,minimal tenderness on left  Data Reviewed: Basic Metabolic Panel:  Recent Labs Lab 11/04/14 1337 11/05/14 0400  NA 141 144  K 4.1 3.9  CL 106 108  CO2 24 25  GLUCOSE 114* 94  BUN 7 8  CREATININE 0.99 1.12  CALCIUM 8.7 8.5   Liver Function Tests:  Recent Labs Lab 11/04/14 1337 11/05/14 0400  AST 18 13  ALT 13 11  ALKPHOS 68 54  BILITOT 0.2* 0.3  PROT 6.9 5.7*  ALBUMIN 3.2* 2.6*   No results for input(s): LIPASE, AMYLASE in the last 168 hours. No results for input(s): AMMONIA in the last 168 hours. CBC:  Recent Labs Lab 11/04/14 1337 11/05/14 0400 11/07/14 0540  WBC 7.8 6.2 6.4  NEUTROABS 5.5  --   --   HGB 11.8* 11.1* 12.9*  HCT 36.6* 35.2* 41.5  MCV 93.1 94.4 95.4  PLT 258  246 228   Cardiac Enzymes:  Recent Labs Lab 11/04/14 1337 11/04/14 1740 11/04/14 2241 11/05/14 0400  TROPONINI 0.46* 0.65* 0.53* <0.30   BNP (last 3 results) No results for input(s): PROBNP in the last 8760 hours. CBG: No results for input(s): GLUCAP in the last 168 hours.  Recent Results (from the past 240 hour(s))  Culture, blood (routine x 2)     Status: None (Preliminary result)   Collection Time: 11/04/14  1:37 PM  Result Value Ref Range Status   Specimen Description BLOOD RIGHT HAND  Final   Special Requests BOTTLES DRAWN AEROBIC AND ANAEROBIC 6CC  Final   Culture NO GROWTH 3 DAYS  Final   Report Status PENDING  Incomplete  Culture, blood (routine x 2)     Status: None (Preliminary result)   Collection Time: 11/04/14  5:40 PM  Result Value Ref Range Status   Specimen Description BLOOD RIGHT ARM  Final   Special Requests BOTTLES DRAWN AEROBIC AND ANAEROBIC 8CC  Final   Culture NO GROWTH 3 DAYS  Final   Report Status PENDING  Incomplete     Studies: No results found.  Scheduled Meds: Continuous Infusions:    Time spent: 20 minutes    Eddie North  [facility] Pager [phone] If 7PM-7AM, please contact night-coverage at [email or web address], password [facility] 11/08/2014, 1:26 PM  LOS: 4 days

## 2014-11-08 NOTE — Telephone Encounter (Addendum)
Patient admitted for suicidal ideations. Spoke with the NP who tells me that she has already discussed with hospitalist -- order for stopping ASA, taking Plavix, starting Xarelto.  She explains that they have instructed him to take Xarelto for 3 months. [facility]  Will forward to Dr. [name] for review.

## 2014-11-08 NOTE — Clinical Social Work Note (Signed)
[facility] CSW will sign off.   Derenda FennelKara Yessenia Maillet, [place] [phone]

## 2014-11-08 NOTE — Care Management Utilization Note (Signed)
UR complete 

## 2014-11-08 NOTE — Telephone Encounter (Signed)
PT WENT TO [facility] ED FOR SUICIDE ATTEMPT, NOTED TO HAVE DVT R BRACHIAL VEIN, ON PLAVIX AND ASA WANTS TO KNOW IF SHOULD BE ON XARELTO AS WEL, PLS CALL

## 2014-11-08 NOTE — Progress Notes (Signed)
D: Patient was seen watching T.V. Appear depressed. Patient endorses depression and sadness but denies SI, AH/VH. Attended AA group. Remains cooperative on the unit. A: Support and encouragement given to the patient. Encouraged patient to adhere with the treatment plan and complied with the medication. Patient encourages to verbalize needs to staff. Will continue to monitor patient. R: Patient receptive. Accepted to his medications.

## 2014-11-09 DIAGNOSIS — F1021 Alcohol dependence, in remission: Secondary | ICD-10-CM

## 2014-11-09 DIAGNOSIS — F1490 Cocaine use, unspecified, uncomplicated: Secondary | ICD-10-CM

## 2014-11-09 DIAGNOSIS — F332 Major depressive disorder, recurrent severe without psychotic features: Principal | ICD-10-CM

## 2014-11-09 DIAGNOSIS — F1414 Cocaine abuse with cocaine-induced mood disorder: Secondary | ICD-10-CM | POA: Diagnosis present

## 2014-11-09 LAB — CULTURE, BLOOD (ROUTINE X 2)
CULTURE: NO GROWTH
Culture: NO GROWTH

## 2014-11-09 NOTE — [facility] Suicide Risk Assessment (Signed)
Suicide Risk Assessment  Admission Assessment     Nursing information obtained from:  Patient, Review of record Demographic factors:  Male, Caucasian, Unemployed, Access to firearms Current Mental Status:  Self-harm thoughts, Self-harm behaviors Loss Factors:  Decline in physical health Historical Factors:  Prior suicide attempts, Family history of suicide, Impulsivity Risk Reduction Factors:  Sense of responsibility to family, Living with another person, especially a relative, Positive social support Total Time spent with patient: 45 minutes  CLINICAL FACTORS:   Depression:   Comorbid alcohol abuse/dependence Impulsivity Alcohol/Substance Abuse/Dependencies Medical Diagnoses and Treatments/Surgeries  Psychiatric Specialty Exam:     Blood pressure 95/70, pulse 95, temperature 97.6 F (36.4 C), temperature source Oral, resp. rate 18, height 5\' 10"  (1.778 m), weight 92.987 kg (205 lb), SpO2 97 %.Body mass index is 29.41 kg/(m^2).  General Appearance: Fairly Groomed  Patent attorneyye Contact::  Fair  Speech:  Clear and Coherent and Slow  Volume:  Decreased  Mood:  Anxious and Depressed  Affect:  Depressed and Tearful  Thought Process:  Coherent and Goal Directed  Orientation:  Full (Time, Place, and Person)  Thought Content:  events symptoms worries concerns  Suicidal Thoughts:  No  Homicidal Thoughts:  No  Memory:  Immediate;   Fair Recent;   Fair Remote;   Fair  Judgement:  Fair  Insight:  Present  Psychomotor Activity:  Decreased  Concentration:  Fair  Recall:  FiservFair  Fund of Knowledge:Fair  Language: Fair  Akathisia:  No  Handed:    AIMS (if indicated):     Assets:  Desire for Improvement Housing Social Support  Sleep:  Number of Hours: 6   Musculoskeletal: Strength & Muscle Tone: within normal limits Gait & Station: normal Patient leans: N/A  COGNITIVE FEATURES THAT CONTRIBUTE TO RISK:  Closed-mindedness Polarized thinking Thought constriction (tunnel vision)     SUICIDE RISK:   Moderate:  Frequent suicidal ideation with limited intensity, and duration, some specificity in terms of plans, no associated intent, good self-control, limited dysphoria/symptomatology, some risk factors present, and identifiable protective factors, including available and accessible social support.  PLAN OF CARE: Supportive approach/coping skills/relapse prevention                               Pursue detox as needed                               Depression: pursue the Abilify and the Prozac and Wellbutrin                                                    Reassess the necessity to also take Doxepin at night                                                    CBT;midfullness                                Follow up internal medicine recommendations I certify that inpatient services furnished can reasonably be expected to improve  the patient's condition.  Desarae Placide A 11/09/2014, 6:00 PM

## 2014-11-09 NOTE — Progress Notes (Signed)
Adult Psychoeducational Group Note  Date:  11/09/2014 Time:  6:27 PM  Group Topic/Focus: Music Activity   Participation Level:  Active  Participation Quality:  Appropriate  Affect:  Appropriate  Cognitive:  Appropriate  Insight: Appropriate  Engagement in Group:  Engaged  Modes of Intervention:  Activity  Additional Comments:

## 2014-11-09 NOTE — Progress Notes (Signed)
[facility] given [facility].

## 2014-11-09 NOTE — Progress Notes (Signed)
D: Patient in the dayroom sitting quietly on approach.  Patient states he had a good day.  Patient states he is still depressed.  Patient states he has a lot to live for.  Patient states he has family that loves him. Patient states, "I just want to get better and straightened out."  Patient states   Patient denies SI/HI and denies AVH. A: Staff to monitor Q 15 mins for safety.  Encouragement and support offered.  Scheduled medications administered per orders. R: Patient remains safe on the unit.  Patient attended group tonight.  Patient visible on the unit.  Patient taking  administered medications.

## 2014-11-09 NOTE — H&P (Signed)
Psychiatric Admission Assessment Adult  Patient Identification:  Angel JewelDennis L Macdowell Date of Evaluation:  11/09/2014 Chief Complaint:  MDD,RECURRENT SEVERE History of Present Illness:: Caucasian male, 48 years old was evaluated for suicidal thoughts and ideation.  Patient states that he has been using cocaine to "stop his heart" but at the same time he wanted it to look like accidental over dose.  Patient stated that he has been using cocaine heavily in the last three weeks.  He reported poor grieving since his father passed away this past February.  Partient stated that spending the holidays without his father caused him more depression.  He rated his depression 9/10 with 10 being severe depression.  He also felt disappointed that he relapsed on Cocaine and started using again.  Patient usually sees a provider and [facility] but have not been attending his therapy session because he relapsed on Cocaine.  Patient reports good sleep but poor appetite.  He reports feeling hopeless, worthless and helpless since he relapsed on Cocine.   He has been sober from alcohol for 15 years now.  Patient is disabled  And live off his disability check.  He is married and live with his wife.  He now denies SI/HI/AVH but he hasa previous hx of attempt by cutting his wrist.  He is already resumed all of his home medications.  Patient is encouraged to participate in groups. Elements:  Location:  Major depressive disordert, recurrent severe, Cocaine abuse. Quality:  Suicidal thoughts and had plans to to use Cocaine to stop his heart, feels hopeless and helpless., complicated grieving-death of father in Feb. Severity:  severe. Duration:  3 weeks. Context:  Need help geeting off Cocaine. Associated Signs/Synptoms: Depression Symptoms:  depressed mood, anhedonia, hypersomnia, feelings of worthlessness/guilt, suicidal thoughts with specific plan, suicidal attempt, (Hypo) Manic Symptoms:   Anxiety Symptoms:    Psychotic Symptoms:   PTSD Symptoms:  Total Time spent with patient: 1 hour  Psychiatric Specialty Exam: Physical Exam  ROS  Blood pressure 95/70, pulse 95, temperature 97.6 F (36.4 C), temperature source Oral, resp. rate 18, height 5\' 10"  (1.778 m), weight 92.987 kg (205 lb), SpO2 97 %.Body mass index is 29.41 kg/(m^2).  General Appearance: Casual  Eye Contact::  Good  Speech:  Clear and Coherent and Normal Rate  Volume:  Normal  Mood:  Depressed  Affect:  Congruent, Depressed and Flat  Thought Process:  Coherent, Goal Directed and Intact  Orientation:  Full (Time, Place, and Person)  Thought Content:  WDL  Suicidal Thoughts:  No  Homicidal Thoughts:  No  Memory:  Immediate;   Good Recent;   Good Remote;   Good  Judgement:  Fair  Insight:  Good and Fair  Psychomotor Activity:  Normal  Concentration:  Good  Recall:  NA  Fund of Knowledge:Fair  Language: Good  Akathisia:  NA  Handed:  Right  AIMS (if indicated):     Assets:  Desire for Improvement  Sleep:  Number of Hours: 6    Musculoskeletal: Strength & Muscle Tone: within normal limits Gait & Station: normal Patient leans: N/A  Past Psychiatric History: Diagnosis:  Hospitalizations: Yes, [facility]  Outpatient Care: [facility]  Substance Abuse Care: yes  Self-Mutilation: Cut wrist 2000  Suicidal Attempts: 2000  Violent Behaviors: DENIES   Past Medical History:   Past Medical History  Diagnosis Date  . Coronary artery disease     Inferior MI November, 2011, bare-metal stent large circumflex, moderate LAD disease  /  catheterization June, 2012 LAD unchanged, moderate in-stent restenosis circumflex, improved LV function, EF 45%, medical therapy  . Ejection fraction < 50%     EF 30% echo November, 2011 (MI)  / EF 40%, echo, months after MI  /  EF 45%, cardiac catheterization, June, 201 to  . CHF (congestive heart failure)     Initially post MI, then improved.  . Depression   . Hepatitis C   . Hypertension   .  History of cocaine abuse     in 80's, ex-IVDU  . History of alcohol abuse   . Pulmonary nodule   . Anal fissure   . Dizziness     November, 2013  . Carotid artery disease     Doppler, June, 2014, 1-39% bilateral  . Asthma   . Sciatica    None. Allergies:  No Known Allergies PTA Medications: Prescriptions prior to admission  Medication Sig Dispense Refill Last Dose  . albuterol (PROAIR HFA) 108 (90 BASE) MCG/ACT inhaler Inhale 1 puff into the lungs every 6 (six) hours as needed. (Patient taking differently: Inhale 1 puff into the lungs every 6 (six) hours as needed for wheezing or shortness of breath. )   unknown  . ARIPiprazole (ABILIFY) 10 MG tablet Take 10 mg by mouth at bedtime.   11/03/2014 at Unknown time  . atorvastatin (LIPITOR) 20 MG tablet Take 1 tablet (20 mg total) by mouth daily.   11/03/2014 at Unknown time  . benazepril-hydrochlorthiazide (LOTENSIN HCT) 20-25 MG per tablet Take 1 tablet by mouth daily.   11/04/2014 at Unknown time  . buPROPion (WELLBUTRIN XL) 300 MG 24 hr tablet Take 300 mg by mouth daily.   11/04/2014 at Unknown time  . carvedilol (COREG) 6.25 MG tablet Take 1 tablet (6.25 mg total) by mouth 2 (two) times daily. 60 tablet 0   . clindamycin (CLEOCIN) 300 MG capsule Take 2 capsules (600 mg total) by mouth 3 (three) times daily. 48 capsule 0   . clopidogrel (PLAVIX) 75 MG tablet Take 1 tablet (75 mg total) by mouth daily. 30 tablet 6 11/04/2014 at Unknown time  . doxepin (SINEQUAN) 150 MG capsule Take 1 capsule (150 mg total) by mouth at bedtime. 30 capsule 0 11/03/2014 at Unknown time  . famotidine (PEPCID) 20 MG tablet Take 1 tablet (20 mg total) by mouth 2 (two) times daily.   11/04/2014 at Unknown time  . FLUoxetine (PROZAC) 40 MG capsule Take 1 capsule (40 mg total) by mouth daily. 30 capsule 0 11/04/2014 at Unknown time  . meloxicam (MOBIC) 15 MG tablet Take 1 tablet (15 mg total) by mouth daily.   11/03/2014 at Unknown time  . nicotine (NICODERM CQ -  DOSED IN MG/24 HOURS) 14 mg/24hr patch Place 1 patch (14 mg total) onto the skin daily. 28 patch 0   . nitroGLYCERIN (NITROSTAT) 0.4 MG SL tablet Place 1 tablet (0.4 mg total) under the tongue every 5 (five) minutes as needed. 25 tablet 3 unknown  . Omega-3 Fatty Acids (FISH OIL) 1000 MG CAPS Take 1 capsule (1,000 mg total) by mouth 2 (two) times daily.  0 11/04/2014 at Unknown time  . oxyCODONE (OXY IR/ROXICODONE) 5 MG immediate release tablet Take 1 tablet (5 mg total) by mouth every 6 (six) hours as needed for moderate pain. 20 tablet 0   . Rivaroxaban (XARELTO STARTER PACK) 15 & 20 MG TBPK Take as directed on package: Start with one 15mg  tablet by mouth twice a day with food. On Day  22, switch to one 20mg  tablet once a day with food. 51 each 0   . Rivaroxaban (XARELTO) 15 MG TABS tablet Take 1 tablet (15 mg total) by mouth 2 (two) times daily with a meal. 60 tablet 0   . tiZANidine (ZANAFLEX) 4 MG tablet Take 1 tablet (4 mg total) by mouth at bedtime. 30 tablet 0 11/03/2014 at Unknown time    Previous Psychotropic Medications:  Medication/Dose                 Substance Abuse History in the last 12 months:  Yes.    Consequences of Substance Abuse: NA  Social History:  reports that he quit smoking about 4 years ago. His smoking use included Cigarettes. He has a 35 pack-year smoking history. He has never used smokeless tobacco. He reports that he uses illicit drugs (Cocaine). He reports that he does not drink alcohol. Additional Social History: History of alcohol / drug use?: Yes Name of Substance 1: cocaine 1 - Age of First Use: unknown 1 - Amount (size/oz): just during    Current Place of Residence:   Place of Birth:   Family Members: Marital Status:  Married Children:  Sons:  Daughters: Relationships: Education:  8th grade Educational Problems/Performance: Religious Beliefs/Practices: History of Abuse (Emotional/Phsycial/Sexual) Occupational Experiences; Military  History:  None. Legal History: Hobbies/Interests:  Family History:   Family History  Problem Relation Age of Onset  . Coronary artery disease Father   . Cancer Father     lymphoma  . Cancer - Colon Father     Results for orders placed or performed during the hospital encounter of 11/04/14 (from the past 72 hour(s))  CBC     Status: Abnormal   Collection Time: 11/07/14  5:40 AM  Result Value Ref Range   WBC 6.4 4.0 - 10.5 K/uL   RBC 4.35 4.22 - 5.81 MIL/uL   Hemoglobin 12.9 (L) 13.0 - 17.0 g/dL   HCT 16.141.5 09.639.0 - 04.552.0 %   MCV 95.4 78.0 - 100.0 fL   MCH 29.7 26.0 - 34.0 pg   MCHC 31.1 30.0 - 36.0 g/dL   RDW 40.914.5 81.111.5 - 91.415.5 %   Platelets 228 150 - 400 K/uL   Psychological Evaluations:  Assessment:   DSM5: Major Depression, Recurrent severe, Substance Abuse and Substance Induced Mood Disorder  Schizophrenia Disorders:   Obsessive-Compulsive Disorders:   Trauma-Stressor Disorders:   Substance/Addictive Disorders:  Cocaine Abuse, Alcohol dependence in Remission Depressive Disorders:  Major Depressive Disorder - Severe (296.23)   Past Medical History  Diagnosis Date  . 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 Ejection fraction < 50%     EF 30% echo November, 2011 (MI)  / EF 40%, echo, months after MI  /  EF 45%, cardiac catheterization, June, 201 to  . CHF (congestive heart failure)     Initially post MI, then improved.  . Depression   . Hepatitis C   . Hypertension   . History of cocaine abuse     in 80's, ex-IVDU  . History of alcohol abuse   . Pulmonary nodule   . Anal fissure   . Dizziness     November, 2013  . Carotid artery disease     Doppler, June, 2014, 1-39% bilateral  . Asthma   .  Sciatica      Treatment Plan/Recommendations:   Admit for crisis  management/stabilization. Review and reinstate any pertinent home medications for other health issues.   Medication management to treat current mood problems. Continue taking your Prozac 40 mg by mouth daily for deopression Aripiprazole 10 mg by mouth at bed time for mood Sinequan 150 mg by mouth at bed time for sleep/mood Wellbutrin xr 300 mg by mouth daily for depression Group counseling sessions and activities. Primary care consults as needed. Continue current treatment plan .  Treatment Plan Summary: Daily contact with patient to assess and evaluate symptoms and progress in treatment Medication management Current Medications:  Current Facility-Administered Medications  Medication Dose Route Frequency Provider Last Rate Last Dose  . acetaminophen (TYLENOL) tablet 650 mg  650 mg Oral Q6H PRN Bonnetta Barry, NP      . albuterol (PROVENTIL HFA;VENTOLIN HFA) 108 (90 BASE) MCG/ACT inhaler 1 puff  1 puff Inhalation Q4H PRN Velna Hatchet May Agustin, NP      . alum & mag hydroxide-simeth (MAALOX/MYLANTA) 200-200-20 MG/5ML suspension 30 mL  30 mL Oral Q4H PRN Bonnetta Barry, NP      . ARIPiprazole (ABILIFY) tablet 10 mg  10 mg Oral Daily Lindwood Qua, NP   10 mg at 11/09/14 0827  . atorvastatin (LIPITOR) tablet 20 mg  20 mg Oral q1800 Lindwood Qua, NP   20 mg at 11/08/14 1800  . benazepril (LOTENSIN) tablet 20 mg  20 mg Oral Daily Lindwood Qua, NP   20 mg at 11/09/14 0827  . buPROPion (WELLBUTRIN XL) 24 hr tablet 300 mg  300 mg Oral Daily Lindwood Qua, NP   300 mg at 11/09/14 1610  . clindamycin (CLEOCIN) capsule 600 mg  600 mg Oral 3 times per day Lindwood Qua, NP   600 mg at 11/09/14 9604  . clopidogrel (PLAVIX) tablet 75 mg  75 mg Oral Daily Lindwood Qua, NP   75 mg at 11/09/14 0827  . doxepin (SINEQUAN) capsule 150 mg  150 mg Oral QHS Lindwood Qua, NP   150 mg at 11/08/14 2132  . FLUoxetine (PROZAC) capsule 40 mg  40 mg Oral Daily Lindwood Qua, NP    40 mg at 11/09/14 0827  . folic acid (FOLVITE) tablet 1 mg  1 mg Oral Daily Velna Hatchet May Agustin, NP   1 mg at 11/09/14 5409  . hydrochlorothiazide (HYDRODIURIL) tablet 25 mg  25 mg Oral Daily Lindwood Qua, NP   25 mg at 11/09/14 8119  . magnesium hydroxide (MILK OF MAGNESIA) suspension 30 mL  30 mL Oral Daily PRN Bonnetta Barry, NP      . meloxicam (MOBIC) tablet 15 mg  15 mg Oral Daily Lindwood Qua, NP   15 mg at 11/09/14 0827  . multivitamin with minerals tablet 1 tablet  1 tablet Oral Daily Lindwood Qua, NP   1 tablet at 11/09/14 0827  . Rivaroxaban (XARELTO) tablet 15 mg  15 mg Oral BID Lindwood Qua, NP   15 mg at 11/09/14 0827  . senna (SENOKOT) tablet 8.6 mg  1 tablet Oral BID Lindwood Qua, NP   8.6 mg at 11/09/14 1478  . thiamine (VITAMIN B-1) tablet 100 mg  100 mg Oral Daily Lindwood Qua, NP   100 mg at 11/09/14 2956    Observation Level/Precautions:  15 minute checks  Laboratory:  CBC Chemistry Profile UDS Alcohol level  Psychotherapy:  Encourage to participate in  group  Medications:  See above  Consultations:  As needed  Discharge Concerns:  Relapse, medication and treatment non compliance  Estimated LOS: 2-5 day  Other:     I certify that inpatient services furnished can reasonably be expected to improve the patient's condition.   Dahlia Byes, C   PMHNP-BC 12/19/201510:57 AM  I personally assessed the patient, reviewed the physical exam and labs and formulated the treatment plan Madie Reno A. Dub Mikes, M.D.

## 2014-11-09 NOTE — [facility] Group Notes (Signed)
[facility] LCSW Group Therapy Note  11/09/2014 1:15 PM   Type of Therapy and Topic:  Group Therapy: Avoiding Self-Sabotaging and Enabling Behaviors  Participation Level:  Active  Mood: Depressed  Description of Group:     Learn how to identify obstacles, self-sabotaging and enabling behaviors, what are they, why do we do them and what needs do these behaviors meet? Discuss unhealthy relationships and how to have positive healthy boundaries with those that sabotage and enable. Explore aspects of self-sabotage and enabling in yourself and how to limit these self-destructive behaviors in everyday life. A scaling question is used to help patient look at where they are now in their motivation to change, from 1 to 10 (lowest to highest motivation).  Therapeutic Goals: 1. Patient will identify one obstacle that relates to self-sabotage and enabling behaviors 2. Patient will identify one personal self-sabotaging or enabling behavior they did prior to admission 3. Patient able to establish a plan to change the above identified behavior they did prior to admission:  4. Patient will demonstrate ability to communicate their needs through discussion and/or role plays.   Summary of Patient Progress:  Summary of Progress/Problems: The main focus of today's process group was for the patient to identify ways in which they have in the past sabotaged their own recovery. Motivational Interviewing was utilized to ask the group members what they get out of their substance use, isolation, noncompliance, self harm, negative self talk, etc and what reasons they may have for wanting to change. The Stages of Change were explained using a handout, and patients identified where they currently are with regard to stages of change. The patient expressed that he self sabotages by isolation and lack of compliance with follow up appointments. Patient was able to process and reports operating on some vague idea that ''isolation and  noncompliance is easier yet beginning to see that it actually harder and more exhausting." He reports he is in Determination phase and reports a willingness to walk through the discomfort in order to get to a better place. Maurine MinisterDennis shared during the warm up that he looks forward to spending more time w his two grandchildren in 2016 and agreed that this can be a motivating factor for him.  Therapeutic Modalities:   Cognitive Behavioral Therapy Person-Centered Therapy Motivational Interviewing   Carney Bernatherine C Harrill, LCSW

## 2014-11-09 NOTE — Progress Notes (Signed)
D) Pt has been attendi. Affect is flat and mood depressed. Pt rates his depression at a 9, hopelessness at an 8 and his anxiety at an 8. Does not volunteer information and when asked how he is doing does not elaborate. Pt states he feels hopelessess and negative. States his arms hurt and he is feeling uncomfortable. Has no idea where he is going to go when he leaves here.  A) Given support and provided with active listening. Encouragement given. R) Pt denies SI and HI

## 2014-11-10 DIAGNOSIS — Z634 Disappearance and death of family member: Secondary | ICD-10-CM

## 2014-11-10 DIAGNOSIS — F1994 Other psychoactive substance use, unspecified with psychoactive substance-induced mood disorder: Secondary | ICD-10-CM

## 2014-11-10 NOTE — Progress Notes (Signed)
[facility] MD Progress Note  11/10/2014 3:26 PM Angel Donovan  MRN:  [id number] Subjective:  Angel Donovan is looking back at what he did trying to OD on the cocaine and states he has been having a hard time dealing with his father's death. States that he dedicated the last years to taking care of him. Once he died he was left with a huge void. Realizes he was discounting the relationships with his wife and children and that they should always have been as important as the relationship with his father. States he still goes back and forth if he did enough to try to keep him alive.  Diagnosis:   DSM5: Substance/Addictive Disorders:  Cocaine Use Disorder severe Depressive Disorders:  Major Depressive Disorder - Severe (296.23) Total Time spent with patient: 30 minutes  Axis I: Bereavement and Substance Induced Mood Disorder  ADL's:  Intact  Sleep: Fair  Appetite:  Poor  Psychiatric Specialty Exam: Physical Exam  Review of Systems  Constitutional: Positive for malaise/fatigue.  HENT: Negative.   Eyes: Negative.   Respiratory: Negative.   Cardiovascular: Negative.   Gastrointestinal: Negative.   Genitourinary: Negative.   Musculoskeletal: Negative.   Skin: Negative.   Neurological: Positive for weakness.  Endo/Heme/Allergies: Negative.   Psychiatric/Behavioral: Positive for depression and substance abuse. The patient is nervous/anxious.     Blood pressure 121/66, pulse 57, temperature 97.8 F (36.6 C), temperature source Axillary, resp. rate 18, height 5\' 10"  (1.778 m), weight 92.987 kg (205 lb), SpO2 97 %.Body mass index is 29.41 kg/(m^2).  General Appearance: Fairly Groomed  Patent attorney::  Fair  Speech:  Clear and Coherent, Slow and not spontaneous  Volume:  Decreased  Mood:  Anxious and Depressed  Affect:  Depressed and Tearful  Thought Process:  Coherent and Goal Directed  Orientation:  Full (Time, Place, and Person)  Thought Content:  events, death of his father, not being able to  move on  Suicidal Thoughts:  No  Homicidal Thoughts:  No  Memory:  Immediate;   Fair Recent;   Fair Remote;   Fair  Judgement:  Fair  Insight:  Present  Psychomotor Activity:  Decreased  Concentration:  Fair  Recall:  [employer] of Knowledge:Fair  Language: Fair  Akathisia:  No  Handed:    AIMS (if indicated):     Assets:  Desire for Improvement Housing Social Support  Sleep:  Number of Hours: 6.5   Musculoskeletal: Strength & Muscle Tone: within normal limits Gait & Station: normal Patient leans: N/A  Current Medications: Current Facility-Administered Medications  Medication Dose Route Frequency Provider Last Rate Last Dose  . acetaminophen (TYLENOL) tablet 650 mg  650 mg Oral Q6H PRN Bonnetta Barry, NP      . albuterol (PROVENTIL HFA;VENTOLIN HFA) 108 (90 BASE) MCG/ACT inhaler 1 puff  1 puff Inhalation Q4H PRN Velna Hatchet May Agustin, NP      . alum & mag hydroxide-simeth (MAALOX/MYLANTA) 200-200-20 MG/5ML suspension 30 mL  30 mL Oral Q4H PRN Bonnetta Barry, NP      . ARIPiprazole (ABILIFY) tablet 10 mg  10 mg Oral Daily Lindwood Qua, NP   10 mg at 11/10/14 0801  . atorvastatin (LIPITOR) tablet 20 mg  20 mg Oral q1800 Lindwood Qua, NP   20 mg at 11/09/14 1725  . benazepril (LOTENSIN) tablet 20 mg  20 mg Oral Daily Lindwood Qua, NP   20 mg at 11/10/14 0800  . buPROPion (WELLBUTRIN XL) 24 hr tablet 300 mg  300 mg Oral Daily Lindwood QuaSheila May Agustin, NP   300 mg at 11/10/14 0800  . clindamycin (CLEOCIN) capsule 600 mg  600 mg Oral 3 times per day Lindwood QuaSheila May Agustin, NP   600 mg at 11/10/14 16100618  . clopidogrel (PLAVIX) tablet 75 mg  75 mg Oral Daily Lindwood QuaSheila May Agustin, NP   75 mg at 11/10/14 0800  . doxepin (SINEQUAN) capsule 150 mg  150 mg Oral QHS Lindwood QuaSheila May Agustin, NP   150 mg at 11/09/14 2143  . FLUoxetine (PROZAC) capsule 40 mg  40 mg Oral Daily Lindwood QuaSheila May Agustin, NP   40 mg at 11/10/14 0801  . folic acid (FOLVITE) tablet 1 mg  1 mg Oral Daily Velna HatchetSheila May Agustin,  NP   1 mg at 11/10/14 0801  . hydrochlorothiazide (HYDRODIURIL) tablet 25 mg  25 mg Oral Daily Lindwood QuaSheila May Agustin, NP   25 mg at 11/10/14 0801  . magnesium hydroxide (MILK OF MAGNESIA) suspension 30 mL  30 mL Oral Daily PRN Bonnetta BarryShelly Eisbach, NP      . meloxicam (MOBIC) tablet 15 mg  15 mg Oral Daily Lindwood QuaSheila May Agustin, NP   15 mg at 11/10/14 0801  . multivitamin with minerals tablet 1 tablet  1 tablet Oral Daily Lindwood QuaSheila May Agustin, NP   1 tablet at 11/10/14 0800  . Rivaroxaban (XARELTO) tablet 15 mg  15 mg Oral BID Lindwood QuaSheila May Agustin, NP   15 mg at 11/10/14 0800  . senna (SENOKOT) tablet 8.6 mg  1 tablet Oral BID Lindwood QuaSheila May Agustin, NP   8.6 mg at 11/10/14 0800  . thiamine (VITAMIN B-1) tablet 100 mg  100 mg Oral Daily Sheila May Agustin, NP   100 mg at 11/10/14 0800    Lab Results: No results found for this or any previous visit (from the past 48 hour(s)).  Physical Findings: AIMS: Facial and Oral Movements Muscles of Facial Expression: None, normal Lips and Perioral Area: None, normal Jaw: None, normal Tongue: None, normal,Extremity Movements Upper (arms, wrists, hands, fingers): None, normal Lower (legs, knees, ankles, toes): None, normal, Trunk Movements Neck, shoulders, hips: None, normal, Overall Severity Severity of abnormal movements (highest score from questions above): None, normal Incapacitation due to abnormal movements: None, normal Patient's awareness of abnormal movements (rate only patient's report): No Awareness, Dental Status Current problems with teeth and/or dentures?: No Does patient usually wear dentures?: No  CIWA:  CIWA-Ar Total: 0 COWS:     Treatment Plan Summary: Daily contact with patient to assess and evaluate symptoms and progress in treatment Medication management  Plan: Supportive approach/coping skills/grief and loss           Depression: he is already on Wellbutrin and Prozac. More recently his Abilify was increased to 10 mg. He is also on Doxepin 15  mg for sleep (states that if he does not take the Doxepin he cant sleep) He is hesitant to come off any of his medications as afraid the depression would get worst. He is still actively grieving the death of his father. Some of the worsening of his depression could have been substance induced when he tried to OD on cocaine           Will monitor his mood and reassess if medications need to be changed in this setting at this time  Medical Decision Making Problem Points:  Review of psycho-social stressors (1) Data Points:  Review of medication regiment & side effects (2)  I certify that inpatient services furnished  can reasonably be expected to improve the patient's condition.   Talecia Sherlin A 11/10/2014, 3:26 PM

## 2014-11-10 NOTE — [facility] Group Notes (Signed)
[facility] LCSW Group Therapy  11/10/2014 1:55 PM  Type of Therapy:  Group Therapy  Participation Level:  Active  Participation Quality:  Appropriate, Sharing and Supportive  Affect:  Appropriate  Cognitive:  Appropriate and Oriented  Insight:  Developing/Improving, Engaged and Supportive  Engagement in Therapy:  Developing/Improving, Engaged and Supportive  Modes of Intervention:  Discussion, Education, Exploration, Rapport Building and Support  Summary of Progress/Problems: Pt was engaged and listened to others share about self-sabotaging behaviors and supports.  Pt did not share a personal example during group.   Seabron SpatesVaughn, Oswin Johal Anne 11/10/2014, 1:55 PM

## 2014-11-10 NOTE — Progress Notes (Signed)
D) Pt sitting in the dayroom alone this morning and began a conversation about his choices and how trying to kill himself was "selfish. I didn't think about my wife at all. What would she do without me". Pt states that he has children and grandchildren whom he loves and cannot imagine how he could have made a choice to do such a thing. Is glad that he did not succeed. Has attended all the groups and interacts with his peers appropriately. Rates his depression at a 5, hopelessness at a 5 and his anxiety at an 8. Affect is less intense today. A bit more spontaneous A) Given support, reassurance and praise. Encouragement given. Provided with a 1:1 R) Pt states he is feeling better. Denies SI and HI.

## 2014-11-10 NOTE — [facility] Group Notes (Signed)
[facility] Group Notes:  (Nursing/MHT/Case Management/Adjunct)  Date:  11/10/2014  Time:  1030am  Type of Therapy:  Life Skills Group  Participation Level:  Minimal  Participation Quality:  Attentive  Affect:  Depressed and Flat  Cognitive:  Lacking  Insight:  None  Engagement in Group:  Limited  Modes of Intervention:  Discussion, Education and Support  Summary of Progress/Problems: Patient was attentive in group but responded minimally. Pt rated his energy level 5/10 and stated love to him meant having "compassion."  Laurine BlazerGuthrie, GrenadaBrittany A 11/10/2014, 2:38 PM

## 2014-11-10 NOTE — Progress Notes (Signed)
Patient did attend the evening speaker AA meeting.  

## 2014-11-10 NOTE — Progress Notes (Signed)
[facility] Group Notes:  (Nursing/MHT/Case Management/Adjunct)  Date:  11/09/2014 Time:  2320  Type of Therapy:  Psychoeducational Skills  Participation Level:  Active  Participation Quality:  Appropriate  Affect:  Depressed  Cognitive:  Appropriate  Insight:  Appropriate  Engagement in Group:  Developing/Improving  Modes of Intervention:  Education  Summary of Progress/Problems: The patient attended the A. A. Meeting and was appropriate.   Keeghan Bialy S 11/10/2014, 2:12 AM

## 2014-11-10 NOTE — Consult Note (Signed)
Clarified with [facility] that patient was on Plavix and Xarelto.  Contacted N. Dhungel M.D. upon patient's admission, due to patient acute DVT, 3 mos regimen of Xarelto QD is safe with patient's other anti-coag meds.  Will continue to monitor for any s/s of bleeding.  [facility] and discussed with her also.  Dr Dub MikesLugo Pysch notified.  I agree with assessment and plan Madie Renorving A. Dub MikesLugo, M.D.

## 2014-11-10 NOTE — [facility] Group Notes (Signed)
[facility] Group Notes:  (Nursing/MHT/Case Management/Adjunct)  Date:  11/10/2014  Time:  1515pm  Type of Therapy:  Therapeutic Ball Activity  Participation Level:  Active  Participation Quality:  Attentive and Inappropriate  Affect:  Flat  Cognitive:  Alert  Insight:  Lacking  Engagement in Group:  Engaged  Modes of Intervention:  Activity  Summary of Progress/Problems: Patient attended group and participated in activity and answered activity questions inappropriately. Pt responded to activity question prompt of "Favorite scent" by stating "skunkweed." Pt redirected and answered "stetson" cologne. Pt answered "Describe your favorite place" by stating "at my house, in the woods, just me and my house, away from everyone." Pt speech is soft and affect remains flat.  Angel Donovan, Binh Doten A 11/10/2014, 4:05 PM

## 2014-11-11 MED ORDER — BUPROPION HCL ER (XL) 300 MG PO TB24: 300.0000 mg | ORAL_TABLET | Freq: Every day | ORAL | Status: DC

## 2014-11-11 MED ORDER — RIVAROXABAN 15 MG PO TABS: 15.0000 mg | ORAL_TABLET | Freq: Two times a day (BID) | ORAL | Status: DC

## 2014-11-11 MED ORDER — CLINDAMYCIN HCL 300 MG PO CAPS: 600.0000 mg | ORAL_CAPSULE | Freq: Three times a day (TID) | ORAL | Status: DC

## 2014-11-11 MED ORDER — BENAZEPRIL-HYDROCHLOROTHIAZIDE 20-25 MG PO TABS: 1.0000 | ORAL_TABLET | Freq: Every day | ORAL | Status: DC

## 2014-11-11 MED ORDER — MELOXICAM 15 MG PO TABS: 15.0000 mg | ORAL_TABLET | Freq: Every day | ORAL | Status: DC

## 2014-11-11 MED ORDER — ATORVASTATIN CALCIUM 20 MG PO TABS: 20.0000 mg | ORAL_TABLET | Freq: Every day | ORAL | Status: DC

## 2014-11-11 MED ORDER — ARIPIPRAZOLE 10 MG PO TABS: 10.0000 mg | ORAL_TABLET | Freq: Every day | ORAL | Status: DC

## 2014-11-11 MED ORDER — SENNA 8.6 MG PO TABS: 1.0000 | ORAL_TABLET | Freq: Two times a day (BID) | ORAL | Status: DC

## 2014-11-11 MED ORDER — FLUOXETINE HCL 40 MG PO CAPS: 40.0000 mg | ORAL_CAPSULE | Freq: Every day | ORAL | Status: DC

## 2014-11-11 MED ORDER — DOXEPIN HCL 150 MG PO CAPS: 150.0000 mg | ORAL_CAPSULE | Freq: Every day | ORAL | Status: DC

## 2014-11-11 MED ORDER — ALBUTEROL SULFATE HFA 108 (90 BASE) MCG/ACT IN AERS: 1.0000 | INHALATION_SPRAY | Freq: Four times a day (QID) | RESPIRATORY_TRACT | Status: AC | PRN

## 2014-11-11 MED ORDER — CLOPIDOGREL BISULFATE 75 MG PO TABS: 75.0000 mg | ORAL_TABLET | Freq: Every day | ORAL | Status: DC

## 2014-11-11 NOTE — Progress Notes (Signed)
Discharge Note: Discharge instructions/prescriptions/medication samples given to patient. Patient verbalized understanding of discharge instructions and prescriptions. Returned belongings to patient. Denies SI/HI/AVH. Patient d/c without incident to the lobby and transported home with family(niece and nephew).

## 2014-11-11 NOTE — Discharge Summary (Signed)
Physician Discharge Summary Note  Patient:  Angel Donovan is an 48 y.o., male MRN:  [id number] DOB:  May 10, 1966 Patient phone:  [phone] (home)  Patient address:   [address] [place] [id number],  Total Time spent with patient: Greater than 30 minutes  Date of Admission:  11/08/2014  Date of Discharge: 11/11/14  Reason for Admission: Worsening depression needing mood stabilization treatment   Discharge Diagnoses: Active Problems:   Major depressive disorder, recurrent severe without psychotic features   Cocaine abuse with cocaine-induced mood disorder   Psychiatric Specialty Exam: Physical Exam  Psychiatric: His speech is normal and behavior is normal. Judgment and thought content normal. His mood appears not anxious. His affect is not angry, not blunt, not labile and not inappropriate. Cognition and memory are normal. He does not exhibit a depressed mood.    Review of Systems  Constitutional: Negative.   HENT: Negative.   Eyes: Negative.   Respiratory: Negative.   Cardiovascular: Negative.   Gastrointestinal: Negative.   Genitourinary: Negative.   Musculoskeletal: Negative.   Skin: Negative.   Neurological: Negative.   Endo/Heme/Allergies: Negative.   Psychiatric/Behavioral: Positive for depression (Stable) and substance abuse (Hx Cocaine dependence). Negative for hallucinations and memory loss. The patient has insomnia (Stable). The patient is not nervous/anxious.     Blood pressure 118/71, pulse 90, temperature 98.3 F (36.8 C), temperature source Oral, resp. rate 16, height 5\' 10"  (1.778 m), weight 92.987 kg (205 lb), SpO2 97 %.Body mass index is 29.41 kg/(m^2).  See Md's SRA                       Past Psychiatric History: Diagnosis:Major depressive disorder, recurrent severe without psychotic features, Cocaine use disorder  Hospitalizations: Yes, [facility]  Outpatient Care: [facility]  Substance Abuse Care: yes  Self-Mutilation: Cut wrist 2000   Suicidal Attempts: 2000  Violent Behaviors: DENIES   Musculoskeletal: Strength & Muscle Tone: within normal limits Gait & Station: normal Patient leans: N/A  DSM5: Schizophrenia Disorders:  NA Obsessive-Compulsive Disorders:  NA Trauma-Stressor Disorders:  NA Substance/Addictive Disorders:  Cocaine use disorder Depressive Disorders:  Major depressive disorder, recurrent severe without psychotic features  Axis Diagnosis:  AXIS I:  Major depressive disorder, recurrent severe without psychotic features, Cocaine use disorder,  AXIS II:  Deferred AXIS III:   Past Medical History  Diagnosis Date  . 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 Ejection fraction < 50%     EF 30% echo November, 2011 (MI)  / EF 40%, echo, months after MI  /  EF 45%, cardiac catheterization, June, 201 to  . CHF (congestive heart failure)     Initially post MI, then improved.  . Depression   . Hepatitis C   . Hypertension   . History of cocaine abuse     in 80's, ex-IVDU  . History of alcohol abuse   . Pulmonary nodule   . Anal fissure   . Dizziness     November, 2013  . Carotid artery disease     Doppler, June, 2014, 1-39% bilateral  . Asthma   . Sciatica    AXIS IV:  other psychosocial or environmental problems and Substance abuse issues AXIS V:  63  Level of Care:  OP  Hospital Course: Caucasian male, 48  years old was evaluated for suicidal thoughts and ideation. Patient states that he has been using cocaine to "stop his heart" but at the same time he wanted it to look like accidental over dose. Patient stated that he has been using cocaine heavily in the last three weeks. He reported poor grieving since his father passed away this past Jan 16, 2023. Patient stated that spending the holidays  without his father caused him more depression. He rated his depression 9/10 with 10 being severe depression.  While a patient in this hospital, Angel Donovan received medication management for mood stabilization treatments. He was medicated and discharged on; Abilify 10 mg daily for mood stabilization, Wellbutrin XL 300 mg daily for depression, Doxepin 150 mg Q bedtime for anxiety/insomnia and Fluoxetine 40 mg daily for depression. He was also enrolled and participated in the group counseling sessions being offered and held on this unit. He learned coping skills. Angel Donovan was resumed in all his pertinent home medication for his other pre-existing medical. He tolerated his treatment regimen without any adverse effects.  Angel Donovan's symptoms responded well to his treatment regimen. This is evidenced by his reports of improved mood and absence of suicidal ideations. He is currently being discharged to follow-up care for medication management and routine psychiatric care at the [facility] clinic in [place], [place]. He is provided with all the necessary information required to make this appointment without problems.  Upon discharge, he adamantly denies any SIHI, AVH, delusional thoughts and or paranoia. He received from the [facility], a 4 days worth, supply samples of his [facility] discharge medications. He left [facility] with all belongings in no apparent distress. Transportation per family.   Consults:  psychiatry  Significant Diagnostic Studies:  labs: CBC with diff, CMP, UDS, toxicology tests, U/A  Discharge Vitals:   Blood pressure 118/71, pulse 90, temperature 98.3 F (36.8 C), temperature source Oral, resp. rate 16, height 5\' 10"  (1.778 m), weight 92.987 kg (205 lb), SpO2 97 %. Body mass index is 29.41 kg/(m^2). Lab Results:   No results found for this or any previous visit (from the past 72 hour(s)).  Physical Findings: AIMS: Facial and Oral Movements Muscles of Facial Expression: None, normal Lips and Perioral  Area: None, normal Jaw: None, normal Tongue: None, normal,Extremity Movements Upper (arms, wrists, hands, fingers): None, normal Lower (legs, knees, ankles, toes): None, normal, Trunk Movements Neck, shoulders, hips: None, normal, Overall Severity Severity of abnormal movements (highest score from questions above): None, normal Incapacitation due to abnormal movements: None, normal Patient's awareness of abnormal movements (rate only patient's report): No Awareness, Dental Status Current problems with teeth and/or dentures?: No Does patient usually wear dentures?: No  CIWA:  CIWA-Ar Total: 0 COWS:     Psychiatric Specialty Exam: See Psychiatric Specialty Exam and Suicide Risk Assessment completed by Attending Physician prior to discharge.  Discharge destination:  Home  Is patient on multiple antipsychotic therapies at discharge:  No   Has Patient had three or more failed trials of antipsychotic monotherapy by history:  No  Recommended Plan for Multiple Antipsychotic Therapies: NA    Medication List    STOP taking these medications        carvedilol 6.25 MG tablet  Commonly known as:  COREG     famotidine 20 MG tablet  Commonly known as:  PEPCID     Fish Oil 1000 MG Caps     nicotine 14 mg/24hr patch  Commonly known as:  NICODERM CQ - dosed in mg/24 hours  nitroGLYCERIN 0.4 MG SL tablet  Commonly known as:  NITROSTAT     oxyCODONE 5 MG immediate release tablet  Commonly known as:  Oxy IR/ROXICODONE     tiZANidine 4 MG tablet  Commonly known as:  ZANAFLEX      TAKE these medications      Indication   albuterol 108 (90 BASE) MCG/ACT inhaler  Commonly known as:  PROAIR HFA  Inhale 1 puff into the lungs every 6 (six) hours as needed for wheezing or shortness of breath.   Indication:  Asthma, Chronic Obstructive Lung Disease     ARIPiprazole 10 MG tablet  Commonly known as:  ABILIFY  Take 1 tablet (10 mg total) by mouth daily. For mood control   Indication:   Mood control     atorvastatin 20 MG tablet  Commonly known as:  LIPITOR  Take 1 tablet (20 mg total) by mouth daily. For high Cholesterol/fat   Indication:  Nonfamilial Heterozygous Hypercholesterolemia, Increased Fats, Triglycerides & Cholesterol in the Blood     benazepril-hydrochlorthiazide 20-25 MG per tablet  Commonly known as:  LOTENSIN HCT  Take 1 tablet by mouth daily. For high blood pressure   Indication:  High Blood Pressure     buPROPion 300 MG 24 hr tablet  Commonly known as:  WELLBUTRIN XL  Take 1 tablet (300 mg total) by mouth daily. For depression   Indication:  Major Depressive Disorder     clindamycin 300 MG capsule  Commonly known as:  CLEOCIN  Take 2 capsules (600 mg total) by mouth every 8 (eight) hours. For infection   Indication:  Infection     clopidogrel 75 MG tablet  Commonly known as:  PLAVIX  Take 1 tablet (75 mg total) by mouth daily. For heart condition   Indication:  Heart Attack, Disease of the Peripheral Arteries     doxepin 150 MG capsule  Commonly known as:  SINEQUAN  Take 1 capsule (150 mg total) by mouth at bedtime. For anxiety/sleep   Indication:  Anxiety/sleep     FLUoxetine 40 MG capsule  Commonly known as:  PROZAC  Take 1 capsule (40 mg total) by mouth daily. For depression   Indication:  Depression, Major Depressive Disorder     meloxicam 15 MG tablet  Commonly known as:  MOBIC  Take 1 tablet (15 mg total) by mouth daily. For arthritis   Indication:  Joint Damage causing Pain and Loss of Function     Rivaroxaban 15 MG Tabs tablet  Commonly known as:  XARELTO  Take 1 tablet (15 mg total) by mouth 2 (two) times daily with a meal. Blood thinner   Indication:  Blood thinner     senna 8.6 MG Tabs tablet  Commonly known as:  SENOKOT  Take 1 tablet (8.6 mg total) by mouth 2 (two) times daily. (May purchase from over the counter at yr local pharmacy): Constipation   Indication:  Constipation       Follow-up Information    Follow  up with [facility] On 11/13/2014.   Why:  You are scheduled with [facility] on Wednesday, November 13, 2014 between 7:45 -10:30 AM for hospital discharge clinic.   Contact information:   [address] [place], [place]  [address]  [phone]     Follow-up recommendations:  Activity:  As tolerated Diet: As recommended by your primary care doctor. Keep all scheduled follow-up appointments as recommended.  Comments:  Take all your medications as prescribed by your mental healthcare provider.  Report any adverse effects and or reactions from your medicines to your outpatient provider promptly. Patient is instructed and cautioned to not engage in alcohol and or illegal drug use while on prescription medicines. In the event of worsening symptoms, patient is instructed to call the crisis hotline, 911 and or go to the nearest ED for appropriate evaluation and treatment of symptoms. Follow-up with your primary care provider for your other medical issues, concerns and or health care needs.   Total Discharge Time:  Greater than 30 minutes.  Signed: Sanjuana Kavawoko, Agnes I, PMHNP-BC 11/11/2014, 3:00 PM  I personally assessed the patient and formulated the plan Madie RenoIrving A. Dub MikesLugo, M.D.

## 2014-11-11 NOTE — Progress Notes (Signed)
Pt presents with standing hypotension upon transition from sitting to standing. Pt denies any physical symptoms. Pt encouraged to increase fluid intake.

## 2014-11-11 NOTE — Progress Notes (Signed)
D: Pt presents flat in affect but brightens upon interaction. Pt reports to be having a good day. Pt is hoping to be discharged in a timely manner for Christmas. Pt adamantly denies any SI. Pt reported that he was previously feeling lightheaded. Pt was interested in getting his BP checked. Pt's vitals were WDL. Pt actively participated within the milieu this evening. Pt denied any pain.  A: Writer administered scheduled medications to pt, per MD orders. Continued support and availability as needed was extended to this pt. Staff continue to monitor pt with q5515min checks.  R: No adverse drug reactions noted. Pt receptive to treatment. Pt remains safe at this time.

## 2014-11-11 NOTE — [facility] Group Notes (Signed)
[facility]   11/11/2014 11:37 AM    Participation Quality:  Appropraite  Mood/Affect:  Depressed, Anxious  Depression Rating:  5  Anxiety Rating:  8  Thoughts of Suicide:  No  Will you contract for safety?   NA  Current AVH:  No  Plan for Discharge/Comments:  Patient attended discharge planning group and actively participated in group.  Patient reports doing well and being ready to discharge home today  He shared his anxiety is high due to be away from his wife.  [facility].  Suicide prevention education reviewed and SPE document provided.   Transportation Means: Patient has transportation.   Supports:  Patient has a support system.   Alfredo Spong, Joesph JulyQuylle Hairston

## 2014-11-11 NOTE — Progress Notes (Signed)
[facility] :  Will you be returning to the same living situation after discharge: Yes,  Patient will return home with family At discharge, do you have transportation home?:Yes,  Family to transport patient home. Do you have the ability to pay for your medications:Yes,  Patient is able to obtain medications.  Release of information consent forms completed and in the chart;  Patient's signature needed at discharge.  Patient to Follow up at: Follow-up Information    Follow up with [facility] On 11/13/2014.   Why:  You are scheduled with [facility] on Wednesday, November 13, 2014 between 7:45 -10:30 AM for hospital discharge clinic.   Contact information:   [address] [place], [place]  [id number]  [phone]      Patient denies SI/HI:  Patient no longer endorsing SI/HI or other thoughts of self harm.   Safety Planning and Suicide Prevention discussed: .Reviewed with all patients during discharge planning group  Angel Donovan, Angel Donovan 11/11/2014, 12:07 PM

## 2014-11-11 NOTE — [facility] Suicide Risk Assessment (Signed)
Suicide Risk Assessment  Discharge Assessment     Demographic Factors:  Male and Caucasian  Total Time spent with patient: 30 minutes  Psychiatric Specialty Exam:     Blood pressure 118/71, pulse 90, temperature 98.3 F (36.8 C), temperature source Oral, resp. rate 16, height 5\' 10"  (1.778 m), weight 92.987 kg (205 lb), SpO2 97 %.Body mass index is 29.41 kg/(m^2).  General Appearance: Fairly Groomed  Patent attorneyye Contact::  Fair  Speech:  Clear and Coherent  Volume:  Decreased  Mood:  Euthymic  Affect:  Restricted  Thought Process:  Coherent and Goal Directed  Orientation:  Full (Time, Place, and Person)  Thought Content:  plans as he moves on, relapse prevention plan  Suicidal Thoughts:  No  Homicidal Thoughts:  No  Memory:  Immediate;   Fair Recent;   Fair Remote;   Fair  Judgement:  Fair  Insight:  Present  Psychomotor Activity:  Normal  Concentration:  Fair  Recall:  FiservFair  Fund of Knowledge:Fair  Language: Fair  Akathisia:  No  Handed:    AIMS (if indicated):     Assets:  Desire for Improvement Housing Social Support Transportation  Sleep:  Number of Hours: 6.5    Musculoskeletal: Strength & Muscle Tone: within normal limits Gait & Station: normal Patient leans: N/A   Mental Status Per Nursing Assessment::   On Admission:  Self-harm thoughts, Self-harm behaviors  Current Mental Status by Physician: In full contact with reality. There are no active SI plans or intent. Express commitment to abstain from using cocaine. States he uses only if he gets around certain people and he is planning to avoid them. He also express his committment to getting healthy for his own sake and his family. Understands he will continue to grief the death of his father but that he cant make his lost the focal point of attention in his life. ( Admits that his father would not want it this way either). He is looking forward to go home   Loss Factors: Loss of significant relationship and  Decline in physical health  Historical Factors: NA  Risk Reduction Factors:   Sense of responsibility to family, Living with another person, especially a relative and Positive social support  Continued Clinical Symptoms:  Depression:   Comorbid alcohol abuse/dependence Alcohol/Substance Abuse/Dependencies  Cognitive Features That Contribute To Risk:  Closed-mindedness Polarized thinking Thought constriction (tunnel vision)    Suicide Risk:  Minimal: No identifiable suicidal ideation.  Patients presenting with no risk factors but with morbid ruminations; may be classified as minimal risk based on the severity of the depressive symptoms  Discharge Diagnoses:   AXIS I:  Major Depression recurrent severe, Alcohol Abuse, Cocaine Abuse Bereavement AXIS II:  No diagnosis AXIS III:   Past Medical History  Diagnosis Date  . 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 Ejection fraction < 50%     EF 30% echo November, 2011 (MI)  / EF 40%, echo, months after MI  /  EF 45%, cardiac catheterization, June, 201 to  . CHF (congestive heart failure)     Initially post MI, then improved.  . Depression   . Hepatitis C   . Hypertension   . History of cocaine abuse     in 80's, ex-IVDU  . History of alcohol  abuse   . Pulmonary nodule   . Anal fissure   . Dizziness     November, 2013  . Carotid artery disease     Doppler, June, 2014, 1-39% bilateral  . Asthma   . Sciatica    AXIS IV:  other psychosocial or environmental problems AXIS V:  61-70 mild symptoms  Plan Of Care/Follow-up recommendations:  Activity:  as tolerated Diet:  regular Follow up [facility] Dr. Rosalia Hammersay and a counselor  Is patient on multiple antipsychotic therapies at discharge:  No   Has Patient had three or more failed  trials of antipsychotic monotherapy by history:  No  Recommended Plan for Multiple Antipsychotic Therapies: NA    Briseida Gittings A 11/11/2014, 12:53 PM

## 2014-11-13 NOTE — Progress Notes (Signed)
Patient Discharge Instructions:  After Visit Summary (AVS):   Faxed to:  11/13/14 Discharge Summary Note:   Faxed to:  11/13/14 Psychiatric Admission Assessment Note:   Faxed to:  11/13/14 Suicide Risk Assessment - Discharge Assessment:   Faxed to:  11/13/14 Faxed/Sent to the Next Level Care provider:  11/13/14 [facility] @ [phone]  Jerelene ReddenSheena E [place], 11/13/2014, 2:41 PM

## 2014-12-19 ENCOUNTER — Other Ambulatory Visit: Payer: Self-pay | Admitting: Internal Medicine

## 2014-12-19 ENCOUNTER — Other Ambulatory Visit: Payer: Self-pay | Admitting: Cardiology

## 2014-12-24 ENCOUNTER — Other Ambulatory Visit: Payer: Self-pay | Admitting: Internal Medicine

## 2015-02-17 ENCOUNTER — Other Ambulatory Visit: Payer: Self-pay | Admitting: Cardiology

## 2015-02-26 ENCOUNTER — Ambulatory Visit: Payer: Self-pay | Admitting: Cardiology

## 2015-03-13 ENCOUNTER — Ambulatory Visit (INDEPENDENT_AMBULATORY_CARE_PROVIDER_SITE_OTHER): Payer: Medicare Other | Admitting: Cardiology

## 2015-03-13 ENCOUNTER — Encounter: Payer: Self-pay | Admitting: Cardiology

## 2015-03-13 VITALS — BP 118/90 | HR 84 | Ht 70.0 in | Wt 220.0 lb

## 2015-03-13 DIAGNOSIS — F332 Major depressive disorder, recurrent severe without psychotic features: Secondary | ICD-10-CM

## 2015-03-13 DIAGNOSIS — I2510 Atherosclerotic heart disease of native coronary artery without angina pectoris: Secondary | ICD-10-CM | POA: Diagnosis not present

## 2015-03-13 DIAGNOSIS — L03114 Cellulitis of left upper limb: Secondary | ICD-10-CM | POA: Diagnosis not present

## 2015-03-13 NOTE — Patient Instructions (Signed)
Your physician recommends that you continue on your current medications as directed. Please refer to the Current Medication list given to you today. Your physician recommends that you schedule a follow-up appointment in: 6 months. You will receive a reminder letter in the mail in about 4 months reminding you to call and schedule your appointment. If you don't receive this letter, please contact our office. 

## 2015-03-13 NOTE — Assessment & Plan Note (Signed)
Patient has known coronary disease with MIs and stents in the past. His coronary status has been stable. Is very difficult to decide about appropriate medications for him. On Coumadin, it will be most appropriate for him to not be on aspirin or Plavix. When his Coumadin is stopped, he should be put back on aspirin.

## 2015-03-13 NOTE — Assessment & Plan Note (Signed)
Coumadin is being used currently for the patient. When this is stopped he should be put back on aspirin.

## 2015-03-13 NOTE — Progress Notes (Signed)
Cardiology Office Note   Date:  03/13/2015   ID:  Angel Donovan, DOB 1965/12/13, MRN [id number]  PCP:  Toma Deiters, MD  Cardiologist:  Willa Rough, MD   Chief Complaint  Patient presents with  . Appointment    Follow-up coronary artery disease      History of Present Illness: Angel Donovan is a 49 y.o. male who presents today to follow-up coronary disease. I saw him last October, 2015. He was hospitalized in December, 2015. He had problems with severe polysubstance abuse including cocaine. He was also treated by psychiatry. He had venous thromboses in his arms that were partial. Decision was made to use anticoagulation. Aspirin was stopped and he was continued on Plavix and anticoagulation for a period of time. He is now on only Coumadin and I am in agreement with this.    Past Medical History  Diagnosis Date  . 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 Ejection fraction < 50%     EF 30% echo November, 2011 (MI)  / EF 40%, echo, months after MI  /  EF 45%, cardiac catheterization, June, 201 to  . CHF (congestive heart failure)     Initially post MI, then improved.  . Depression   . Hepatitis C   . Hypertension   . History of cocaine abuse     in 80's, ex-IVDU  . History of alcohol abuse   . Pulmonary nodule   . Anal fissure   . Dizziness     November, 2013  . Carotid artery disease     Doppler, June, 2014, 1-39% bilateral  . Asthma   . Sciatica     Past Surgical History  Procedure Laterality Date  . Cardiac catheterization      with PCI RCA 10/19/2010    Patient Active Problem List   Diagnosis Date Noted  . Cocaine abuse with cocaine-induced mood disorder 11/09/2014  . Major depressive disorder, recurrent severe without psychotic features  11/08/2014  . Major depressive disorder, recurrent, severe without psychotic features   . Thrombosis of brachial vein 11/06/2014  . Non-STEMI (non-ST elevated myocardial infarction) 11/04/2014  . Suicide attempt 11/04/2014  . Polysubstance abuse 11/04/2014  . HLD (hyperlipidemia) 11/04/2014  . Left arm swelling 11/04/2014  . Cellulitis of left upper extremity   . Cellulitis of right upper extremity   . Cocaine use   . Alcohol-induced mood disorder 07/09/2014  . Pre-syncope 11/13/2013  . Carotid artery disease   . Chronic combined systolic and diastolic CHF (congestive heart failure) 04/27/2013  . Dizziness   . Hypotension 05/04/2012  . Coronary artery disease   . Ejection fraction < 50%   . DYSLIPIDEMIA 01/11/2008  . 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 ANAL FISSURE 01/10/2008  . Nonspecific (abnormal) findings on radiological and other examination of body structure 11/28/2007  . ABNORMAL RADIOLOGIAL EXAMINATION 11/28/2007  . PULMONARY NODULE 09/08/2007  . HIP PAIN, RIGHT 07/28/2007  . 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 KERATODERMA, ACQUIRED 04/12/2007  . DYSURIA 04/12/2007  . TOBACCO USE 02/21/2007  . DYSPEPSIA 02/21/2007  . Chronic hepatitis C 12/19/2006  . ABUSE, ALCOHOL, IN REMISSION 12/19/2006  . ABUSE, COCAINE, IN REMISSION 12/19/2006  . Depression 12/19/2006  . Essential hypertension 12/19/2006      Current Outpatient Prescriptions  Medication Sig Dispense  Refill  . albuterol (PROAIR HFA) 108 (90 BASE) MCG/ACT inhaler Inhale 1 puff into the lungs every 6 (six) hours as needed for wheezing or shortness of breath.    . ARIPiprazole (ABILIFY) 10 MG tablet Take 1 tablet (10 mg total) by mouth daily. For mood control 30 tablet 0  . atorvastatin (LIPITOR) 20 MG tablet Take 1 tablet (20 mg total) by mouth daily. For high Cholesterol/fat    . atorvastatin (LIPITOR) 20 MG tablet TAKE 1 TABLET BY MOUTH EVERY DAY 30 tablet 6  . benazepril-hydrochlorthiazide (LOTENSIN HCT) 20-25 MG per  tablet Take 1 tablet by mouth daily. For high blood pressure    . buPROPion (WELLBUTRIN XL) 300 MG 24 hr tablet Take 1 tablet (300 mg total) by mouth daily. For depression 30 tablet 0  . carvedilol (COREG) 25 MG tablet TAKE 1 TABLET BY MOUTH TWICE DAILY 60 tablet 2  . clindamycin (CLEOCIN) 300 MG capsule Take 2 capsules (600 mg total) by mouth every 8 (eight) hours. For infection    . doxepin (SINEQUAN) 150 MG capsule Take 1 capsule (150 mg total) by mouth at bedtime. For anxiety/sleep 30 capsule 0  . FLUoxetine (PROZAC) 40 MG capsule Take 1 capsule (40 mg total) by mouth daily. For depression 30 capsule 0  . senna (SENOKOT) 8.6 MG TABS tablet Take 1 tablet (8.6 mg total) by mouth 2 (two) times daily. (May purchase from over the counter at yr local pharmacy): Constipation 60 each 0  . warfarin (COUMADIN) 7.5 MG tablet Take 7.5 mg by mouth at bedtime. Dr. Olena Leatherwood managing Coumadin started 03/06/15.     No current facility-administered medications for this visit.    Allergies:   Review of patient's allergies indicates no known allergies.    Social History:  The patient  reports that he quit smoking about 4 years ago. His smoking use included Cigarettes. He has a 35 pack-year smoking history. He has never used smokeless tobacco. He reports that he uses illicit drugs (Cocaine). He reports that he does not drink alcohol.   Family History:  The patient's family history includes Cancer in his father; Cancer - Colon in his father; Coronary artery disease in his father.    ROS:  Please see the history of present illness.    Patient denies fever, chills, headache, sweats, rash, change in vision, change in hearing, cough, nausea or vomiting, urinary symptoms. He has rare vague chest discomfort.    PHYSICAL EXAM: VS:  BP 118/90 mmHg  Pulse 84  Ht  (1.778 m)  Wt 220 lb (99.791 kg)  BMI 31.57 kg/m2  SpO2 93% , The patient has a persistent tapping motion of his legs. He appears depressed. He is  responsive and appropriate. He is oriented to person time and place. Head is atraumatic. Sclera and conjunctiva are normal. There is no jugular venous distention. Lungs are clear. Respiratory effort is nonlabored. Cardiac exam reveals S1 and S2. Abdomen is soft and there is no peripheral edema. There is no obvious swelling in his arms.  EKG:   EKG is not done today.   Recent Labs: 11/05/2014: ALT 11; BUN 8; Creatinine 1.12; Potassium 3.9; Sodium 144 11/07/2014: Hemoglobin 12.9*; Platelets 228    Lipid Panel    Component Value Date/Time   CHOL 101 07/11/2014 0620   TRIG 218* 07/11/2014 0620   HDL 23* 07/11/2014 0620   CHOLHDL 4.4 07/11/2014 0620   VLDL 44* 07/11/2014 0620   LDLCALC 34 07/11/2014 4098  Wt Readings from Last 3 Encounters:  03/13/15 220 lb (99.791 kg)  11/08/14 205 lb (92.987 kg)  09/02/14 222 lb 12 oz (101.039 kg)      Current medicines are reviewed  The patient understands his medications.   ASSESSMENT AND PLAN:

## 2015-03-13 NOTE — Assessment & Plan Note (Signed)
He was hospitalized with this recently including multi drug abuse and suicidal ideation. He is more stable today. This is clearly his major problem.

## 2015-05-19 ENCOUNTER — Other Ambulatory Visit: Payer: Self-pay | Admitting: Cardiology

## 2015-07-17 ENCOUNTER — Other Ambulatory Visit: Payer: Self-pay | Admitting: Cardiology

## 2015-08-13 ENCOUNTER — Telehealth: Payer: Self-pay | Admitting: Cardiology

## 2015-08-13 NOTE — Telephone Encounter (Signed)
PCP told patient we were no longer in practice and referred him to [facility]

## 2015-08-20 ENCOUNTER — Ambulatory Visit (INDEPENDENT_AMBULATORY_CARE_PROVIDER_SITE_OTHER): Payer: Medicare Other | Admitting: Cardiology

## 2015-08-20 ENCOUNTER — Encounter: Payer: Self-pay | Admitting: Cardiology

## 2015-08-20 VITALS — BP 123/85 | HR 97 | Ht 70.0 in | Wt 220.4 lb

## 2015-08-20 DIAGNOSIS — F1410 Cocaine abuse, uncomplicated: Secondary | ICD-10-CM

## 2015-08-20 DIAGNOSIS — I2510 Atherosclerotic heart disease of native coronary artery without angina pectoris: Secondary | ICD-10-CM | POA: Diagnosis not present

## 2015-08-20 DIAGNOSIS — R000 Tachycardia, unspecified: Secondary | ICD-10-CM | POA: Diagnosis not present

## 2015-08-20 DIAGNOSIS — F1411 Cocaine abuse, in remission: Secondary | ICD-10-CM

## 2015-08-20 NOTE — Assessment & Plan Note (Signed)
The decision had been made elsewhere to treat this problem with Coumadin. He hopes that he will be finished in approximately one month. At that time it will be very important to get him back on aspirin.

## 2015-08-20 NOTE — Assessment & Plan Note (Signed)
Coronary disease is stable. He received a bare metal stent to a large circumflex in 2011. His EF is improved over time from 45-54%. He is not having any significant symptoms. He is on Coumadin at this time and therefore not on aspirin. He is on atorvastatin. No further workup.

## 2015-08-20 NOTE — Assessment & Plan Note (Signed)
The patient has had problems with polysubstance abuse for many years. He continues to do his best and he is stable at this time.

## 2015-08-20 NOTE — Progress Notes (Signed)
Cardiology Office Note   Date:  08/20/2015   ID:  Angel Donovan, DOB [birth year], MRN [id number]  PCP:  Toma Deiters, MD  Cardiologist:  Willa Rough, MD   Chief Complaint  Patient presents with  . Appointment    Follow-up coronary artery disease      History of Present Illness: Angel Donovan is a 49 y.o. male who presents today to follow-up coronary disease. He is stable today. He's had many problems over the years from polysubstance abuse. Fortunately he is stable at this time and doing his best. He has noted some increased heart rate at nighttime. He is not having any chest pain.  Past Medical History  Diagnosis Date  . 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 Ejection fraction < 50%     EF 30% echo November, 2011 (MI)  / EF 40%, echo, months after MI  /  EF 45%, cardiac catheterization, June, 201 to  . CHF (congestive heart failure)     Initially post MI, then improved.  . Depression   . Hepatitis C   . Hypertension   . History of cocaine abuse     in 80's, ex-IVDU  . History of alcohol abuse   . Pulmonary nodule   . Anal fissure   . Dizziness     November, 2013  . Carotid artery disease     Doppler, June, 2014, 1-39% bilateral  . Asthma   . Sciatica     Past Surgical History  Procedure Laterality Date  . Cardiac catheterization      with PCI RCA 10/19/2010    Patient Active Problem List   Diagnosis Date Noted  . Cocaine abuse with cocaine-induced mood disorder 11/09/2014  . Major depressive disorder, recurrent severe without psychotic features 11/08/2014  . Major depressive disorder, recurrent, severe without psychotic features   . Thrombosis of brachial vein 11/06/2014  . Non-STEMI (non-ST elevated myocardial infarction) 11/04/2014  .  Suicide attempt 11/04/2014  . Polysubstance abuse 11/04/2014  . HLD (hyperlipidemia) 11/04/2014  . Left arm swelling 11/04/2014  . Cellulitis of left upper extremity   . Cellulitis of right upper extremity   . Cocaine use   . Alcohol-induced mood disorder 07/09/2014  . Pre-syncope 11/13/2013  . Carotid artery disease   . Chronic combined systolic and diastolic CHF (congestive heart failure) 04/27/2013  . Dizziness   . Hypotension 05/04/2012  . Coronary artery disease   . Ejection fraction < 50%   . DYSLIPIDEMIA 01/11/2008  . 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 ANAL FISSURE 01/10/2008  . Nonspecific (abnormal) findings on radiological and other examination of body structure 11/28/2007  . ABNORMAL RADIOLOGIAL EXAMINATION 11/28/2007  . PULMONARY NODULE 09/08/2007  . HIP PAIN, RIGHT 07/28/2007  . 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 KERATODERMA, ACQUIRED 04/12/2007  . DYSURIA 04/12/2007  . TOBACCO USE 02/21/2007  . DYSPEPSIA 02/21/2007  . Chronic hepatitis C 12/19/2006  . ABUSE, ALCOHOL, IN REMISSION 12/19/2006  . ABUSE, COCAINE, IN REMISSION 12/19/2006  . Depression 12/19/2006  . Essential hypertension 12/19/2006      Current Outpatient Prescriptions  Medication Sig Dispense Refill  . albuterol (PROAIR HFA) 108 (90 BASE) MCG/ACT inhaler Inhale 1 puff into the lungs every 6 (six) hours as needed for wheezing or shortness of breath.    Marland Kitchen  ARIPiprazole (ABILIFY) 10 MG tablet Take 1 tablet (10 mg total) by mouth daily. For mood control 30 tablet 0  . atorvastatin (LIPITOR) 20 MG tablet Take 1 tablet (20 mg total) by mouth daily. For high Cholesterol/fat    . benazepril-hydrochlorthiazide (LOTENSIN HCT) 20-25 MG per tablet Take 1 tablet by mouth daily. For high blood pressure (Patient taking differently: Take 0.5 tablets by mouth daily. For high blood pressure)    . buPROPion (WELLBUTRIN XL) 300 MG 24 hr tablet Take 1 tablet (300 mg total) by mouth daily. For depression 30 tablet 0  . carvedilol  (COREG) 6.25 MG tablet Take 6.25 mg by mouth 2 (two) times daily.    Marland Kitchen doxepin (SINEQUAN) 150 MG capsule Take 1 capsule (150 mg total) by mouth at bedtime. For anxiety/sleep 30 capsule 0  . famotidine (PEPCID) 20 MG tablet Take 20 mg by mouth 2 (two) times daily.    Marland Kitchen FLUoxetine (PROZAC) 40 MG capsule Take 1 capsule (40 mg total) by mouth daily. For depression 30 capsule 0  . tiZANidine (ZANAFLEX) 4 MG tablet Take 4 mg by mouth daily.    Marland Kitchen warfarin (COUMADIN) 5 MG tablet Take 5 mg by mouth as directed. Dr. Olena Leatherwood managing.     No current facility-administered medications for this visit.    Allergies:   Review of patient's allergies indicates no known allergies.    Social History:  The patient  reports that he quit smoking about 4 years ago. His smoking use included Cigarettes. He has a 35 pack-year smoking history. He has never used smokeless tobacco. He reports that he uses illicit drugs (Cocaine). He reports that he does not drink alcohol.   Family History:  The patient's family history includes Cancer in his father; Cancer - Colon in his father; Coronary artery disease in his father.    ROS:  Please see the history of present illness.   Patient denies fever, chills, headache, sweats, rash, change in vision, change in hearing, chest pain, cough, nausea or vomiting, urinary symptoms. All other systems are reviewed and are negative.     PHYSICAL EXAM: VS:  BP 123/85 mmHg  Pulse 97  Ht  (1.778 m)  Wt 220 lb 6.4 oz (99.973 kg)  BMI 31.62 kg/m2  SpO2 97% , The patient is oriented to person time and place. Affect is normal. He has continuous jittery motion of his left leg. This is old. Head is atraumatic. Sclera and conjunctiva are normal. There is no jugular venous distention. Lungs are clear. Respiratory effort is nonlabored. Cardiac exam reveals an S1 and S2. The abdomen is soft and there is no peripheral edema. There are no musculoskeletal deformities. There are no skin  rashes.   EKG:   EKG is not done today.   Recent Labs: 11/05/2014: ALT 11; BUN 8; Creatinine, Ser 1.12; Potassium 3.9; Sodium 144 11/07/2014: Hemoglobin 12.9*; Platelets 228    Lipid Panel    Component Value Date/Time   CHOL 101 07/11/2014 0620   TRIG 218* 07/11/2014 0620   HDL 23* 07/11/2014 0620   CHOLHDL 4.4 07/11/2014 0620   VLDL 44* 07/11/2014 0620   LDLCALC 34 07/11/2014 0620      Wt Readings from Last 3 Encounters:  08/20/15 220 lb 6.4 oz (99.973 kg)  03/13/15 220 lb (99.791 kg)  11/08/14 205 lb (92.987 kg)      Current medicines are reviewed  The patient understands his medications.     ASSESSMENT AND PLAN:

## 2015-08-20 NOTE — Patient Instructions (Signed)
Your physician recommends that you continue on your current medications as directed. Please refer to the Current Medication list given to you today. Your physician recommends that you schedule a follow-up appointment in: 6 months. You will receive a reminder letter in the mail in about 4 months reminding you to call and schedule your appointment. If you don't receive this letter, please contact our office. 

## 2015-08-20 NOTE — Assessment & Plan Note (Signed)
The patient has had a sensation of increased heart rate at nighttime. He says that he can monitor this with his telephone. At times his rate has been up to 130. I have chosen not to monitor him. I've chosen to increase his carvedilol to 12.5 twice a day.

## 2015-10-31 ENCOUNTER — Encounter: Payer: Self-pay | Admitting: Gastroenterology

## 2016-02-11 ENCOUNTER — Encounter: Payer: Self-pay | Admitting: *Deleted

## 2016-02-12 ENCOUNTER — Other Ambulatory Visit: Payer: Self-pay | Admitting: Cardiology

## 2016-02-12 ENCOUNTER — Ambulatory Visit: Payer: Self-pay | Admitting: Cardiovascular Disease

## 2016-02-27 DIAGNOSIS — I10 Essential (primary) hypertension: Secondary | ICD-10-CM | POA: Diagnosis not present

## 2016-02-27 DIAGNOSIS — Z125 Encounter for screening for malignant neoplasm of prostate: Secondary | ICD-10-CM | POA: Diagnosis not present

## 2016-02-27 DIAGNOSIS — Z0000 Encounter for general adult medical examination without abnormal findings: Secondary | ICD-10-CM | POA: Diagnosis not present

## 2016-03-02 ENCOUNTER — Encounter: Payer: Self-pay | Admitting: Cardiovascular Disease

## 2016-03-02 ENCOUNTER — Ambulatory Visit (INDEPENDENT_AMBULATORY_CARE_PROVIDER_SITE_OTHER): Payer: Medicare Other | Admitting: Cardiovascular Disease

## 2016-03-02 VITALS — BP 130/86 | HR 92 | Ht 70.0 in | Wt 205.0 lb

## 2016-03-02 DIAGNOSIS — F1410 Cocaine abuse, uncomplicated: Secondary | ICD-10-CM

## 2016-03-02 DIAGNOSIS — I25118 Atherosclerotic heart disease of native coronary artery with other forms of angina pectoris: Secondary | ICD-10-CM | POA: Diagnosis not present

## 2016-03-02 DIAGNOSIS — I779 Disorder of arteries and arterioles, unspecified: Secondary | ICD-10-CM

## 2016-03-02 DIAGNOSIS — F1411 Cocaine abuse, in remission: Secondary | ICD-10-CM

## 2016-03-02 DIAGNOSIS — I10 Essential (primary) hypertension: Secondary | ICD-10-CM

## 2016-03-02 DIAGNOSIS — I739 Peripheral vascular disease, unspecified: Secondary | ICD-10-CM

## 2016-03-02 MED ORDER — ASPIRIN EC 81 MG PO TBEC: 81.0000 mg | DELAYED_RELEASE_TABLET | Freq: Every day | ORAL | Status: DC

## 2016-03-02 NOTE — Progress Notes (Signed)
Patient ID: Angel Donovan, male   DOB: [birth year], 50 y.o.   MRN: [id number]      SUBJECTIVE: The patient presents for routine follow-up. He has a history of non-STEMI with a bare-metal stent to a large circumflex in 2011, polysubstance abuse with cocaine abuse, hypertension, brachial vein thrombosis, chronic hepatitis C, and bilateral carotid artery disease. He is a former patient of Dr. [name]. This is my first time meeting him.  Most recent echocardiogram dated 11/05/2014 showed mildly reduced left ventricular systolic function, EF 45-50%, with inferior wall hypokinesis.  He has had one episode of chest pain since September 2016 requiring one sublingual nitroglycerin for relief. He does not get much activity and struggles with depression. He had one episode of bilateral feet swelling in the past 6 months.   Review of Systems: As per "subjective", otherwise negative.  No Known Allergies  Current Outpatient Prescriptions  Medication Sig Dispense Refill  . albuterol (PROAIR HFA) 108 (90 BASE) MCG/ACT inhaler Inhale 1 puff into the lungs every 6 (six) hours as needed for wheezing or shortness of breath.    . ARIPiprazole (ABILIFY) 10 MG tablet Take 1 tablet (10 mg total) by mouth daily. For mood control (Patient taking differently: Take 10 mg by mouth 2 (two) times daily. For mood control) 30 tablet 0  . atorvastatin (LIPITOR) 20 MG tablet TAKE 1 TABLET BY MOUTH EVERY DAY 30 tablet 6  . benazepril-hydrochlorthiazide (LOTENSIN HCT) 20-25 MG per tablet Take 1 tablet by mouth daily. For high blood pressure (Patient taking differently: Take 0.5 tablets by mouth daily. For high blood pressure)    . buPROPion (WELLBUTRIN XL) 150 MG 24 hr tablet Take 150 mg by mouth daily.    Marland Kitchen. buPROPion (WELLBUTRIN XL) 300 MG 24 hr tablet Take 1 tablet (300 mg total) by mouth daily. For depression 30 tablet 0  . carvedilol (COREG) 6.25 MG tablet Take 6.25 mg by mouth 2 (two) times daily.    Marland Kitchen. doxepin (SINEQUAN) 150  MG capsule Take 1 capsule (150 mg total) by mouth at bedtime. For anxiety/sleep 30 capsule 0  . famotidine (PEPCID) 20 MG tablet Take 20 mg by mouth 2 (two) times daily.    Marland Kitchen. FLUoxetine (PROZAC) 20 MG capsule Take 60 capsules by mouth daily.    Marland Kitchen. tiZANidine (ZANAFLEX) 4 MG tablet Take 4 mg by mouth daily.     No current facility-administered medications for this visit.    Past Medical History  Diagnosis Date  . 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 Ejection fraction < 50%     EF 30% echo November, 2011 (MI)  / EF 40%, echo, months after MI  /  EF 45%, cardiac catheterization, June, 201 to  . CHF (congestive heart failure) (HCC)     Initially post MI, then improved.  . Depression   . Hepatitis C   . Hypertension   . History of cocaine abuse     in 80's, ex-IVDU  . History of alcohol abuse   . Pulmonary nodule   . Anal fissure   . Dizziness     November, 2013  . Carotid artery disease (HCC)     Doppler, June, 2014, 1-39% bilateral  . Asthma   . Sciatica     Past Surgical History  Procedure Laterality  Date  . Cardiac catheterization      with PCI RCA 10/19/2010    Social History   Social History  . Marital Status: Married    Spouse Name: N/A  . Number of Children: N/A  . Years of Education: N/A   Occupational History  . Not on file.   Social History Main Topics  . Smoking status: Former Smoker -- 1.00 packs/day for 35 years    Types: Cigarettes    Start date: 06/03/1976    Quit date: 10/18/2010  . Smokeless tobacco: Never Used  . Alcohol Use: No     Comment: quit  . Drug Use: Yes    Special: Cocaine  . Sexual Activity: Yes    Birth Control/ Protection: None   Other Topics Concern  . Not on file   Social History Narrative     Filed Vitals:    03/02/16 1125  BP: 130/86  Pulse: 92  Height:  (1.778 m)  Weight: 205 lb (92.987 kg)    PHYSICAL EXAM General: NAD HEENT: Normal. Neck: No JVD, no thyromegaly. Lungs: Clear to auscultation bilaterally with normal respiratory effort. CV: Nondisplaced PMI.  Regular rate and rhythm, normal S1/S2, no S3/S4, no murmur. No pretibial or periankle edema.  No carotid bruit.   Abdomen: Soft, nontender, no distention.  Neurologic: Alert and oriented.  Psych: Flat affect. Skin: Normal. Musculoskeletal: No gross deformities.  ECG: Most recent ECG reviewed.      ASSESSMENT AND PLAN: 1. CAD: Symptomatically stable. On Coreg, ASA, and statin.    2. Essential HTN: Controlled on Coreg and benazepril-HCTZ. No changes.  3. Carotid artery disease: Mild in June 2014. Will repeat in the future. No bruits by exam.  Dispo: fu 1 year.  Prentice Docker, M.D., F.A.C.C.

## 2016-03-02 NOTE — Patient Instructions (Signed)
Continue all current medications. Your physician wants you to follow up in:  1 year.  You will receive a reminder letter in the mail one-two months in advance.  If you don't receive a letter, please call our office to schedule the follow up appointment   

## 2016-04-09 ENCOUNTER — Encounter (INDEPENDENT_AMBULATORY_CARE_PROVIDER_SITE_OTHER): Payer: Self-pay | Admitting: *Deleted

## 2016-04-29 ENCOUNTER — Encounter (INDEPENDENT_AMBULATORY_CARE_PROVIDER_SITE_OTHER): Payer: Self-pay | Admitting: *Deleted

## 2016-04-29 ENCOUNTER — Encounter (INDEPENDENT_AMBULATORY_CARE_PROVIDER_SITE_OTHER): Payer: Self-pay | Admitting: Internal Medicine

## 2016-04-29 ENCOUNTER — Ambulatory Visit (INDEPENDENT_AMBULATORY_CARE_PROVIDER_SITE_OTHER): Payer: Medicare Other | Admitting: Internal Medicine

## 2016-04-29 ENCOUNTER — Telehealth (INDEPENDENT_AMBULATORY_CARE_PROVIDER_SITE_OTHER): Payer: Self-pay | Admitting: Internal Medicine

## 2016-04-29 VITALS — BP 102/68 | HR 56 | Temp 98.0°F | Ht 69.0 in | Wt 207.6 lb

## 2016-04-29 DIAGNOSIS — B1920 Unspecified viral hepatitis C without hepatic coma: Secondary | ICD-10-CM | POA: Diagnosis not present

## 2016-04-29 NOTE — Patient Instructions (Signed)
Labs, OV pending.

## 2016-04-29 NOTE — Progress Notes (Addendum)
Subjective:    Patient ID: Angel Donovan, male    DOB: July 12, 1966, 50 y.o.   MRN: [id number]  HPI Referred by Dr. Olena LeatherwoodHasanaj for Hepatitis C tx. Diagnosed with Hepatitis C in 2000 at [facility]. He has not been checked in 10 yrs. Last IV drug use was in March. Appetite is good. No weight loss.  Usually has a BM daily. No melena or BRRB. Hx of depression which he says is controlled at this time.  [facility] and had stage 4 liver disease.   Have you ever been treated for Hepatitis C? Has [facility].  Any hx of IV drug abuse or drug abuse? Yes. Cocaine Are you drinking now? no Any hx of etoh abuse? no Do you have tattoos? One on left arm.  Have you ever received a blood transfusion? no When were you diagnosed with Hepatitis C? 2000 Any hx of mental illness requiring treatment? Yes, in 2000 in prison.  Do you have suicidal thoughts? No.   Review of Systems Past Medical History  Diagnosis Date  . 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 Ejection fraction < 50%     EF 30% echo November, 2011 (MI)  / EF 40%, echo, months after MI  /  EF 45%, cardiac catheterization, June, 201 to  . CHF (congestive heart failure) (HCC)     Initially post MI, then improved.  . Depression   . Hepatitis C   . Hypertension   . History of cocaine abuse     in 80's, ex-IVDU  . History of alcohol abuse   . Pulmonary nodule   . Anal fissure   . Dizziness     November, 2013  . Carotid artery disease (HCC)     Doppler, June, 2014, 1-39% bilateral  . Asthma   . Sciatica     Past Surgical History  Procedure Laterality Date  . Cardiac catheterization      with PCI RCA 10/19/2010    No Known  Allergies  Current Outpatient Prescriptions on File Prior to Visit  Medication Sig Dispense Refill  . albuterol (PROAIR HFA) 108 (90 BASE) MCG/ACT inhaler Inhale 1 puff into the lungs every 6 (six) hours as needed for wheezing or shortness of breath.    . ARIPiprazole (ABILIFY) 10 MG tablet Take 1 tablet (10 mg total) by mouth daily. For mood control (Patient taking differently: Take 10 mg by mouth 2 (two) times daily. For mood control) 30 tablet 0  . aspirin EC 81 MG tablet Take 1 tablet (81 mg total) by mouth daily.    Marland Kitchen. atorvastatin (LIPITOR) 20 MG tablet TAKE 1 TABLET BY MOUTH EVERY DAY 30 tablet 6  . benazepril-hydrochlorthiazide (LOTENSIN HCT) 20-25 MG per tablet Take 1 tablet by mouth daily. For high blood pressure (Patient taking differently: Take 0.5 tablets by mouth daily. For high blood pressure)    . buPROPion (WELLBUTRIN XL) 300 MG 24 hr tablet Take 1 tablet (300 mg total) by mouth daily. For depression 30 tablet 0  . carvedilol (COREG) 6.25 MG tablet Take 12.5 mg by mouth 2 (two) times daily.     Marland Kitchen. doxepin (SINEQUAN) 150 MG capsule Take 1 capsule (  150 mg total) by mouth at bedtime. For anxiety/sleep 30 capsule 0  . famotidine (PEPCID) 20 MG tablet Take 20 mg by mouth 2 (two) times daily.    Marland Kitchen FLUoxetine (PROZAC) 20 MG capsule Take 60 capsules by mouth daily.    Marland Kitchen tiZANidine (ZANAFLEX) 4 MG tablet Take 4 mg by mouth daily.     No current facility-administered medications on file prior to visit.        Objective:   Physical Exam Blood pressure 102/68, pulse 56, temperature 98 F (36.7 C), height  (1.753 m), weight 207 lb 9.6 oz (94.167 kg).  Alert and oriented. Skin warm and dry. Oral mucosa is moist.   . Sclera anicteric, conjunctivae is pink. Thyroid not enlarged. No cervical lymphadenopathy. Lungs clear. Heart regular rate and rhythm.  Abdomen is soft. Bowel sounds are positive. No hepatomegaly. No abdominal masses felt. No tenderness.  No edema to lower extremities.          Assessment & Plan:  Hepatitis C. CBC, Hepatic function, AFP, Hep C antibody, Hep C quaint, Hep C genotype, urine drug screen [place] elastrography.] Brochures on Hepatitis C given to patient.

## 2016-04-29 NOTE — Telephone Encounter (Signed)
Labs ordered.

## 2016-04-30 LAB — CBC WITH DIFFERENTIAL/PLATELET
Basophils Absolute: 0 cells/uL (ref 0–200)
Basophils Relative: 0 %
Eosinophils Absolute: 216 cells/uL (ref 15–500)
Eosinophils Relative: 3 %
HCT: 50.1 % — ABNORMAL HIGH (ref 38.5–50.0)
Hemoglobin: 16.5 g/dL (ref 13.2–17.1)
LYMPHS ABS: 1224 {cells}/uL (ref 850–3900)
LYMPHS PCT: 17 %
MCH: 28.8 pg (ref 27.0–33.0)
MCHC: 32.9 g/dL (ref 32.0–36.0)
MCV: 87.6 fL (ref 80.0–100.0)
MONO ABS: 864 {cells}/uL (ref 200–950)
MPV: 10.6 fL (ref 7.5–12.5)
Monocytes Relative: 12 %
NEUTROS ABS: 4896 {cells}/uL (ref 1500–7800)
Neutrophils Relative %: 68 %
Platelets: 231 10*3/uL (ref 140–400)
RBC: 5.72 MIL/uL (ref 4.20–5.80)
RDW: 14.6 % (ref 11.0–15.0)
WBC: 7.2 10*3/uL (ref 3.8–10.8)

## 2016-04-30 LAB — HEPATITIS C RNA QUANTITATIVE: HCV Quantitative: NOT DETECTED IU/mL (ref <15)

## 2016-04-30 LAB — HEPATIC FUNCTION PANEL
ALK PHOS: 54 U/L (ref 40–115)
ALT: 18 U/L (ref 9–46)
AST: 16 U/L (ref 10–35)
Albumin: 3.9 g/dL (ref 3.6–5.1)
BILIRUBIN DIRECT: 0.1 mg/dL (ref <=0.2)
BILIRUBIN INDIRECT: 0.3 mg/dL (ref 0.2–1.2)
Total Bilirubin: 0.4 mg/dL (ref 0.2–1.2)
Total Protein: 6.3 g/dL (ref 6.1–8.1)

## 2016-04-30 LAB — AFP TUMOR MARKER: AFP TUMOR MARKER: 2.7 ng/mL (ref <6.1)

## 2016-04-30 LAB — HEPATITIS C ANTIBODY: HCV AB: REACTIVE — AB

## 2016-05-02 LAB — DRUG ABUSE 10-50+ETHANOL NO CONF, U
ALCOHOL, ETHYL (U): NEGATIVE
AMPHETAMINES (1000 NG/ML SCRN): NEGATIVE
BARBITURATES: NEGATIVE
BENZODIAZEPINES: NEGATIVE
COCAINE METABOLITES: POSITIVE — AB
MARIJUANA MET (50 ng/mL SCRN): NEGATIVE
METHADONE: NEGATIVE
METHAQUALONE: NEGATIVE
OPIATES: NEGATIVE
PHENCYCLIDINE: NEGATIVE
PROPOXYPHENE: NEGATIVE

## 2016-05-03 LAB — HEPATITIS C GENOTYPE: HCV Genotype: NOT DETECTED

## 2016-05-04 ENCOUNTER — Ambulatory Visit (HOSPITAL_COMMUNITY)
Admission: RE | Admit: 2016-05-04 | Discharge: 2016-05-04 | Disposition: A | Discharge status: Home / Self Care | Payer: Medicare Other | Source: Ambulatory Visit | Attending: Internal Medicine | Admitting: Internal Medicine

## 2016-05-04 DIAGNOSIS — K760 Fatty (change of) liver, not elsewhere classified: Secondary | ICD-10-CM | POA: Diagnosis not present

## 2016-05-04 DIAGNOSIS — B1920 Unspecified viral hepatitis C without hepatic coma: Secondary | ICD-10-CM | POA: Diagnosis not present

## 2016-05-28 DIAGNOSIS — Z0000 Encounter for general adult medical examination without abnormal findings: Secondary | ICD-10-CM | POA: Diagnosis not present

## 2016-05-28 DIAGNOSIS — Z1389 Encounter for screening for other disorder: Secondary | ICD-10-CM | POA: Diagnosis not present

## 2016-05-28 DIAGNOSIS — K210 Gastro-esophageal reflux disease with esophagitis: Secondary | ICD-10-CM | POA: Diagnosis not present

## 2016-05-28 DIAGNOSIS — E1142 Type 2 diabetes mellitus with diabetic polyneuropathy: Secondary | ICD-10-CM | POA: Diagnosis not present

## 2016-05-28 DIAGNOSIS — I10 Essential (primary) hypertension: Secondary | ICD-10-CM | POA: Diagnosis not present

## 2016-05-28 DIAGNOSIS — E784 Other hyperlipidemia: Secondary | ICD-10-CM | POA: Diagnosis not present

## 2016-08-27 DIAGNOSIS — I10 Essential (primary) hypertension: Secondary | ICD-10-CM | POA: Diagnosis not present

## 2016-08-27 DIAGNOSIS — K210 Gastro-esophageal reflux disease with esophagitis: Secondary | ICD-10-CM | POA: Diagnosis not present

## 2016-08-27 DIAGNOSIS — E1142 Type 2 diabetes mellitus with diabetic polyneuropathy: Secondary | ICD-10-CM | POA: Diagnosis not present

## 2016-08-27 DIAGNOSIS — E784 Other hyperlipidemia: Secondary | ICD-10-CM | POA: Diagnosis not present

## 2016-12-07 DIAGNOSIS — K210 Gastro-esophageal reflux disease with esophagitis: Secondary | ICD-10-CM | POA: Diagnosis not present

## 2016-12-07 DIAGNOSIS — J208 Acute bronchitis due to other specified organisms: Secondary | ICD-10-CM | POA: Diagnosis not present

## 2016-12-07 DIAGNOSIS — E1142 Type 2 diabetes mellitus with diabetic polyneuropathy: Secondary | ICD-10-CM | POA: Diagnosis not present

## 2016-12-07 DIAGNOSIS — E784 Other hyperlipidemia: Secondary | ICD-10-CM | POA: Diagnosis not present

## 2016-12-07 DIAGNOSIS — I10 Essential (primary) hypertension: Secondary | ICD-10-CM | POA: Diagnosis not present

## 2017-04-08 DIAGNOSIS — Z79899 Other long term (current) drug therapy: Secondary | ICD-10-CM | POA: Diagnosis not present

## 2017-04-08 DIAGNOSIS — I509 Heart failure, unspecified: Secondary | ICD-10-CM | POA: Diagnosis not present

## 2017-04-08 DIAGNOSIS — I252 Old myocardial infarction: Secondary | ICD-10-CM | POA: Diagnosis not present

## 2017-04-08 DIAGNOSIS — I2510 Atherosclerotic heart disease of native coronary artery without angina pectoris: Secondary | ICD-10-CM | POA: Diagnosis not present

## 2017-04-08 DIAGNOSIS — Z7984 Long term (current) use of oral hypoglycemic drugs: Secondary | ICD-10-CM | POA: Diagnosis not present

## 2017-04-08 DIAGNOSIS — L237 Allergic contact dermatitis due to plants, except food: Secondary | ICD-10-CM | POA: Diagnosis not present

## 2017-04-08 DIAGNOSIS — J45909 Unspecified asthma, uncomplicated: Secondary | ICD-10-CM | POA: Diagnosis not present

## 2017-04-08 DIAGNOSIS — M79602 Pain in left arm: Secondary | ICD-10-CM | POA: Diagnosis not present

## 2017-04-08 DIAGNOSIS — R079 Chest pain, unspecified: Secondary | ICD-10-CM | POA: Diagnosis not present

## 2017-04-08 DIAGNOSIS — Z8619 Personal history of other infectious and parasitic diseases: Secondary | ICD-10-CM | POA: Diagnosis not present

## 2017-04-08 DIAGNOSIS — Z7982 Long term (current) use of aspirin: Secondary | ICD-10-CM | POA: Diagnosis not present

## 2017-04-08 DIAGNOSIS — E785 Hyperlipidemia, unspecified: Secondary | ICD-10-CM | POA: Diagnosis not present

## 2017-04-08 DIAGNOSIS — S91331A Puncture wound without foreign body, right foot, initial encounter: Secondary | ICD-10-CM | POA: Diagnosis not present

## 2017-04-08 DIAGNOSIS — Z23 Encounter for immunization: Secondary | ICD-10-CM | POA: Diagnosis not present

## 2017-06-14 DIAGNOSIS — I10 Essential (primary) hypertension: Secondary | ICD-10-CM | POA: Diagnosis not present

## 2017-06-14 DIAGNOSIS — E1142 Type 2 diabetes mellitus with diabetic polyneuropathy: Secondary | ICD-10-CM | POA: Diagnosis not present

## 2017-06-14 DIAGNOSIS — E784 Other hyperlipidemia: Secondary | ICD-10-CM | POA: Diagnosis not present

## 2017-06-14 DIAGNOSIS — K210 Gastro-esophageal reflux disease with esophagitis: Secondary | ICD-10-CM | POA: Diagnosis not present

## 2017-06-14 DIAGNOSIS — Z0000 Encounter for general adult medical examination without abnormal findings: Secondary | ICD-10-CM | POA: Diagnosis not present

## 2017-07-13 DIAGNOSIS — E1142 Type 2 diabetes mellitus with diabetic polyneuropathy: Secondary | ICD-10-CM | POA: Diagnosis not present

## 2017-07-13 DIAGNOSIS — Z125 Encounter for screening for malignant neoplasm of prostate: Secondary | ICD-10-CM | POA: Diagnosis not present

## 2017-07-13 DIAGNOSIS — I10 Essential (primary) hypertension: Secondary | ICD-10-CM | POA: Diagnosis not present

## 2017-07-13 DIAGNOSIS — E784 Other hyperlipidemia: Secondary | ICD-10-CM | POA: Diagnosis not present

## 2017-07-14 DIAGNOSIS — E11319 Type 2 diabetes mellitus with unspecified diabetic retinopathy without macular edema: Secondary | ICD-10-CM | POA: Diagnosis not present

## 2017-09-15 DIAGNOSIS — K210 Gastro-esophageal reflux disease with esophagitis: Secondary | ICD-10-CM | POA: Diagnosis not present

## 2017-09-15 DIAGNOSIS — E7849 Other hyperlipidemia: Secondary | ICD-10-CM | POA: Diagnosis not present

## 2017-09-15 DIAGNOSIS — E1142 Type 2 diabetes mellitus with diabetic polyneuropathy: Secondary | ICD-10-CM | POA: Diagnosis not present

## 2017-09-15 DIAGNOSIS — I10 Essential (primary) hypertension: Secondary | ICD-10-CM | POA: Diagnosis not present

## 2018-02-08 DIAGNOSIS — I2510 Atherosclerotic heart disease of native coronary artery without angina pectoris: Secondary | ICD-10-CM | POA: Diagnosis not present

## 2018-02-08 DIAGNOSIS — N200 Calculus of kidney: Secondary | ICD-10-CM | POA: Diagnosis not present

## 2018-02-08 DIAGNOSIS — N132 Hydronephrosis with renal and ureteral calculous obstruction: Secondary | ICD-10-CM | POA: Diagnosis not present

## 2018-02-08 DIAGNOSIS — I252 Old myocardial infarction: Secondary | ICD-10-CM | POA: Diagnosis not present

## 2018-02-08 DIAGNOSIS — Z87891 Personal history of nicotine dependence: Secondary | ICD-10-CM | POA: Diagnosis not present

## 2018-02-08 DIAGNOSIS — E785 Hyperlipidemia, unspecified: Secondary | ICD-10-CM | POA: Diagnosis not present

## 2018-02-08 DIAGNOSIS — K219 Gastro-esophageal reflux disease without esophagitis: Secondary | ICD-10-CM | POA: Diagnosis not present

## 2018-02-08 DIAGNOSIS — I509 Heart failure, unspecified: Secondary | ICD-10-CM | POA: Diagnosis not present

## 2018-02-08 DIAGNOSIS — Z79899 Other long term (current) drug therapy: Secondary | ICD-10-CM | POA: Diagnosis not present

## 2018-02-08 DIAGNOSIS — Z7984 Long term (current) use of oral hypoglycemic drugs: Secondary | ICD-10-CM | POA: Diagnosis not present

## 2018-02-08 DIAGNOSIS — R109 Unspecified abdominal pain: Secondary | ICD-10-CM | POA: Diagnosis not present

## 2018-02-08 DIAGNOSIS — K5900 Constipation, unspecified: Secondary | ICD-10-CM | POA: Diagnosis not present

## 2018-02-09 DIAGNOSIS — N132 Hydronephrosis with renal and ureteral calculous obstruction: Secondary | ICD-10-CM | POA: Diagnosis not present

## 2018-02-16 DIAGNOSIS — E7849 Other hyperlipidemia: Secondary | ICD-10-CM | POA: Diagnosis not present

## 2018-02-16 DIAGNOSIS — I10 Essential (primary) hypertension: Secondary | ICD-10-CM | POA: Diagnosis not present

## 2018-02-16 DIAGNOSIS — N200 Calculus of kidney: Secondary | ICD-10-CM | POA: Diagnosis not present

## 2018-02-16 DIAGNOSIS — K210 Gastro-esophageal reflux disease with esophagitis: Secondary | ICD-10-CM | POA: Diagnosis not present

## 2018-02-16 DIAGNOSIS — E1142 Type 2 diabetes mellitus with diabetic polyneuropathy: Secondary | ICD-10-CM | POA: Diagnosis not present

## 2018-02-22 ENCOUNTER — Other Ambulatory Visit: Payer: Self-pay | Admitting: Urology

## 2018-02-22 ENCOUNTER — Encounter (HOSPITAL_COMMUNITY): Payer: Self-pay | Admitting: *Deleted

## 2018-02-22 DIAGNOSIS — R1084 Generalized abdominal pain: Secondary | ICD-10-CM | POA: Diagnosis not present

## 2018-02-22 DIAGNOSIS — N201 Calculus of ureter: Secondary | ICD-10-CM | POA: Diagnosis not present

## 2018-02-23 ENCOUNTER — Other Ambulatory Visit: Payer: Self-pay

## 2018-02-23 ENCOUNTER — Encounter (HOSPITAL_COMMUNITY): Payer: Self-pay | Admitting: *Deleted

## 2018-02-23 NOTE — Progress Notes (Signed)
LOV-02/16/18-PCP-Dr Hasanj on chart BMP, HGBA1c-02/16/2018-on chart  ECHO-06/17/15- on chart  LOV- Dr Tobi BastosKoneswaran-epic-2017 12/115/15-ekg-epic 11/05/14-ECHO-epic

## 2018-02-23 NOTE — Progress Notes (Signed)
Spoke with Dr Krista BlueSinger( anesthesia) regarding patient history of Mi with stent in 2011 with recath in 2012 with no changes.  DR Krista BlueSinger aware of ECHO done 2016 and saw report and LOV from PCP ( Dr Camelia EngHasanj).  Aware patient has not been seen by cardiologist since 2017- klast note in epic.  Patient reported at time of preop history via phone that he had chest pain 2 weeks ago and took Nitroglycerin which he has a prescription for per patient.  Dr Barbee CoughSinger awawre of all of above and no new orders given.

## 2018-02-23 NOTE — Progress Notes (Addendum)
Called and requested LOV note , ekg, and labs from PCP done 02/16/2018.  They are to fax.  [facility]. Pharmacy aware of contact phone number for patient and that I have a copy of meds on chart. After 4 attemtps obtained LOV note, labs and echo from 2016 and placed on chart.

## 2018-02-24 ENCOUNTER — Ambulatory Visit (HOSPITAL_COMMUNITY): Payer: Medicare Other | Admitting: Anesthesiology

## 2018-02-24 ENCOUNTER — Ambulatory Visit (HOSPITAL_COMMUNITY): Payer: Medicare Other

## 2018-02-24 ENCOUNTER — Other Ambulatory Visit: Payer: Self-pay

## 2018-02-24 ENCOUNTER — Encounter (HOSPITAL_COMMUNITY)
Admission: RE | Disposition: A | Discharge status: Home / Self Care | Payer: Self-pay | Source: Ambulatory Visit | Attending: Urology

## 2018-02-24 ENCOUNTER — Encounter (HOSPITAL_COMMUNITY): Payer: Self-pay | Admitting: *Deleted

## 2018-02-24 ENCOUNTER — Ambulatory Visit (HOSPITAL_COMMUNITY)
Admission: RE | Admit: 2018-02-24 | Discharge: 2018-02-24 | Disposition: A | Discharge status: Home / Self Care | Payer: Medicare Other | Source: Ambulatory Visit | Attending: Urology | Admitting: Urology

## 2018-02-24 DIAGNOSIS — N200 Calculus of kidney: Secondary | ICD-10-CM | POA: Diagnosis not present

## 2018-02-24 DIAGNOSIS — I2510 Atherosclerotic heart disease of native coronary artery without angina pectoris: Secondary | ICD-10-CM | POA: Diagnosis not present

## 2018-02-24 DIAGNOSIS — K219 Gastro-esophageal reflux disease without esophagitis: Secondary | ICD-10-CM | POA: Diagnosis not present

## 2018-02-24 DIAGNOSIS — E7800 Pure hypercholesterolemia, unspecified: Secondary | ICD-10-CM | POA: Diagnosis not present

## 2018-02-24 DIAGNOSIS — Z955 Presence of coronary angioplasty implant and graft: Secondary | ICD-10-CM | POA: Diagnosis not present

## 2018-02-24 DIAGNOSIS — E1151 Type 2 diabetes mellitus with diabetic peripheral angiopathy without gangrene: Secondary | ICD-10-CM | POA: Diagnosis not present

## 2018-02-24 DIAGNOSIS — Z87891 Personal history of nicotine dependence: Secondary | ICD-10-CM | POA: Insufficient documentation

## 2018-02-24 DIAGNOSIS — Z7984 Long term (current) use of oral hypoglycemic drugs: Secondary | ICD-10-CM | POA: Insufficient documentation

## 2018-02-24 DIAGNOSIS — I252 Old myocardial infarction: Secondary | ICD-10-CM | POA: Diagnosis not present

## 2018-02-24 DIAGNOSIS — J45909 Unspecified asthma, uncomplicated: Secondary | ICD-10-CM | POA: Diagnosis not present

## 2018-02-24 DIAGNOSIS — N132 Hydronephrosis with renal and ureteral calculous obstruction: Secondary | ICD-10-CM | POA: Diagnosis not present

## 2018-02-24 DIAGNOSIS — I5042 Chronic combined systolic (congestive) and diastolic (congestive) heart failure: Secondary | ICD-10-CM | POA: Diagnosis not present

## 2018-02-24 DIAGNOSIS — F329 Major depressive disorder, single episode, unspecified: Secondary | ICD-10-CM | POA: Diagnosis not present

## 2018-02-24 DIAGNOSIS — Z8249 Family history of ischemic heart disease and other diseases of the circulatory system: Secondary | ICD-10-CM | POA: Diagnosis not present

## 2018-02-24 DIAGNOSIS — Z807 Family history of other malignant neoplasms of lymphoid, hematopoietic and related tissues: Secondary | ICD-10-CM | POA: Diagnosis not present

## 2018-02-24 DIAGNOSIS — I10 Essential (primary) hypertension: Secondary | ICD-10-CM | POA: Diagnosis not present

## 2018-02-24 DIAGNOSIS — Z79899 Other long term (current) drug therapy: Secondary | ICD-10-CM | POA: Insufficient documentation

## 2018-02-24 DIAGNOSIS — N201 Calculus of ureter: Secondary | ICD-10-CM | POA: Diagnosis not present

## 2018-02-24 DIAGNOSIS — F419 Anxiety disorder, unspecified: Secondary | ICD-10-CM | POA: Diagnosis not present

## 2018-02-24 DIAGNOSIS — I110 Hypertensive heart disease with heart failure: Secondary | ICD-10-CM | POA: Diagnosis not present

## 2018-02-24 HISTORY — PX: CYSTOSCOPY/URETEROSCOPY/HOLMIUM LASER/STENT PLACEMENT: SHX6546

## 2018-02-24 HISTORY — DX: Gastro-esophageal reflux disease without esophagitis: K21.9

## 2018-02-24 HISTORY — DX: Unspecified osteoarthritis, unspecified site: M19.90

## 2018-02-24 HISTORY — DX: Personal history of urinary calculi: Z87.442

## 2018-02-24 HISTORY — DX: Acute myocardial infarction, unspecified: I21.9

## 2018-02-24 HISTORY — DX: Type 2 diabetes mellitus without complications: E11.9

## 2018-02-24 LAB — COMPREHENSIVE METABOLIC PANEL
ALBUMIN: 3.8 g/dL (ref 3.5–5.0)
ALT: 23 U/L (ref 17–63)
ANION GAP: 9 (ref 5–15)
AST: 22 U/L (ref 15–41)
Alkaline Phosphatase: 62 U/L (ref 38–126)
BILIRUBIN TOTAL: 0.9 mg/dL (ref 0.3–1.2)
BUN: 14 mg/dL (ref 6–20)
CALCIUM: 8.8 mg/dL — AB (ref 8.9–10.3)
CO2: 23 mmol/L (ref 22–32)
Chloride: 106 mmol/L (ref 101–111)
Creatinine, Ser: 1.1 mg/dL (ref 0.61–1.24)
GFR calc Af Amer: >60 mL/min (ref >60)
GFR calc non Af Amer: >60 mL/min (ref >60)
GLUCOSE: 94 mg/dL (ref 65–99)
Potassium: 3.6 mmol/L (ref 3.5–5.1)
Sodium: 138 mmol/L (ref 135–145)
TOTAL PROTEIN: 6.7 g/dL (ref 6.5–8.1)

## 2018-02-24 LAB — CBC
HCT: 47.2 % (ref 39.0–52.0)
Hemoglobin: 15.5 g/dL (ref 13.0–17.0)
MCH: 30.5 pg (ref 26.0–34.0)
MCHC: 32.8 g/dL (ref 30.0–36.0)
MCV: 92.9 fL (ref 78.0–100.0)
Platelets: 210 10*3/uL (ref 150–400)
RBC: 5.08 MIL/uL (ref 4.22–5.81)
RDW: 13.4 % (ref 11.5–15.5)
WBC: 6.5 10*3/uL (ref 4.0–10.5)

## 2018-02-24 LAB — GLUCOSE, CAPILLARY
GLUCOSE-CAPILLARY: 82 mg/dL (ref 65–99)
GLUCOSE-CAPILLARY: 100 mg/dL — AB (ref 65–99)

## 2018-02-24 SURGERY — CYSTOSCOPY/URETEROSCOPY/HOLMIUM LASER/STENT PLACEMENT
Anesthesia: General | Laterality: Right

## 2018-02-24 MED ORDER — ONDANSETRON HCL 4 MG/2ML IJ SOLN
INTRAMUSCULAR | Status: DC | PRN
  Administered 2018-02-24: 4 mg via INTRAVENOUS

## 2018-02-24 MED ORDER — TAMSULOSIN HCL 0.4 MG PO CAPS: 0.4000 mg | ORAL_CAPSULE | Freq: Every day | ORAL | Status: DC

## 2018-02-24 MED ORDER — CEFAZOLIN SODIUM-DEXTROSE 2-4 GM/100ML-% IV SOLN
2.0000 g | INTRAVENOUS | Status: AC
  Administered 2018-02-24: 2 g via INTRAVENOUS
  Filled 2018-02-24: qty 100

## 2018-02-24 MED ORDER — PROPOFOL 10 MG/ML IV BOLUS
INTRAVENOUS | Status: DC | PRN
  Administered 2018-02-24: 200 mg via INTRAVENOUS

## 2018-02-24 MED ORDER — PROPOFOL 10 MG/ML IV BOLUS
INTRAVENOUS | Status: AC
  Filled 2018-02-24: qty 20

## 2018-02-24 MED ORDER — LIDOCAINE 2% (20 MG/ML) 5 ML SYRINGE
INTRAMUSCULAR | Status: AC
  Filled 2018-02-24: qty 5

## 2018-02-24 MED ORDER — LACTATED RINGERS IV SOLN
INTRAVENOUS | Status: DC
  Administered 2018-02-24: 12:00:00 via INTRAVENOUS

## 2018-02-24 MED ORDER — IOHEXOL 300 MG/ML  SOLN
INTRAMUSCULAR | Status: DC | PRN
  Administered 2018-02-24: 20 mL

## 2018-02-24 MED ORDER — DEXAMETHASONE SODIUM PHOSPHATE 10 MG/ML IJ SOLN
INTRAMUSCULAR | Status: DC | PRN
  Administered 2018-02-24: 10 mg via INTRAVENOUS

## 2018-02-24 MED ORDER — ETOMIDATE 2 MG/ML IV SOLN
INTRAVENOUS | Status: AC
  Filled 2018-02-24: qty 10

## 2018-02-24 MED ORDER — SODIUM CHLORIDE 0.9 % IR SOLN
Status: DC | PRN
  Administered 2018-02-24: 6000 mL

## 2018-02-24 MED ORDER — OXYBUTYNIN CHLORIDE 5 MG PO TABS: 5.0000 mg | ORAL_TABLET | Freq: Three times a day (TID) | ORAL | Status: DC | PRN

## 2018-02-24 MED ORDER — OXYBUTYNIN CHLORIDE 5 MG PO TABS: 5.0000 mg | ORAL_TABLET | Freq: Three times a day (TID) | ORAL | 3 refills | Status: DC | PRN

## 2018-02-24 MED ORDER — DEXAMETHASONE SODIUM PHOSPHATE 10 MG/ML IJ SOLN
INTRAMUSCULAR | Status: AC
  Filled 2018-02-24: qty 1

## 2018-02-24 MED ORDER — FENTANYL CITRATE (PF) 100 MCG/2ML IJ SOLN
INTRAMUSCULAR | Status: DC | PRN
  Administered 2018-02-24 (×2): 50 ug via INTRAVENOUS

## 2018-02-24 MED ORDER — ONDANSETRON HCL 4 MG/2ML IJ SOLN
INTRAMUSCULAR | Status: AC
  Filled 2018-02-24: qty 2

## 2018-02-24 MED ORDER — TAMSULOSIN HCL 0.4 MG PO CAPS: 0.4000 mg | ORAL_CAPSULE | Freq: Every day | ORAL | 3 refills | Status: DC

## 2018-02-24 MED ORDER — OXYCODONE-ACETAMINOPHEN 5-325 MG PO TABS: 1.0000 | ORAL_TABLET | ORAL | 0 refills | Status: DC | PRN

## 2018-02-24 MED ORDER — FENTANYL CITRATE (PF) 100 MCG/2ML IJ SOLN
INTRAMUSCULAR | Status: AC
  Filled 2018-02-24: qty 2

## 2018-02-24 MED ORDER — ETOMIDATE 2 MG/ML IV SOLN
INTRAVENOUS | Status: DC | PRN
  Administered 2018-02-24: 20 mg via INTRAVENOUS

## 2018-02-24 SURGICAL SUPPLY — 24 items
BAG URO CATCHER STRL LF (MISCELLANEOUS) ×3 IMPLANT
BASKET LASER NITINOL 1.9FR (BASKET) IMPLANT
BASKET ZERO TIP NITINOL 2.4FR (BASKET) ×2 IMPLANT
BSKT STON RTRVL 120 1.9FR (BASKET)
BSKT STON RTRVL ZERO TP 2.4FR (BASKET) ×1
CATH INTERMIT  6FR 70CM (CATHETERS) ×2 IMPLANT
CATH URET 5FR 28IN CONE TIP (BALLOONS)
CATH URET 5FR 70CM CONE TIP (BALLOONS) IMPLANT
CLOTH BEACON ORANGE TIMEOUT ST (SAFETY) ×3 IMPLANT
COVER FOOTSWITCH UNIV (MISCELLANEOUS) ×3 IMPLANT
FIBER LASER FLEXIVA 365 (UROLOGICAL SUPPLIES) IMPLANT
FIBER LASER TRAC TIP (UROLOGICAL SUPPLIES) ×2 IMPLANT
GLOVE BIOGEL M STRL SZ7.5 (GLOVE) ×3 IMPLANT
GOWN STRL REUS W/TWL XL LVL3 (GOWN DISPOSABLE) ×3 IMPLANT
GUIDEWIRE ANG ZIPWIRE 038X150 (WIRE) IMPLANT
GUIDEWIRE STR DUAL SENSOR (WIRE) ×3 IMPLANT
INFUSOR MANOMETER BAG 3000ML (MISCELLANEOUS) IMPLANT
MANIFOLD NEPTUNE II (INSTRUMENTS) ×3 IMPLANT
PACK CYSTO (CUSTOM PROCEDURE TRAY) ×3 IMPLANT
SHEATH ACCESS URETERAL 24CM (SHEATH) IMPLANT
SHEATH ACCESS URETERAL 54CM (SHEATH) IMPLANT
SHEATH URETERAL 12FRX35CM (MISCELLANEOUS) IMPLANT
STENT URET 6FRX26 CONTOUR (STENTS) ×2 IMPLANT
TUBING UROLOGY SET (TUBING) ×3 IMPLANT

## 2018-02-24 NOTE — Anesthesia Preprocedure Evaluation (Signed)
Anesthesia Evaluation  Patient identified by MRN, date of birth, ID band Patient awake    Reviewed: Allergy & Precautions, H&P , NPO status , Patient's Chart, lab work & pertinent test results  Airway Mallampati: II   Neck ROM: full    Dental   Pulmonary asthma , former smoker,    breath sounds clear to auscultation       Cardiovascular hypertension, + CAD, + Past MI, + Cardiac Stents, + Peripheral Vascular Disease and +CHF   Rhythm:regular Rate:Normal  EF 45%   Neuro/Psych PSYCHIATRIC DISORDERS Depression    GI/Hepatic GERD  ,(+)     substance abuse  , Hepatitis -, C  Endo/Other  diabetes, Type 2  Renal/GU      Musculoskeletal  (+) Arthritis ,   Abdominal   Peds  Hematology   Anesthesia Other Findings   Reproductive/Obstetrics                             Anesthesia Physical Anesthesia Plan  ASA: III  Anesthesia Plan: General   Post-op Pain Management:    Induction: Intravenous  PONV Risk Score and Plan: 2 and Ondansetron, Treatment may vary due to age or medical condition, Midazolam and Dexamethasone  Airway Management Planned: LMA  Additional Equipment:   Intra-op Plan:   Post-operative Plan:   Informed Consent: I have reviewed the patients History and Physical, chart, labs and discussed the procedure including the risks, benefits and alternatives for the proposed anesthesia with the patient or authorized representative who has indicated his/her understanding and acceptance.     Plan Discussed with: CRNA, Anesthesiologist and Surgeon  Anesthesia Plan Comments:         Anesthesia Quick Evaluation

## 2018-02-24 NOTE — Transfer of Care (Signed)
Immediate Anesthesia Transfer of Care Note  Patient: Angel Donovan  Procedure(s) Performed: RIGHT URETEROSCOPY HOLMIUM LASER STENT PLACEMENT RETROGRADE PYELOGRAM (Right )  Patient Location: PACU  Anesthesia Type:General  Level of Consciousness: sedated  Airway & Oxygen Therapy: Patient Spontanous Breathing and Patient connected to face mask oxygen  Post-op Assessment: Report given to RN and Post -op Vital signs reviewed and stable  Post vital signs: Reviewed and stable  Last Vitals:  Vitals Value Taken Time  BP    Temp    Pulse    Resp    SpO2      Last Pain:  Vitals:   02/24/18 1152  TempSrc:   PainSc: 0-No pain      Patients Stated Pain Goal: 5 (02/24/18 1152)  Complications: No apparent anesthesia complications

## 2018-02-24 NOTE — H&P (Signed)
CC: I have kidney stones.  HPI: Angel Donovan is a 52 year-old male patient who was referred by Lia Hopping who is here for renal calculi.  The problem is on the right side. He first stated noticing pain on 02/09/2018. He is currently having flank pain and back pain. He denies having groin pain, nausea, vomiting, fever, and chills. He has not caught a stone in his urine strainer since his symptoms began.   The patient went to the emergency department on 02/09/2018. This was for right-sided flank pain. CT of the abdomen and pelvis was performed which revealed a 5 x 8 x 6 right ureteropelvic junction stone causing mild right-sided hydronephrosis. He continues to have some flank pain. No fever nausea or vomiting.   Patient also complains of erectile dysfunction. He has a difficult time obtaining a full erection and maintaining. He does take nitrates for chest pain.   ALLERGIES: None   MEDICATIONS: Metformin Hcl 500 mg tablet  Albuterol Sulfate  Amlodipine Besylate 5 mg tablet  Atorvastatin Calcium 40 mg tablet  Benazepril Hcl 40 mg tablet  Carvedilol 12.5 mg tablet  Chlorthalidone 25 mg tablet  Famotidine 20 mg tablet  Nitroglycerin  Tizanidine Hcl 4 mg capsule    GU PSH: None   NON-GU PSH: Cardiac Stent Placement   GU PMH: None   NON-GU PMH: Anxiety Arthritis Depression Diabetes Type 2 GERD Hypercholesterolemia Hypertension Myocardial Infarction Other heart failure   FAMILY HISTORY: 1 Daughter - Other 1 son - Other Diabetes - Mother heart failure - Father lymphoma - Father   SOCIAL HISTORY: Marital Status: Married Preferred Language: English; Race: White Current Smoking Status: Patient does not smoke anymore.   Tobacco Use Assessment Completed:  Used Tobacco in last 30 days?  Drinks 4+ caffeinated drinks per day.   REVIEW OF SYSTEMS:    GU Review Male:   Patient reports stream starts and stops, trouble starting your stream, have to strain to urinate , and erection  problems. Patient denies frequent urination, hard to postpone urination, burning/ pain with urination, get up at night to urinate, leakage of urine, and penile pain.  Gastrointestinal (Upper):   Patient reports nausea and vomiting. Patient denies indigestion/ heartburn.  Gastrointestinal (Lower):   Patient reports constipation. Patient denies diarrhea.  Constitutional:   Patient reports fatigue. Patient denies fever, night sweats, and weight loss.  Skin:   Patient denies skin rash/ lesion and itching.  Eyes:   Patient reports blurred vision. Patient denies double vision.  Ears/ Nose/ Throat:   Patient denies sore throat and sinus problems.  Hematologic/Lymphatic:   Patient denies swollen glands and easy bruising.  Cardiovascular:   Patient reports leg swelling and chest pains.   Respiratory:   Patient reports shortness of breath. Patient denies cough.  Endocrine:   Patient reports excessive thirst.   Musculoskeletal:   Patient reports back pain and joint pain.   Neurological:   Patient reports dizziness. Patient denies headaches.  Psychologic:   Patient reports depression and anxiety.    Notes: blood in urine weak stream   VITAL SIGNS:      02/22/2018 09:32 AM  Weight 210 lb / 95.25 kg  Height 70 in / 177.8 cm  BP 131/75 mmHg  Heart Rate 72 /min  Temperature 97.3 F / 36.2 C  BMI 30.1 kg/m   MULTI-SYSTEM PHYSICAL EXAMINATION:    Constitutional: Well-nourished. No physical deformities. Normally developed. Good grooming.  Neck: Neck symmetrical, not swollen. Normal tracheal position.  Respiratory: No  labored breathing, no use of accessory muscles. Normal breath sounds.   Cardiovascular: Normal temperature, adequate perfusion of extremities  Skin: No paleness, no jaundice  Neurologic / Psychiatric: Oriented to time, oriented to place, oriented to person. No depression, no anxiety, no agitation.  Gastrointestinal: No mass, no tenderness, no rigidity, non obese abdomen. No CVA tenderness  bilaterally  Eyes: Normal conjunctivae. Normal eyelids.  Musculoskeletal: Normal gait and station of head and neck.    PAST DATA REVIEWED:  Source Of History:  Patient   PROCEDURES:         KUB - [id number]  A single view of the abdomen is obtained.  Persistent right ureteral calculus is redemonstrated.              Urinalysis Dipstick Dipstick Cont'd  Color: Yellow Bilirubin: Neg  Appearance: Clear Ketones: Neg  Specific Gravity: 1.020 Blood: Neg  pH: <=5.0 Protein: Neg  Glucose: Neg Urobilinogen: 0.2    Nitrites: Neg    Leukocyte Esterase: Neg   ASSESSMENT:      ICD-10 Details  1 GU:   Ureteral calculus - N20.1   2   Flank Pain - R10.84   3   ED due to arterial insufficiency - N52.01    PLAN:           Medications New Meds: Oxycodone-Acetaminophen 5 mg-325 mg tablet 1 tablet PO Q 6 H   #14  0 Refill(s)          Orders Labs Urine Culture  X-Rays: KUB         Schedule          Document Letter(s):  Created for Patient: Clinical Summary        Notes:   We discussed the management of urinary stones. These options include observation, ureteroscopy, and shockwave lithotripsy. We discussed which options are relevant to these particular stones. We discussed the natural history of stones as well as the complications of untreated stones and the impact on quality of life without treatment as well as with each of the above listed treatments. We also discussed the efficacy of each treatment in its ability to clear the stone burden. With any of these management options I discussed the signs and symptoms of infection and the need for emergent treatment should these be experienced. For each option we discussed the ability of each procedure to clear the patient of their stone burden.   For observation I described the risks which include but are not limited to silent renal damage, life-threatening infection, need for emergent surgery, failure to pass stone, and pain.   For ureteroscopy I  described the risks which include heart attack, stroke, pulmonary embolus, death, bleeding, infection, damage to contiguous structures, positioning injury, ureteral stricture, ureteral avulsion, ureteral injury, need for ureteral stent, inability to perform ureteroscopy, need for an interval procedure, inability to clear stone burden, stent discomfort and pain.   For shockwave lithotripsy I described the risks which include arrhythmia, kidney contusion, kidney hemorrhage, need for transfusion, pain, inability to break up stone, inability to pass stone fragments, Steinstrasse, infection associated with obstructing stones, need for different surgical procedure, need for repeat shockwave lithotripsy, and death.   He would like to proceed with either ESWL or ureteroscopy, which ever can be done sooner. He did take Aleve yesterday evening and therefore is not eligible for ESWL tomorrow. We will get him on the schedule ASAP.   Given continued pain, also provided a prescription for Percocet.   For  erectile dysfunction, he is not eligible for oral PDE 5 inhibitors given that he is on nitroglycerin. We talked about the potential of intracorporeal injections. He is not interested currently.   Signed by Modena Slater, III, M.D. on 02/22/18 at 10:08 AM (EDT

## 2018-02-24 NOTE — Anesthesia Postprocedure Evaluation (Signed)
Anesthesia Post Note  Patient: Angel JewelDennis L Donovan  Procedure(s) Performed: RIGHT URETEROSCOPY HOLMIUM LASER STENT PLACEMENT RETROGRADE PYELOGRAM (Right )     Patient location during evaluation: PACU Anesthesia Type: General Level of consciousness: awake and alert Pain management: pain level controlled Vital Signs Assessment: post-procedure vital signs reviewed and stable Respiratory status: spontaneous breathing, nonlabored ventilation, respiratory function stable and patient connected to nasal cannula oxygen Cardiovascular status: blood pressure returned to baseline and stable Postop Assessment: no apparent nausea or vomiting Anesthetic complications: no    Last Vitals:  Vitals:   02/24/18 1410 02/24/18 1415  BP: 110/68 105/80  Pulse: 62 61  Resp: 10 11  Temp: 36.4 C   SpO2: 100% 100%    Last Pain:  Vitals:   02/24/18 1410  TempSrc: Axillary  PainSc: Asleep                 Jahree Dermody S

## 2018-02-24 NOTE — Discharge Instructions (Addendum)
[facility] [phone] Post Ureteroscopy With or Without Stent Instructions  Definitions:  Ureter: The duct that transports urine from the kidney to the bladder. Stent:   A plastic hollow tube that is placed into the ureter, from the kidney to the bladder to prevent the ureter from swelling shut.  GENERAL INSTRUCTIONS:  Despite the fact that no skin incisions were used, the area around the ureter and bladder is raw and irritated. The stent is a foreign body which will further irritate the bladder wall. This irritation is manifested by increased frequency of urination, both day and night, and by an increase in the urge to urinate. In some, the urge to urinate is present almost always. Sometimes the urge is strong enough that you may not be able to stop yourself from urinating. The only real cure is to remove the stent and then give time for the bladder wall to heal which can't be done until the danger of the ureter swelling shut has passed, which varies.  You may see some blood in your urine while the stent is in place and a few days afterwards. Do not be alarmed, even if the urine was clear for a while. Get off your feet and drink lots of fluids until clearing occurs. If you start to pass clots or don't improve, call us.  DIET: You may return to your normal diet immediately. Because of the raw surface of your bladder, alcohol, spicy foods, acid type foods and drinks with caffeine may cause irritation or frequency and should be used in moderation. To keep your urine flowing freely and to avoid constipation, drink plenty of fluids during the day ( 8-10 glasses ). Tip: Avoid cranberry juice because it is very acidic.  ACTIVITY: Your physical activity doesn't need to be restricted. However, if you are very active, you may see some blood in your urine. We suggest that you reduce your activity under these circumstances until the bleeding has stopped.  BOWELS: It is important to  keep your bowels regular during the postoperative period. Straining with bowel movements can cause bleeding. A bowel movement every other day is reasonable. Use a mild laxative if needed, such as Milk of Magnesia 2-3 tablespoons, or 2 Dulcolax tablets. Call if you continue to have problems. If you have been taking narcotics for pain, before, during or after your surgery, you may be constipated. Take a laxative if necessary.   MEDICATION: You should resume your pre-surgery medications unless told not to. You may take oxybutynin or flomax if prescribed for bladder spasms or discomfort from the stent Take pain medication as directed for pain refractory to conservative management  PROBLEMS YOU SHOULD REPORT TO US:  Fevers over 100.5 Fahrenheit.  Heavy bleeding, or clots ( See above notes about blood in urine ).  Inability to urinate.  Drug reactions ( hives, rash, nausea, vomiting, diarrhea ).  Severe burning or pain with urination that is not improving.  General Anesthesia, Adult, Care After These instructions provide you with information about caring for yourself after your procedure. Your health care provider may also give you more specific instructions. Your treatment has been planned according to current medical practices, but problems sometimes occur. Call your health care provider if you have any problems or questions after your procedure. What can I expect after the procedure? After the procedure, it is common to have:  Vomiting.  A sore throat.  Mental slowness.  It is common to feel:  Nauseous.  Cold or shivery.  Sleepy.  Tired.  Sore or achy, even in parts of your body where you did not have surgery.  Follow these instructions at home: For at least 24 hours after the procedure:  Do not: ? Participate in activities where you could fall or become injured. ? Drive. ? Use heavy machinery. ? Drink alcohol. ? Take sleeping pills or medicines that cause  drowsiness. ? Make important decisions or sign legal documents. ? Take care of children on your own.  Rest. Eating and drinking  If you vomit, drink water, juice, or soup when you can drink without vomiting.  Drink enough fluid to keep your urine clear or pale yellow.  Make sure you have little or no nausea before eating solid foods.  Follow the diet recommended by your health care provider. General instructions  Have a responsible adult stay with you until you are awake and alert.  Return to your normal activities as told by your health care provider. Ask your health care provider what activities are safe for you.  Take over-the-counter and prescription medicines only as told by your health care provider.  If you smoke, do not smoke without supervision.  Keep all follow-up visits as told by your health care provider. This is important. Contact a health care provider if:  You continue to have nausea or vomiting at home, and medicines are not helpful.  You cannot drink fluids or start eating again.  You cannot urinate after 8-12 hours.  You develop a skin rash.  You have fever.  You have increasing redness at the site of your procedure. Get help right away if:  You have difficulty breathing.  You have chest pain.  You have unexpected bleeding.  You feel that you are having a life-threatening or urgent problem. This information is not intended to replace advice given to you by your health care provider. Make sure you discuss any questions you have with your health care provider. Document Released: 02/14/2001 Document Revised: 04/12/2016 Document Reviewed: 10/23/2015 Elsevier Interactive Patient Education  2018 [employer]   Ureteral Stent Implantation, Care After Refer to this sheet in the next few weeks. These instructions provide you with information about caring for yourself after your procedure. Your health care provider may also give you more specific  instructions. Your treatment has been planned according to current medical practices, but problems sometimes occur. Call your health care provider if you have any problems or questions after your procedure. What can I expect after the procedure? After the procedure, it is common to have:  Nausea.  Mild pain when you urinate. You may feel this pain in your lower back or lower abdomen. Pain should stop within a few minutes after you urinate. This may last for up to 1 week.  A small amount of blood in your urine for several days.  Follow these instructions at home:  Medicines  Take over-the-counter and prescription medicines only as told by your health care provider.  If you were prescribed an antibiotic medicine, take it as told by your health care provider. Do not stop taking the antibiotic even if you start to feel better.  Do not drive for 24 hours if you received a sedative.  Do not drive or operate heavy machinery while taking prescription pain medicines. Activity  Return to your normal activities as told by your health care provider. Ask your health care provider what activities are safe for you.  Do not lift anything that is heavier than 10 lb (4.5  kg). Follow this limit for 1 week after your procedure, or for as long as told by your health care provider. General instructions  Watch for any blood in your urine. Call your health care provider if the amount of blood in your urine increases.  If you have a catheter: ? Follow instructions from your health care provider about taking care of your catheter and collection bag. ? Do not take baths, swim, or use a hot tub until your health care provider approves.  Drink enough fluid to keep your urine clear or pale yellow.  Keep all follow-up visits as told by your health care provider. This is important. Contact a health care provider if:  You have pain that gets worse or does not get better with medicine, especially pain when you  urinate.  You have difficulty urinating.  You feel nauseous or you vomit repeatedly during a period of more than 2 days after the procedure. Get help right away if:  Your urine is dark red or has blood clots in it.  You are leaking urine (have incontinence).  The end of the stent comes out of your urethra.  You cannot urinate.  You have sudden, sharp, or severe pain in your abdomen or lower back.  You have a fever. This information is not intended to replace advice given to you by your health care provider. Make sure you discuss any questions you have with your health care provider. Document Released: 07/11/2013 Document Revised: 04/15/2016 Document Reviewed: 05/23/2015 Elsevier Interactive Patient Education  [employer].

## 2018-02-24 NOTE — Anesthesia Procedure Notes (Signed)
Procedure Name: LMA Insertion Date/Time: 02/24/2018 1:25 PM Performed by: Doran ClayAlday, Vieno Tarrant R, CRNA Pre-anesthesia Checklist: Patient identified, Emergency Drugs available, Suction available, Patient being monitored and Timeout performed Patient Re-evaluated:Patient Re-evaluated prior to induction Oxygen Delivery Method: Circle system utilized Preoxygenation: Pre-oxygenation with 100% oxygen Induction Type: IV induction Ventilation: Mask ventilation without difficulty LMA: LMA inserted LMA Size: 5.0 Number of attempts: 1 Placement Confirmation: positive ETCO2 and breath sounds checked- equal and bilateral Tube secured with: Tape Dental Injury: Teeth and Oropharynx as per pre-operative assessment

## 2018-02-24 NOTE — Op Note (Signed)
Operative Note  Preoperative diagnosis:  1.  Right ureteral calculus  Postoperative diagnosis: 1.  Right ureteral calculus  Procedure(s): 1.  Cystoscopy with right retrograde pyelogram with interpretation, right ureteroscopy with laser lithotripsy and stone basketing, ureteral stent placement, fluoroscopy less than 1 hour  Surgeon: Modena SlaterEugene Jailon Schaible, MD  Assistants: None  Anesthesia: General  Complications: None immediate  EBL: Minimal  Specimens: 1.  Renal calculus for analysis  Drains/Catheters: 1.  6 x 26 double-J ureteral stent without a string  Intraoperative findings: 1.  Approximately 8 mm proximal right ureteral calculus fragmented to smaller fragments 2.  Normal urethra and bladder 3.  Right retrograde pyelogram revealed hydronephrosis above the level of the stone  Indication: 52 year old male with a right ureteral calculus who failed trial passage presents for the previously mentioned operation.  Description of procedure:  The patient was identified and consent was obtained.  The patient was taken to the operating room and placed in the supine position.  The patient was placed under general anesthesia.  Perioperative antibiotics were administered.  The patient was placed in dorsal lithotomy.  Patient was prepped and draped in a standard sterile fashion and a timeout was performed.  A 21 French rigid cystoscope was advanced into the urethra and into the bladder.  Cystoscopy was performed.  The right ureter was cannulated with a sensor wire which was advanced up to the kidney under fluoroscopic guidance.  A semirigid ureteroscope was advanced alongside the wire up to the stone of interest.  A laser fiber was used to fragment the stone to smaller fragments.  I then shot a retrograde pyelogram through the scope.  The findings are noted above.  There was one larger stone fragment that was basket extracted and collected for specimen.  There were no other significant ureteral  fragments remaining.  There is no significant trauma to the ureter.  The sensor wire was backloaded onto the cystoscope which was advanced back into the bladder.  A 6 x 26 double-J ureteral stent was placed in a standard fashion and the wire was withdrawn.  Fluoroscopy confirmed proximal placement and direct visualization confirmed a good coil within the bladder.  The bladder was drained and the scope withdrawn.  Patient tolerated procedure well and was stable postoperatively.  Plan: Follow-up in 1 week for ureteral stent removal.  Follow-up 6 weeks after that with a renal ultrasound and KUB

## 2018-02-27 ENCOUNTER — Encounter (HOSPITAL_COMMUNITY): Payer: Self-pay | Admitting: Urology

## 2018-03-03 DIAGNOSIS — N201 Calculus of ureter: Secondary | ICD-10-CM | POA: Diagnosis not present

## 2018-03-03 DIAGNOSIS — R310 Gross hematuria: Secondary | ICD-10-CM | POA: Diagnosis not present

## 2018-04-20 DIAGNOSIS — N201 Calculus of ureter: Secondary | ICD-10-CM | POA: Diagnosis not present

## 2018-05-17 DIAGNOSIS — I10 Essential (primary) hypertension: Secondary | ICD-10-CM | POA: Diagnosis not present

## 2018-05-17 DIAGNOSIS — Z1389 Encounter for screening for other disorder: Secondary | ICD-10-CM | POA: Diagnosis not present

## 2018-05-17 DIAGNOSIS — E1142 Type 2 diabetes mellitus with diabetic polyneuropathy: Secondary | ICD-10-CM | POA: Diagnosis not present

## 2018-05-17 DIAGNOSIS — E7849 Other hyperlipidemia: Secondary | ICD-10-CM | POA: Diagnosis not present

## 2018-05-17 DIAGNOSIS — Z0000 Encounter for general adult medical examination without abnormal findings: Secondary | ICD-10-CM | POA: Diagnosis not present

## 2018-05-17 DIAGNOSIS — K210 Gastro-esophageal reflux disease with esophagitis: Secondary | ICD-10-CM | POA: Diagnosis not present

## 2018-05-22 ENCOUNTER — Other Ambulatory Visit: Payer: Self-pay

## 2018-05-22 ENCOUNTER — Encounter (HOSPITAL_COMMUNITY): Payer: Self-pay | Admitting: Emergency Medicine

## 2018-05-22 ENCOUNTER — Emergency Department (HOSPITAL_COMMUNITY): Payer: Medicare Other

## 2018-05-22 ENCOUNTER — Emergency Department (HOSPITAL_COMMUNITY)
Admission: EM | Admit: 2018-05-22 | Discharge: 2018-05-22 | Disposition: A | Discharge status: Home / Self Care | Payer: Medicare Other | Attending: Emergency Medicine | Admitting: Emergency Medicine

## 2018-05-22 DIAGNOSIS — J45909 Unspecified asthma, uncomplicated: Secondary | ICD-10-CM | POA: Insufficient documentation

## 2018-05-22 DIAGNOSIS — R0789 Other chest pain: Secondary | ICD-10-CM | POA: Diagnosis not present

## 2018-05-22 DIAGNOSIS — Z87891 Personal history of nicotine dependence: Secondary | ICD-10-CM | POA: Diagnosis not present

## 2018-05-22 DIAGNOSIS — Y93H2 Activity, gardening and landscaping: Secondary | ICD-10-CM | POA: Diagnosis not present

## 2018-05-22 DIAGNOSIS — T679XXA Effect of heat and light, unspecified, initial encounter: Secondary | ICD-10-CM | POA: Diagnosis not present

## 2018-05-22 DIAGNOSIS — R0602 Shortness of breath: Secondary | ICD-10-CM | POA: Diagnosis not present

## 2018-05-22 DIAGNOSIS — Z7984 Long term (current) use of oral hypoglycemic drugs: Secondary | ICD-10-CM | POA: Insufficient documentation

## 2018-05-22 DIAGNOSIS — E119 Type 2 diabetes mellitus without complications: Secondary | ICD-10-CM | POA: Insufficient documentation

## 2018-05-22 DIAGNOSIS — I5042 Chronic combined systolic (congestive) and diastolic (congestive) heart failure: Secondary | ICD-10-CM | POA: Diagnosis not present

## 2018-05-22 DIAGNOSIS — Z79899 Other long term (current) drug therapy: Secondary | ICD-10-CM | POA: Insufficient documentation

## 2018-05-22 DIAGNOSIS — R072 Precordial pain: Secondary | ICD-10-CM | POA: Insufficient documentation

## 2018-05-22 DIAGNOSIS — Y92017 Garden or yard in single-family (private) house as the place of occurrence of the external cause: Secondary | ICD-10-CM | POA: Diagnosis not present

## 2018-05-22 DIAGNOSIS — R202 Paresthesia of skin: Secondary | ICD-10-CM | POA: Insufficient documentation

## 2018-05-22 DIAGNOSIS — I110 Hypertensive heart disease with heart failure: Secondary | ICD-10-CM | POA: Insufficient documentation

## 2018-05-22 DIAGNOSIS — R079 Chest pain, unspecified: Secondary | ICD-10-CM | POA: Diagnosis not present

## 2018-05-22 DIAGNOSIS — R0600 Dyspnea, unspecified: Secondary | ICD-10-CM | POA: Diagnosis not present

## 2018-05-22 LAB — BRAIN NATRIURETIC PEPTIDE: B NATRIURETIC PEPTIDE 5: 23 pg/mL (ref 0.0–100.0)

## 2018-05-22 LAB — CBC WITH DIFFERENTIAL/PLATELET
Basophils Absolute: 0 10*3/uL (ref 0.0–0.1)
Basophils Relative: 0 %
EOS ABS: 0.1 10*3/uL (ref 0.0–0.7)
EOS PCT: 2 %
HCT: 47.9 % (ref 39.0–52.0)
Hemoglobin: 15.8 g/dL (ref 13.0–17.0)
LYMPHS ABS: 1.7 10*3/uL (ref 0.7–4.0)
Lymphocytes Relative: 22 %
MCH: 30.9 pg (ref 26.0–34.0)
MCHC: 33 g/dL (ref 30.0–36.0)
MCV: 93.7 fL (ref 78.0–100.0)
Monocytes Absolute: 0.6 10*3/uL (ref 0.1–1.0)
Monocytes Relative: 8 %
Neutro Abs: 5.3 10*3/uL (ref 1.7–7.7)
Neutrophils Relative %: 68 %
PLATELETS: 189 10*3/uL (ref 150–400)
RBC: 5.11 MIL/uL (ref 4.22–5.81)
RDW: 13.4 % (ref 11.5–15.5)
WBC: 7.8 10*3/uL (ref 4.0–10.5)

## 2018-05-22 LAB — CBG MONITORING, ED: GLUCOSE-CAPILLARY: 168 mg/dL — AB (ref 70–99)

## 2018-05-22 LAB — I-STAT TROPONIN, ED
Troponin i, poc: 0 ng/mL (ref 0.00–0.08)
Troponin i, poc: 0 ng/mL (ref 0.00–0.08)
Troponin i, poc: 0 ng/mL (ref 0.00–0.08)

## 2018-05-22 LAB — BASIC METABOLIC PANEL
ANION GAP: 8 (ref 5–15)
BUN: 14 mg/dL (ref 6–20)
CO2: 25 mmol/L (ref 22–32)
Calcium: 9.1 mg/dL (ref 8.9–10.3)
Chloride: 108 mmol/L (ref 98–111)
Creatinine, Ser: 1.15 mg/dL (ref 0.61–1.24)
GLUCOSE: 167 mg/dL — AB (ref 70–99)
Potassium: 3.5 mmol/L (ref 3.5–5.1)
Sodium: 141 mmol/L (ref 135–145)

## 2018-05-22 MED ORDER — NITROGLYCERIN 0.4 MG SL SUBL: 0.4000 mg | SUBLINGUAL_TABLET | SUBLINGUAL | 0 refills | Status: AC | PRN

## 2018-05-22 MED ORDER — ASPIRIN 81 MG PO CHEW
324.0000 mg | CHEWABLE_TABLET | Freq: Once | ORAL | Status: AC
  Administered 2018-05-22: 324 mg via ORAL
  Filled 2018-05-22: qty 4

## 2018-05-22 NOTE — ED Provider Notes (Signed)
[facility]Y [facility]PARTMENT Provider Note   CSN: [id number] Arrival date & time: 05/22/18  [id number]     History   Chief Complaint Chief Complaint  Patient presents with  . Shortness of Breath  . Chest Pain    HPI Angel Donovan is a 52 y.o. male.  Patient is a 52 year old male who presents to the emergency department with a complaint of shortness of breath and some chest tightness.  Patient has a history of coronary artery disease, history of inferior MI in November 2011.  He has a bare-metal stent in the circumflex he is also had disease in the LAD, with restenosis of a stent in that area.  He has an ejection fraction of approximately 40% on last echo.  He is also been treated in the past for some congestive heart failure.  He has a history of hepatitis C, as well as some depression with some previous suicidal attempts.  The patient states this problem started on last evening while he was doing some yard work.  He had some laboring of his breathing he began to have some numbness of his left arm and some tightness in his chest.  He stopped what he was doing went and laid down it got a little better, but would come and go to the night.  He continued to have some mild left chest tightness and some dizziness earlier this morning and came to the emergency department for additional evaluation.  There was no loss of consciousness reported.  There was nausea, but no actual vomiting.  The patient does feel somewhat short of breath at times.     Past Medical History:  Diagnosis Date  . Anal fissure   . Arthritis   . Asthma   . Carotid artery disease (HCC)    Doppler, June, 2014, 1-39% bilateral  . CHF (congestive heart failure) (HCC)    Initially post MI, then improved.  . Coronary artery disease    Inferior MI November, 2011, bare-metal stent large circumflex, moderate LAD disease  /   catheterization June, 2012 LAD unchanged, moderate in-stent restenosis circumflex, improved LV  function, EF 45%, medical therapy  . Depression   . Depression   . Diabetes mellitus without complication (HCC)    type 2- on po meds   . Dizziness    November, 2013  . Ejection fraction < 50%    EF 30% echo November, 2011 (MI)  / EF 40%, echo, months after MI  /  EF 45%, cardiac catheterization, June, 201 to  . GERD (gastroesophageal reflux disease)   . Hepatitis C   . High cholesterol   . History of alcohol abuse   . History of cocaine abuse    in 80's, ex-IVDU  . History of kidney stones   . Hypertension   . Myocardial infarction (HCC)    2011   . Pulmonary nodule     Patient Active Problem List   Diagnosis Date Noted  . Tachycardia 08/20/2015  . Cocaine abuse with cocaine-induced mood disorder (HCC) 11/09/2014  . 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 Major depressive disorder, recurrent, severe without psychotic features (HCC)   . 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 Non-STEMI (non-ST elevated myocardial infarction) (HCC) 11/04/2014  . Suicide attempt (HCC) 11/04/2014  . Polysubstance abuse (HCC) 11/04/2014  . HLD (hyperlipidemia) 11/04/2014  . Left arm swelling 11/04/2014  . Cellulitis of left upper extremity   . Cellulitis of right  upper extremity   . Cocaine use   . Alcohol-induced mood disorder (HCC) 07/09/2014  . Pre-syncope 11/13/2013  . Carotid artery disease (HCC)   . Chronic combined systolic and diastolic CHF (congestive heart failure) (HCC) 04/27/2013  . Dizziness   . Hypotension 05/04/2012  . Coronary artery disease   . Ejection fraction < 50%   . DYSLIPIDEMIA 01/11/2008  . 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 ANAL FISSURE 01/10/2008  . Nonspecific (abnormal) findings on radiological and other examination of body structure 11/28/2007  . ABNORMAL RADIOLOGIAL EXAMINATION 11/28/2007  . PULMONARY NODULE 09/08/2007  . HIP PAIN, RIGHT 07/28/2007  . 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 KERATODERMA,  ACQUIRED 04/12/2007  . DYSURIA 04/12/2007  . TOBACCO USE 02/21/2007  . DYSPEPSIA 02/21/2007  . Chronic hepatitis C (HCC) 12/19/2006  . ABUSE, ALCOHOL, IN REMISSION 12/19/2006  . Cocaine abuse in remission (HCC) 12/19/2006  . Depression 12/19/2006  . Essential hypertension 12/19/2006    Past Surgical History:  Procedure Laterality Date  . CARDIAC CATHETERIZATION     with PCI RCA 10/19/2010  . coronary stents    . CYSTOSCOPY/URETEROSCOPY/HOLMIUM LASER/STENT PLACEMENT Right 02/24/2018   Procedure: RIGHT URETEROSCOPY HOLMIUM LASER STENT PLACEMENT RETROGRADE PYELOGRAM;  Surgeon: Crista Elliot, MD;  Location: [facility];  Service: Urology;  Laterality: Right;        Home Medications    Prior to Admission medications   Medication Sig Start Date End Date Taking? Authorizing Provider  albuterol (PROAIR HFA) 108 (90 BASE) MCG/ACT inhaler Inhale 1 puff into the lungs every 6 (six) hours as needed for wheezing or shortness of breath. 11/11/14   Armandina Stammer I, NP  amLODipine (NORVASC) 5 MG tablet Take 1 tablet by mouth daily. 05/16/18   [provider]  atorvastatin (LIPITOR) 40 MG tablet Take 40 mg by mouth daily.    [provider]  benazepril (LOTENSIN) 40 MG tablet Take 40 mg by mouth daily. 12/24/17   [provider]  Brexpiprazole (REXULTI) 2 MG TABS Take 1 tablet by mouth every morning.     [provider]  carvedilol (COREG) 6.25 MG tablet Take 1 tablet by mouth 2 (two) times daily. 12/15/15   [provider]  chlorthalidone (HYGROTON) 25 MG tablet Take 25 mg by mouth daily. 12/24/17   [provider]  doxepin (SINEQUAN) 100 MG capsule Take 100 mg by mouth at bedtime.    [provider]  DULoxetine (CYMBALTA) 20 MG capsule Take 20 mg by mouth daily.  01/17/18   [provider]  famotidine (PEPCID) 20 MG tablet Take 20 mg by mouth 2 (two) times daily.    [provider]  FLUoxetine (PROZAC) 20 MG capsule Take 40  capsules by mouth daily.  02/16/16   [provider]  metFORMIN (GLUCOPHAGE) 500 MG tablet Take 500 mg by mouth daily. 12/24/17   [provider]  nitroGLYCERIN (NITROSTAT) 0.4 MG SL tablet Place 0.4 mg under the tongue every 5 (five) minutes as needed for chest pain.    [provider]  oxybutynin (DITROPAN) 5 MG tablet Take 1 tablet (5 mg total) by mouth every 8 (eight) hours as needed for bladder spasms. 02/24/18   Crista Elliot, MD  oxyCODONE-acetaminophen (PERCOCET) 5-325 MG tablet Take 1 tablet by mouth every 4 (four) hours as needed for severe pain. 02/24/18 02/24/19  Crista Elliot, MD  oxyCODONE-acetaminophen (PERCOCET/ROXICET) 5-325 MG tablet Take 1 tablet by mouth every 6 (six) hours as needed for moderate  pain or severe pain.    [provider]  tamsulosin (FLOMAX) 0.4 MG CAPS capsule Take 1 capsule (0.4 mg total) by mouth daily. 02/24/18   Crista Elliot, MD  tiZANidine (ZANAFLEX) 4 MG tablet Take 4 mg by mouth at bedtime.     [provider]    Family History Family History  Problem Relation Age of Onset  . Coronary artery disease Father   . Cancer Father        lymphoma  . Cancer - Colon Father   . Ulcerative colitis Father     Social History Social History   Tobacco Use  . Smoking status: Former Smoker    Packs/day: 1.00    Years: 35.00    Pack years: 35.00    Types: Cigarettes    Start date: 06/03/1976    Last attempt to quit: 10/18/2010    Years since quitting: 7.5  . Smokeless tobacco: Never Used  Substance Use Topics  . Alcohol use: No    Alcohol/week: 0.0 oz    Comment: quit in 2000  . Drug use: Yes    Types: Cocaine    Comment: cocaine. quit in 2000     Allergies   Patient has no known allergies.   Review of Systems Review of Systems  Constitutional: Negative for activity change.       All ROS Neg except as noted in HPI  HENT: Negative for nosebleeds.   Eyes: Negative for photophobia and discharge.   Respiratory: Positive for chest tightness and shortness of breath. Negative for cough and wheezing.   Cardiovascular: Positive for chest pain. Negative for palpitations.  Gastrointestinal: Negative for abdominal pain and blood in stool.  Genitourinary: Negative for dysuria, frequency and hematuria.  Musculoskeletal: Negative for arthralgias, back pain and neck pain.  Skin: Negative.   Neurological: Negative for dizziness, seizures and speech difficulty.  Psychiatric/Behavioral: Negative for confusion and hallucinations.     Physical Exam Updated Vital Signs BP (!) 136/91 (BP Location: Right Arm)   Pulse 78   Temp (!) 97.5 F (36.4 C) (Oral)   Resp 20   Ht 5\' 10"  (1.778 m)   Wt 97.5 kg (215 lb)   SpO2 99%   BMI 30.85 kg/m   Physical Exam  Constitutional: He appears well-developed and well-nourished. No distress.  HENT:  Head: Normocephalic and atraumatic.  Right Ear: External ear normal.  Left Ear: External ear normal.  Eyes: Conjunctivae are normal. Right eye exhibits no discharge. Left eye exhibits no discharge. No scleral icterus.  Neck: Neck supple. No tracheal deviation present.  Cardiovascular: Normal rate, regular rhythm, normal heart sounds and intact distal pulses.  No murmur heard. Pulmonary/Chest: Effort normal and breath sounds normal. No stridor. No respiratory distress. He has no wheezes. He has no rales.  There is symmetrical rise and fall of the chest.  Patient speaks in complete sentences without problem.  Abdominal: Soft. Bowel sounds are normal. He exhibits no distension. There is no tenderness. There is no rebound and no guarding.  Musculoskeletal: He exhibits no edema or tenderness.  Neurological: He is alert. He has normal strength. No cranial nerve deficit (no facial droop, extraocular movements intact, no slurred speech) or sensory deficit. He exhibits normal muscle tone. He displays no seizure activity. Coordination normal.  Skin: Skin is warm and dry.  No rash noted.  Psychiatric: He has a normal mood and affect.  Nursing note and vitals reviewed.    ED Treatments / Results  Labs (all labs ordered are listed, but only abnormal results are displayed) Labs Reviewed  CBG MONITORING, ED - Abnormal; Notable for the following components:      Result Value   Glucose-Capillary 168 (*)    All other components within normal limits  CBC WITH DIFFERENTIAL/PLATELET  BASIC METABOLIC PANEL  I-STAT TROPONIN, ED    EKG EKG Interpretation  Date/Time:  Monday May 22 2018 10:06:35 EDT Ventricular Rate:  77 PR Interval:    QRS Duration: 105 QT Interval:  370 QTC Calculation: 419 R Axis:   -50 Text Interpretation:  Sinus rhythm LAD, consider left anterior fascicular block Borderline low voltage, extremity leads Abnormal R-wave progression, early transition similar to prior 4/19 Confirmed by Meridee Score [phone]) on 05/22/2018 10:30:26 AM   Radiology No results found.  Procedures Procedures (including critical care time)  Medications Ordered in ED Medications  aspirin chewable tablet 324 mg (324 mg Oral Given 05/22/18 1021)     Initial Impression / Assessment and Plan / ED Course  I have reviewed the triage vital signs and the nursing notes.  Pertinent labs & imaging results that were available during my care of the patient were reviewed by me and considered in my medical decision making (see chart for details).  Clinical Course as of May 22 1524  Mon May 22, 2018  3232 52 year old male with prior history of coronary disease coming in with 2 episodes of substernal chest pain with exertion over the last 2 days.  States he usually takes nitro but did run on a nitro.  His lab work has been unremarkable here including 2 troponins and a unremarkable EKG.  Sounds like we need to get him plugged back in with cardiology as his last cath he states was about a year and a half ago and there was no interventions at that time.   [MB]    Clinical  Course User Index [MB] Terrilee Files, MD      Final Clinical Impressions(s) / ED Diagnoses MDM Vital signs are within normal limits.  Pulse oximetry is 98% on room air.  Within normal limits by my interpretation.  Patient states that he is beginning to feel better after having been here in the emergency department.  Recheck.  Patient resting comfortably.  Normal sinus rhythm on the monitor.  Vital signs stable.  Chest tightness improving, shortness of breath improving.   1:01pm - Pt sleeping. No distress. Vital signs stable. No life threatening arrhythmia on monitor.  Troponin is 0.0.  Electrocardiogram is negative for acute event, doubt emergent cardiac event.  Chest x-ray is negative for pneumonia or congestive heart failure or other emergent changes.  Pulse oximetry is 98%, patient speaks in complete sentences, I doubt the patient has an acute pulmonary issue.  Heart score is 3.  Glucose is 167, otherwise the basic metabolic panel is within normal limits.  The anion gap is 8. Patient seen with me by Dr. Charm Barges. Second troponin is 0.  Patient states he feels much better. The patient will be referred to cardiology here in this area.  The patient states he is out of his nitroglycerin, a prescription for the nitroglycerin will be given to the patient.  I have reviewed the findings with the patient in terms of which he understands.  The patient is advised to return to the emergency department immediately if any changes in his condition, problems, or concerns.  I have asked the patient to refrain from being out in the heat  or doing strenuous work until he is seen and evaluated by cardiology.  Patient is in agreement with this plan.    Final diagnoses:  Dyspnea, unspecified type  Heat effect, initial encounter    ED Discharge Orders    None       Ivery Quale, PA-C 05/22/18 1535    Terrilee Files, MD 05/23/18 1052

## 2018-05-22 NOTE — ED Triage Notes (Signed)
Pt c/o sob. States started having left arm numbness yesterday and sob this am. Mild labored breathing noted. States left chest is tight. C/o  "Little" dizziness with movement.

## 2018-05-22 NOTE — ED Notes (Signed)
Pt back from x-ray.

## 2018-05-22 NOTE — Discharge Instructions (Addendum)
Your heart has been monitored while being here in the emergency department, and no life-threatening arrhythmias have been found.  Your electrocardiogram is negative for an acute heart situation.  2 sets of cardiac enzymes have been negative today.  Please increase your fluids.  Please try to avoid direct heat as much as possible.  You may use your nitroglycerin as previously ordered.  Please call Dr. Diona BrownerMcDowell for cardiology evaluation given your cardiac history.  Return to the emergency department if any changes in your condition, problems, or concerns.

## 2018-06-23 ENCOUNTER — Encounter: Payer: Self-pay | Admitting: *Deleted

## 2018-06-23 ENCOUNTER — Ambulatory Visit (INDEPENDENT_AMBULATORY_CARE_PROVIDER_SITE_OTHER): Payer: Medicare Other | Admitting: Cardiovascular Disease

## 2018-06-23 ENCOUNTER — Encounter: Payer: Self-pay | Admitting: Cardiovascular Disease

## 2018-06-23 ENCOUNTER — Telehealth: Payer: Self-pay | Admitting: Cardiovascular Disease

## 2018-06-23 VITALS — BP 122/78 | HR 74 | Ht 70.0 in | Wt 211.0 lb

## 2018-06-23 DIAGNOSIS — R0609 Other forms of dyspnea: Secondary | ICD-10-CM

## 2018-06-23 DIAGNOSIS — I10 Essential (primary) hypertension: Secondary | ICD-10-CM | POA: Diagnosis not present

## 2018-06-23 DIAGNOSIS — I25118 Atherosclerotic heart disease of native coronary artery with other forms of angina pectoris: Secondary | ICD-10-CM | POA: Diagnosis not present

## 2018-06-23 DIAGNOSIS — I739 Peripheral vascular disease, unspecified: Secondary | ICD-10-CM

## 2018-06-23 DIAGNOSIS — R079 Chest pain, unspecified: Secondary | ICD-10-CM | POA: Diagnosis not present

## 2018-06-23 DIAGNOSIS — I779 Disorder of arteries and arterioles, unspecified: Secondary | ICD-10-CM | POA: Diagnosis not present

## 2018-06-23 DIAGNOSIS — E785 Hyperlipidemia, unspecified: Secondary | ICD-10-CM

## 2018-06-23 NOTE — Telephone Encounter (Signed)
Pre-cert Verification for the following procedure   Exercise stress & Echo [facility]04[facility]

## 2018-06-23 NOTE — Patient Instructions (Signed)
Your physician recommends that you schedule a follow-up appointment in: 2 MONTHS [facility]  Your physician has recommended you make the following change in your medication:   START ASPIRIN 81 MG DAILY   Your physician has requested that you have an echocardiogram. Echocardiography is a painless test that uses sound waves to create images of your heart. It provides your doctor with information about the size and shape of your heart and how well your heart's chambers and valves are working. This procedure takes approximately one hour. There are no restrictions for this procedure.  Your physician has requested that you have en exercise stress myoview. For further information please visit [email or web address]. Please follow instruction sheet, as given.  Your physician has requested that you have a carotid duplex. This test is an ultrasound of the carotid arteries in your neck. It looks at blood flow through these arteries that supply the brain with blood. Allow one hour for this exam. There are no restrictions or special instructions.

## 2018-06-23 NOTE — Progress Notes (Signed)
SUBJECTIVE: The patient presents for posthospitalization and overdue follow-up.  I evaluated him once before in April 2017. He has a history of non-STEMI with a bare-metal stent to a large circumflex in 2011, polysubstance abuse with cocaine abuse, hypertension, brachial vein thrombosis, chronic hepatitis C, and bilateral carotid artery disease.  Most recent echocardiogram dated 11/05/2014 showed mildly reduced left ventricular systolic function, EF 45-50%, with inferior wall hypokinesis.  He was evaluated for chest pain and shortness of breath in the ED on 05/22/2018.  I reviewed all relevant documentation, labs, and studies.  Troponins, CBC, and BNP were normal.  Other than some hyperglycemia, basic metabolic panel was normal.  He had run out of his nitroglycerin.  Chest x-ray showed mild chronic hyperinflation with no evidence of pneumonia or CHF.  I personally reviewed the ECG which demonstrated sinus rhythm with left axis deviation.  He has had a lot going on over the past few years.  His father-in-law passed away last year and his father passed away 3 years ago.  His wife was also ill.  He has been experiencing exertional dyspnea when walking 1/2 mile to his mailbox which has undulating hills.  He has had episodic chest pain as well.  He has to stop several times when walking to his mailbox.  He has not been taking aspirin for the past 3 months.  He quit smoking at the time of his MI.    Review of Systems: As per "subjective", otherwise negative.  No Known Allergies  Current Outpatient Medications  Medication Sig Dispense Refill  . albuterol (PROAIR HFA) 108 (90 BASE) MCG/ACT inhaler Inhale 1 puff into the lungs every 6 (six) hours as needed for wheezing or shortness of breath.    Marland Kitchen amLODipine (NORVASC) 5 MG tablet Take 1 tablet by mouth daily.    Marland Kitchen atorvastatin (LIPITOR) 40 MG tablet Take 40 mg by mouth daily.    . benazepril (LOTENSIN) 40 MG tablet Take 40 mg by mouth daily.   0  . Brexpiprazole (REXULTI) 2 MG TABS Take 1 tablet by mouth every morning.     . carvedilol (COREG) 6.25 MG tablet Take 1 tablet by mouth 2 (two) times daily.    . chlorthalidone (HYGROTON) 25 MG tablet Take 25 mg by mouth daily.  0  . doxepin (SINEQUAN) 100 MG capsule Take 100 mg by mouth at bedtime.    . DULoxetine (CYMBALTA) 20 MG capsule Take 20 mg by mouth daily.   2  . famotidine (PEPCID) 20 MG tablet Take 20 mg by mouth 2 (two) times daily.    Marland Kitchen FLUoxetine (PROZAC) 20 MG capsule Take 40 mg by mouth daily.     . metFORMIN (GLUCOPHAGE) 500 MG tablet Take 500 mg by mouth daily.  0  . nitroGLYCERIN (NITROSTAT) 0.4 MG SL tablet Place 1 tablet (0.4 mg total) under the tongue every 5 (five) minutes as needed for chest pain. 30 tablet 0  . tiZANidine (ZANAFLEX) 4 MG tablet Take 4 mg by mouth at bedtime.      No current facility-administered medications for this visit.     Past Medical History:  Diagnosis Date  . Anal fissure   . Arthritis   . Asthma   . Carotid artery disease (HCC)    Doppler, June, 2014, 1-39% bilateral  . CHF (congestive heart failure) (HCC)    Initially post MI, then improved.  . Coronary artery disease    Inferior MI November, 2011, bare-metal stent large  circumflex, moderate LAD disease  /   catheterization June, 2012 LAD unchanged, moderate in-stent restenosis circumflex, improved LV function, EF 45%, medical therapy  . Depression   . Depression   . Diabetes mellitus without complication (HCC)    type 2- on po meds   . Dizziness    November, 2013  . Ejection fraction < 50%    EF 30% echo November, 2011 (MI)  / EF 40%, echo, months after MI  /  EF 45%, cardiac catheterization, June, 201 to  . GERD (gastroesophageal reflux disease)   . Hepatitis C   . High cholesterol   . History of alcohol abuse   . History of cocaine abuse    in 80's, ex-IVDU  . History of kidney stones   . Hypertension   . Myocardial infarction (HCC)    2011   . Pulmonary nodule       Past Surgical History:  Procedure Laterality Date  . CARDIAC CATHETERIZATION     with PCI RCA 10/19/2010  . coronary stents    . CYSTOSCOPY/URETEROSCOPY/HOLMIUM LASER/STENT PLACEMENT Right 02/24/2018   Procedure: RIGHT URETEROSCOPY HOLMIUM LASER STENT PLACEMENT RETROGRADE PYELOGRAM;  Surgeon: Crista Elliot, MD;  Location: [facility];  Service: Urology;  Laterality: Right;    Social History   Socioeconomic History  . Marital status: Married    Spouse name: Not on file  . Number of children: Not on file  . Years of education: Not on file  . Highest education level: Not on file  Occupational History  . Not on file  Social Needs  . Financial resource strain: Not on file  . Food insecurity:    Worry: Not on file    Inability: Not on file  . Transportation needs:    Medical: Not on file    Non-medical: Not on file  Tobacco Use  . Smoking status: Former Smoker    Packs/day: 1.00    Years: 35.00    Pack years: 35.00    Types: Cigarettes    Start date: 06/03/1976    Last attempt to quit: 10/18/2010    Years since quitting: 7.6  . Smokeless tobacco: Never Used  Substance and Sexual Activity  . Alcohol use: No    Alcohol/week: 0.0 oz    Comment: quit in 2000  . Drug use: Yes    Types: Cocaine    Comment: cocaine. quit in 2000  . Sexual activity: Yes    Birth control/protection: None  Lifestyle  . Physical activity:    Days per week: Not on file    Minutes per session: Not on file  . Stress: Not on file  Relationships  . Social connections:    Talks on phone: Not on file    Gets together: Not on file    Attends religious service: Not on file    Active member of club or organization: Not on file    Attends meetings of clubs or organizations: Not on file    Relationship status: Not on file  . Intimate partner violence:    Fear of current or ex partner: Not on file    Emotionally abused: Not on file    Physically abused: Not on file    Forced sexual activity: Not  on file  Other Topics Concern  . Not on file  Social History Narrative  . Not on file     Vitals:   06/23/18 0923  BP: 122/78  Pulse: 74  SpO2: 98%  Weight: 211 lb (95.7 kg)  Height: 5\' 10"  (1.778 m)    Wt Readings from Last 3 Encounters:  06/23/18 211 lb (95.7 kg)  05/22/18 215 lb (97.5 kg)  02/24/18 204 lb 6.4 oz (92.7 kg)     PHYSICAL EXAM General: NAD HEENT: Normal. Neck: No JVD, no thyromegaly. Lungs: Diminished sounds, no crackles or wheezes. CV: Regular rate and rhythm, normal S1/S2, no S3/S4, no murmur. No pretibial or periankle edema.  No carotid bruit.   Abdomen: Soft, nontender, no distention.  Neurologic: Alert and oriented.  Psych: Normal affect. Skin: Normal. Musculoskeletal: No gross deformities.    ECG: Reviewed above under Subjective   Labs: Lab Results  Component Value Date/Time   K 3.5 05/22/2018 10:30 AM   BUN 14 05/22/2018 10:30 AM   CREATININE 1.15 05/22/2018 10:30 AM   CREATININE 1.48 (H) 11/21/2013 10:13 AM   ALT 23 02/24/2018 12:13 PM   TSH 3.381 11/21/2013 10:13 AM   HGB 15.8 05/22/2018 10:30 AM     Lipids: Lab Results  Component Value Date/Time   LDLCALC 34 07/11/2014 06:20 AM   CHOL 101 07/11/2014 06:20 AM   TRIG 218 (H) 07/11/2014 06:20 AM   HDL 23 (L) 07/11/2014 06:20 AM       ASSESSMENT AND PLAN:  1.  Coronary artery disease with prior PCI: He has had progressive exertional dyspnea and more frequent chest pain.  He is on on Coreg and atorvastatin but has not been taking aspirin.  I encouraged him to do so.  I will arrange for an exercise Myoview stress test to evaluate for ischemia.  I will obtain an echocardiogram to evaluate cardiac structure and function.  I will obtain a copy of lipids from PCP.   2. Essential HTN: Controlled on Coreg, benazepril, amlodipine, and chlorthalidone.  No changes to therapy.  3. Carotid artery disease: Mild in June 2014.  I will repeat Dopplers.  4.  Hyperlipidemia: Currently on  atorvastatin 40 mg.  I will obtain a copy of lipids from PCP.   Disposition: Follow up 3 months  Time spent: 40 minutes, of which greater than 50% was spent reviewing symptoms, relevant blood tests and studies, and discussing management plan with the patient.    Prentice DockerSuresh Koneswaran, M.D., F.A.C.C.

## 2018-07-04 ENCOUNTER — Encounter (HOSPITAL_BASED_OUTPATIENT_CLINIC_OR_DEPARTMENT_OTHER)
Admission: RE | Admit: 2018-07-04 | Discharge: 2018-07-04 | Disposition: A | Discharge status: Home / Self Care | Payer: Medicare Other | Source: Ambulatory Visit | Attending: Cardiovascular Disease | Admitting: Cardiovascular Disease

## 2018-07-04 ENCOUNTER — Encounter (HOSPITAL_COMMUNITY): Payer: Self-pay

## 2018-07-04 ENCOUNTER — Telehealth: Payer: Self-pay | Admitting: Cardiovascular Disease

## 2018-07-04 ENCOUNTER — Ambulatory Visit (HOSPITAL_BASED_OUTPATIENT_CLINIC_OR_DEPARTMENT_OTHER)
Admission: RE | Admit: 2018-07-04 | Discharge: 2018-07-04 | Disposition: A | Discharge status: Home / Self Care | Payer: Medicare Other | Source: Ambulatory Visit | Attending: Cardiovascular Disease | Admitting: Cardiovascular Disease

## 2018-07-04 ENCOUNTER — Ambulatory Visit (HOSPITAL_COMMUNITY)
Admission: RE | Admit: 2018-07-04 | Discharge: 2018-07-04 | Disposition: A | Discharge status: Home / Self Care | Payer: Medicare Other | Source: Ambulatory Visit | Attending: Cardiovascular Disease | Admitting: Cardiovascular Disease

## 2018-07-04 DIAGNOSIS — E785 Hyperlipidemia, unspecified: Secondary | ICD-10-CM | POA: Diagnosis not present

## 2018-07-04 DIAGNOSIS — F1011 Alcohol abuse, in remission: Secondary | ICD-10-CM | POA: Insufficient documentation

## 2018-07-04 DIAGNOSIS — I252 Old myocardial infarction: Secondary | ICD-10-CM | POA: Diagnosis not present

## 2018-07-04 DIAGNOSIS — Z720 Tobacco use: Secondary | ICD-10-CM | POA: Insufficient documentation

## 2018-07-04 DIAGNOSIS — R079 Chest pain, unspecified: Secondary | ICD-10-CM | POA: Diagnosis not present

## 2018-07-04 DIAGNOSIS — B182 Chronic viral hepatitis C: Secondary | ICD-10-CM | POA: Diagnosis not present

## 2018-07-04 DIAGNOSIS — R0609 Other forms of dyspnea: Secondary | ICD-10-CM | POA: Diagnosis not present

## 2018-07-04 DIAGNOSIS — I371 Nonrheumatic pulmonary valve insufficiency: Secondary | ICD-10-CM | POA: Diagnosis not present

## 2018-07-04 DIAGNOSIS — I110 Hypertensive heart disease with heart failure: Secondary | ICD-10-CM | POA: Insufficient documentation

## 2018-07-04 DIAGNOSIS — I509 Heart failure, unspecified: Secondary | ICD-10-CM | POA: Insufficient documentation

## 2018-07-04 LAB — NM MYOCAR MULTI W/SPECT W/WALL MOTION / EF
CHL CUP MPHR: 166 {beats}/min
CHL CUP NUCLEAR SSS: 8
CHL RATE OF PERCEIVED EXERTION: 17
CSEPED: 5 min
CSEPHR: 86 %
Estimated workload: 7 METS
Exercise duration (sec): 31 s
LHR: 0.43
LV dias vol: 132 mL (ref 62–150)
LV sys vol: 83 mL
Peak HR: 144 {beats}/min
Rest HR: 71 {beats}/min
SDS: 3
SRS: 5
TID: 1.09

## 2018-07-04 MED ORDER — TECHNETIUM TC 99M TETROFOSMIN IV KIT
30.0000 | PACK | Freq: Once | INTRAVENOUS | Status: AC | PRN
  Administered 2018-07-04: 30 via INTRAVENOUS

## 2018-07-04 MED ORDER — TECHNETIUM TC 99M TETROFOSMIN IV KIT
10.0000 | PACK | Freq: Once | INTRAVENOUS | Status: AC | PRN
  Administered 2018-07-04: 9.8 via INTRAVENOUS

## 2018-07-04 MED ORDER — SODIUM CHLORIDE 0.9% FLUSH
INTRAVENOUS | Status: AC
  Administered 2018-07-04: 10 mL via INTRAVENOUS
  Filled 2018-07-04: qty 10

## 2018-07-04 MED ORDER — REGADENOSON 0.4 MG/5ML IV SOLN
INTRAVENOUS | Status: AC
  Filled 2018-07-04: qty 5

## 2018-07-04 NOTE — Progress Notes (Signed)
*  PRELIMINARY RESULTS* Echocardiogram 2D Echocardiogram has been performed.  Stacey DrainWhite, Rollo Farquhar J 07/04/2018, 12:06 PM

## 2018-07-04 NOTE — Telephone Encounter (Signed)
Results given to pt,copied pcp 

## 2018-07-04 NOTE — Telephone Encounter (Signed)
Returning call can be reached @ [phone]

## 2018-07-06 ENCOUNTER — Ambulatory Visit (INDEPENDENT_AMBULATORY_CARE_PROVIDER_SITE_OTHER): Payer: Medicare Other

## 2018-07-06 ENCOUNTER — Telehealth: Payer: Self-pay | Admitting: *Deleted

## 2018-07-06 DIAGNOSIS — I779 Disorder of arteries and arterioles, unspecified: Secondary | ICD-10-CM

## 2018-07-06 DIAGNOSIS — I739 Peripheral vascular disease, unspecified: Principal | ICD-10-CM

## 2018-07-06 NOTE — Telephone Encounter (Signed)
-----   Message from Laqueta LindenSuresh A Koneswaran, MD sent at 07/06/2018  3:05 PM EDT ----- No blockages.

## 2018-07-06 NOTE — Telephone Encounter (Signed)
Called patient with test results. No answer. Left message to call back.  

## 2018-07-14 ENCOUNTER — Telehealth: Payer: Self-pay | Admitting: *Deleted

## 2018-07-14 NOTE — Telephone Encounter (Signed)
Notes recorded by Lesle ChrisHill, Angela G, LPN on 1/61/09608/23/2019 at 4:49 PM EDT Lynden AngCathy (wife) notified. Copy to pmd. Follow up scheduled for October. ------  Notes recorded by Lesle ChrisHill, Angela G, LPN on 4/54/09818/14/2019 at 4:14 PM EDT Left message to return call.  ------  Notes recorded by Laqueta LindenKoneswaran, Suresh A, MD on 07/04/2018 at 2:13 PM EDT Cardiac function might be slightly less than it was in December 2015 but no major changes. Continue present medical therapy.

## 2018-08-31 ENCOUNTER — Ambulatory Visit: Payer: Self-pay | Admitting: Cardiovascular Disease

## 2018-09-13 DIAGNOSIS — I10 Essential (primary) hypertension: Secondary | ICD-10-CM | POA: Diagnosis not present

## 2018-09-13 DIAGNOSIS — Z0000 Encounter for general adult medical examination without abnormal findings: Secondary | ICD-10-CM | POA: Diagnosis not present

## 2018-09-13 DIAGNOSIS — E1142 Type 2 diabetes mellitus with diabetic polyneuropathy: Secondary | ICD-10-CM | POA: Diagnosis not present

## 2018-09-13 DIAGNOSIS — E7849 Other hyperlipidemia: Secondary | ICD-10-CM | POA: Diagnosis not present

## 2018-09-13 DIAGNOSIS — K210 Gastro-esophageal reflux disease with esophagitis: Secondary | ICD-10-CM | POA: Diagnosis not present

## 2018-09-24 IMAGING — NM NM MYOCAR MULTI W/SPECT W/WALL MOTION & EF
2 series · 12 of 12 positions shown · non-contrast
Comparison: none

[Series 1: rest · 6.51mm/px · 6 of 64 frames shown]
[frame 6/64]
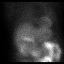
[frame 16/64]
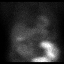
[frame 27/64]
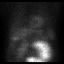
[frame 38/64]
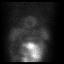
[frame 48/64]
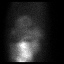
[frame 59/64]
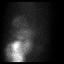

[Series 3: stress gated - perfusion · 6.51mm/px · 6 of 64 frames shown]
[frame 6/64]
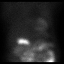
[frame 16/64]
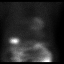
[frame 27/64]
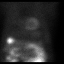
[frame 38/64]
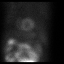
[frame 48/64]
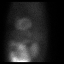
[frame 59/64]
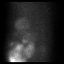

[12 of 12 positions shown; findings below may reference images not displayed]

Canned report from images found in remote index.

Refer to host system for actual result text.

## 2018-10-23 ENCOUNTER — Ambulatory Visit: Payer: Medicare Other | Admitting: Cardiovascular Disease

## 2018-10-24 ENCOUNTER — Encounter: Payer: Self-pay | Admitting: Cardiovascular Disease

## 2019-02-08 DIAGNOSIS — E7849 Other hyperlipidemia: Secondary | ICD-10-CM | POA: Diagnosis not present

## 2019-02-08 DIAGNOSIS — Z0000 Encounter for general adult medical examination without abnormal findings: Secondary | ICD-10-CM | POA: Diagnosis not present

## 2019-02-08 DIAGNOSIS — E1142 Type 2 diabetes mellitus with diabetic polyneuropathy: Secondary | ICD-10-CM | POA: Diagnosis not present

## 2019-02-08 DIAGNOSIS — I10 Essential (primary) hypertension: Secondary | ICD-10-CM | POA: Diagnosis not present

## 2019-02-08 DIAGNOSIS — K210 Gastro-esophageal reflux disease with esophagitis: Secondary | ICD-10-CM | POA: Diagnosis not present

## 2019-02-08 DIAGNOSIS — Z1389 Encounter for screening for other disorder: Secondary | ICD-10-CM | POA: Diagnosis not present

## 2019-06-11 DIAGNOSIS — K210 Gastro-esophageal reflux disease with esophagitis: Secondary | ICD-10-CM | POA: Diagnosis not present

## 2019-06-11 DIAGNOSIS — E7849 Other hyperlipidemia: Secondary | ICD-10-CM | POA: Diagnosis not present

## 2019-06-11 DIAGNOSIS — I10 Essential (primary) hypertension: Secondary | ICD-10-CM | POA: Diagnosis not present

## 2019-06-11 DIAGNOSIS — Z0000 Encounter for general adult medical examination without abnormal findings: Secondary | ICD-10-CM | POA: Diagnosis not present

## 2019-06-11 DIAGNOSIS — E1142 Type 2 diabetes mellitus with diabetic polyneuropathy: Secondary | ICD-10-CM | POA: Diagnosis not present

## 2020-02-11 DIAGNOSIS — Z79899 Other long term (current) drug therapy: Secondary | ICD-10-CM | POA: Diagnosis not present

## 2020-02-11 DIAGNOSIS — I252 Old myocardial infarction: Secondary | ICD-10-CM | POA: Diagnosis not present

## 2020-02-11 DIAGNOSIS — M722 Plantar fascial fibromatosis: Secondary | ICD-10-CM | POA: Diagnosis not present

## 2020-02-11 DIAGNOSIS — K219 Gastro-esophageal reflux disease without esophagitis: Secondary | ICD-10-CM | POA: Diagnosis not present

## 2020-02-11 DIAGNOSIS — M79671 Pain in right foot: Secondary | ICD-10-CM | POA: Diagnosis not present

## 2020-02-11 DIAGNOSIS — M7731 Calcaneal spur, right foot: Secondary | ICD-10-CM | POA: Diagnosis not present

## 2020-02-11 DIAGNOSIS — E1121 Type 2 diabetes mellitus with diabetic nephropathy: Secondary | ICD-10-CM | POA: Diagnosis not present

## 2020-02-11 DIAGNOSIS — Z7984 Long term (current) use of oral hypoglycemic drugs: Secondary | ICD-10-CM | POA: Diagnosis not present

## 2020-02-11 DIAGNOSIS — Z87891 Personal history of nicotine dependence: Secondary | ICD-10-CM | POA: Diagnosis not present

## 2020-02-11 DIAGNOSIS — E1140 Type 2 diabetes mellitus with diabetic neuropathy, unspecified: Secondary | ICD-10-CM | POA: Diagnosis not present

## 2020-02-11 DIAGNOSIS — I2510 Atherosclerotic heart disease of native coronary artery without angina pectoris: Secondary | ICD-10-CM | POA: Diagnosis not present

## 2020-02-11 DIAGNOSIS — E785 Hyperlipidemia, unspecified: Secondary | ICD-10-CM | POA: Diagnosis not present

## 2020-02-11 DIAGNOSIS — Z8619 Personal history of other infectious and parasitic diseases: Secondary | ICD-10-CM | POA: Diagnosis not present

## 2020-02-11 DIAGNOSIS — I509 Heart failure, unspecified: Secondary | ICD-10-CM | POA: Diagnosis not present

## 2020-03-03 DIAGNOSIS — I10 Essential (primary) hypertension: Secondary | ICD-10-CM | POA: Diagnosis not present

## 2020-03-03 DIAGNOSIS — E7849 Other hyperlipidemia: Secondary | ICD-10-CM | POA: Diagnosis not present

## 2020-03-03 DIAGNOSIS — M1009 Idiopathic gout, multiple sites: Secondary | ICD-10-CM | POA: Diagnosis not present

## 2020-03-03 DIAGNOSIS — Z0000 Encounter for general adult medical examination without abnormal findings: Secondary | ICD-10-CM | POA: Diagnosis not present

## 2020-03-03 DIAGNOSIS — E1142 Type 2 diabetes mellitus with diabetic polyneuropathy: Secondary | ICD-10-CM | POA: Diagnosis not present

## 2020-03-03 DIAGNOSIS — K2100 Gastro-esophageal reflux disease with esophagitis, without bleeding: Secondary | ICD-10-CM | POA: Diagnosis not present

## 2020-04-02 DIAGNOSIS — Z955 Presence of coronary angioplasty implant and graft: Secondary | ICD-10-CM | POA: Diagnosis not present

## 2020-04-02 DIAGNOSIS — K56609 Unspecified intestinal obstruction, unspecified as to partial versus complete obstruction: Secondary | ICD-10-CM | POA: Diagnosis not present

## 2020-04-02 DIAGNOSIS — I7090 Unspecified atherosclerosis: Secondary | ICD-10-CM | POA: Diagnosis not present

## 2020-04-02 DIAGNOSIS — K567 Ileus, unspecified: Secondary | ICD-10-CM | POA: Diagnosis not present

## 2020-04-02 DIAGNOSIS — M109 Gout, unspecified: Secondary | ICD-10-CM | POA: Diagnosis not present

## 2020-04-02 DIAGNOSIS — E785 Hyperlipidemia, unspecified: Secondary | ICD-10-CM | POA: Diagnosis not present

## 2020-04-02 DIAGNOSIS — K429 Umbilical hernia without obstruction or gangrene: Secondary | ICD-10-CM | POA: Diagnosis not present

## 2020-04-02 DIAGNOSIS — Z87891 Personal history of nicotine dependence: Secondary | ICD-10-CM | POA: Diagnosis not present

## 2020-04-02 DIAGNOSIS — I252 Old myocardial infarction: Secondary | ICD-10-CM | POA: Diagnosis not present

## 2020-04-02 DIAGNOSIS — E119 Type 2 diabetes mellitus without complications: Secondary | ICD-10-CM | POA: Diagnosis not present

## 2020-04-02 DIAGNOSIS — Z20822 Contact with and (suspected) exposure to covid-19: Secondary | ICD-10-CM | POA: Diagnosis not present

## 2020-04-02 DIAGNOSIS — K760 Fatty (change of) liver, not elsewhere classified: Secondary | ICD-10-CM | POA: Diagnosis not present

## 2020-04-02 DIAGNOSIS — K56600 Partial intestinal obstruction, unspecified as to cause: Secondary | ICD-10-CM | POA: Diagnosis not present

## 2020-04-02 DIAGNOSIS — I10 Essential (primary) hypertension: Secondary | ICD-10-CM | POA: Diagnosis not present

## 2020-04-02 DIAGNOSIS — K6389 Other specified diseases of intestine: Secondary | ICD-10-CM | POA: Diagnosis not present

## 2020-04-02 DIAGNOSIS — Z7984 Long term (current) use of oral hypoglycemic drugs: Secondary | ICD-10-CM | POA: Diagnosis not present

## 2020-04-02 DIAGNOSIS — K56699 Other intestinal obstruction unspecified as to partial versus complete obstruction: Secondary | ICD-10-CM | POA: Diagnosis not present

## 2020-04-02 DIAGNOSIS — Z4682 Encounter for fitting and adjustment of non-vascular catheter: Secondary | ICD-10-CM | POA: Diagnosis not present

## 2020-04-02 DIAGNOSIS — I2510 Atherosclerotic heart disease of native coronary artery without angina pectoris: Secondary | ICD-10-CM | POA: Diagnosis not present

## 2020-04-10 DIAGNOSIS — K56699 Other intestinal obstruction unspecified as to partial versus complete obstruction: Secondary | ICD-10-CM | POA: Diagnosis not present

## 2020-04-15 DIAGNOSIS — K760 Fatty (change of) liver, not elsewhere classified: Secondary | ICD-10-CM | POA: Diagnosis not present

## 2020-04-15 DIAGNOSIS — R1084 Generalized abdominal pain: Secondary | ICD-10-CM | POA: Diagnosis not present

## 2020-04-15 DIAGNOSIS — R1012 Left upper quadrant pain: Secondary | ICD-10-CM | POA: Diagnosis not present

## 2020-04-15 DIAGNOSIS — Z87891 Personal history of nicotine dependence: Secondary | ICD-10-CM | POA: Diagnosis not present

## 2020-04-15 DIAGNOSIS — R110 Nausea: Secondary | ICD-10-CM | POA: Diagnosis not present

## 2020-06-02 DIAGNOSIS — Z0000 Encounter for general adult medical examination without abnormal findings: Secondary | ICD-10-CM | POA: Diagnosis not present

## 2020-06-02 DIAGNOSIS — E1143 Type 2 diabetes mellitus with diabetic autonomic (poly)neuropathy: Secondary | ICD-10-CM | POA: Diagnosis not present

## 2020-06-02 DIAGNOSIS — E785 Hyperlipidemia, unspecified: Secondary | ICD-10-CM | POA: Diagnosis not present

## 2020-06-02 DIAGNOSIS — M25473 Effusion, unspecified ankle: Secondary | ICD-10-CM | POA: Diagnosis not present

## 2020-06-02 DIAGNOSIS — I10 Essential (primary) hypertension: Secondary | ICD-10-CM | POA: Diagnosis not present

## 2020-09-02 DIAGNOSIS — I10 Essential (primary) hypertension: Secondary | ICD-10-CM | POA: Diagnosis not present

## 2020-09-02 DIAGNOSIS — E785 Hyperlipidemia, unspecified: Secondary | ICD-10-CM | POA: Diagnosis not present

## 2020-09-02 DIAGNOSIS — E1143 Type 2 diabetes mellitus with diabetic autonomic (poly)neuropathy: Secondary | ICD-10-CM | POA: Diagnosis not present

## 2020-09-02 DIAGNOSIS — M25571 Pain in right ankle and joints of right foot: Secondary | ICD-10-CM | POA: Diagnosis not present

## 2020-10-24 DIAGNOSIS — H524 Presbyopia: Secondary | ICD-10-CM | POA: Diagnosis not present

## 2020-10-24 DIAGNOSIS — H52223 Regular astigmatism, bilateral: Secondary | ICD-10-CM | POA: Diagnosis not present

## 2020-10-24 DIAGNOSIS — E119 Type 2 diabetes mellitus without complications: Secondary | ICD-10-CM | POA: Diagnosis not present

## 2020-10-26 DIAGNOSIS — H5213 Myopia, bilateral: Secondary | ICD-10-CM | POA: Diagnosis not present

## 2020-12-10 DIAGNOSIS — Z0000 Encounter for general adult medical examination without abnormal findings: Secondary | ICD-10-CM | POA: Diagnosis not present

## 2020-12-10 DIAGNOSIS — I10 Essential (primary) hypertension: Secondary | ICD-10-CM | POA: Diagnosis not present

## 2020-12-10 DIAGNOSIS — E1143 Type 2 diabetes mellitus with diabetic autonomic (poly)neuropathy: Secondary | ICD-10-CM | POA: Diagnosis not present

## 2020-12-10 DIAGNOSIS — M25571 Pain in right ankle and joints of right foot: Secondary | ICD-10-CM | POA: Diagnosis not present

## 2020-12-10 DIAGNOSIS — E785 Hyperlipidemia, unspecified: Secondary | ICD-10-CM | POA: Diagnosis not present

## 2021-03-17 DIAGNOSIS — E1143 Type 2 diabetes mellitus with diabetic autonomic (poly)neuropathy: Secondary | ICD-10-CM | POA: Diagnosis not present

## 2021-03-17 DIAGNOSIS — E785 Hyperlipidemia, unspecified: Secondary | ICD-10-CM | POA: Diagnosis not present

## 2021-03-17 DIAGNOSIS — I10 Essential (primary) hypertension: Secondary | ICD-10-CM | POA: Diagnosis not present

## 2021-03-17 DIAGNOSIS — Z0000 Encounter for general adult medical examination without abnormal findings: Secondary | ICD-10-CM | POA: Diagnosis not present

## 2021-03-17 DIAGNOSIS — M25571 Pain in right ankle and joints of right foot: Secondary | ICD-10-CM | POA: Diagnosis not present

## 2021-07-06 DIAGNOSIS — E785 Hyperlipidemia, unspecified: Secondary | ICD-10-CM | POA: Diagnosis not present

## 2021-07-06 DIAGNOSIS — E1143 Type 2 diabetes mellitus with diabetic autonomic (poly)neuropathy: Secondary | ICD-10-CM | POA: Diagnosis not present

## 2021-07-06 DIAGNOSIS — M25571 Pain in right ankle and joints of right foot: Secondary | ICD-10-CM | POA: Diagnosis not present

## 2021-07-06 DIAGNOSIS — I10 Essential (primary) hypertension: Secondary | ICD-10-CM | POA: Diagnosis not present

## 2021-10-07 DIAGNOSIS — M7522 Bicipital tendinitis, left shoulder: Secondary | ICD-10-CM | POA: Diagnosis not present

## 2021-10-07 DIAGNOSIS — M7712 Lateral epicondylitis, left elbow: Secondary | ICD-10-CM | POA: Diagnosis not present

## 2021-10-07 DIAGNOSIS — M79602 Pain in left arm: Secondary | ICD-10-CM | POA: Diagnosis not present

## 2021-12-15 DIAGNOSIS — M25571 Pain in right ankle and joints of right foot: Secondary | ICD-10-CM | POA: Diagnosis not present

## 2021-12-15 DIAGNOSIS — I10 Essential (primary) hypertension: Secondary | ICD-10-CM | POA: Diagnosis not present

## 2021-12-15 DIAGNOSIS — E785 Hyperlipidemia, unspecified: Secondary | ICD-10-CM | POA: Diagnosis not present

## 2021-12-15 DIAGNOSIS — K580 Irritable bowel syndrome with diarrhea: Secondary | ICD-10-CM | POA: Diagnosis not present

## 2021-12-15 DIAGNOSIS — R69 Illness, unspecified: Secondary | ICD-10-CM | POA: Diagnosis not present

## 2021-12-15 DIAGNOSIS — E1143 Type 2 diabetes mellitus with diabetic autonomic (poly)neuropathy: Secondary | ICD-10-CM | POA: Diagnosis not present

## 2021-12-15 DIAGNOSIS — Z0000 Encounter for general adult medical examination without abnormal findings: Secondary | ICD-10-CM | POA: Diagnosis not present

## 2021-12-17 NOTE — Congregational Nurse Program (Signed)
°  Dept: [phone]   Congregational Nurse Program Note  Date of Encounter: 12/17/2021  Past Medical History: Past Medical History:  Diagnosis Date   Anal fissure    Arthritis    Asthma    Carotid artery disease (HCC)    Doppler, June, 2014, 1-39% bilateral   CHF (congestive heart failure) (HCC)    Initially post MI, then improved.   Coronary artery disease    Inferior MI November, 2011, bare-metal stent large circumflex, moderate LAD disease  /   catheterization June, 2012 LAD unchanged, moderate in-stent restenosis circumflex, improved LV function, EF 45%, medical therapy   Depression    Depression    Diabetes mellitus without complication (HCC)    type 2- on po meds    Dizziness    November, 2013   Ejection fraction < 50%    EF 30% echo November, 2011 (MI)  / EF 40%, echo, months after MI  /  EF 45%, cardiac catheterization, June, 201 to   GERD (gastroesophageal reflux disease)    Hepatitis C    High cholesterol    History of alcohol abuse    History of cocaine abuse    in 80's, ex-IVDU   History of kidney stones    Hypertension    Myocardial infarction [facility])    2011    Pulmonary nodule     Encounter Details: State he was lifting a TV and made a wrong move  and hurt his left arm. Suggested he be seen by his PCP or at the Urgent Care. Agreed with suggestion. BP 158/102 P 72 Jenene Slicker RN

## 2021-12-25 DIAGNOSIS — N1330 Unspecified hydronephrosis: Secondary | ICD-10-CM | POA: Diagnosis not present

## 2021-12-25 DIAGNOSIS — N200 Calculus of kidney: Secondary | ICD-10-CM | POA: Diagnosis not present

## 2021-12-25 DIAGNOSIS — K760 Fatty (change of) liver, not elsewhere classified: Secondary | ICD-10-CM | POA: Diagnosis not present

## 2021-12-31 DIAGNOSIS — R1084 Generalized abdominal pain: Secondary | ICD-10-CM | POA: Diagnosis not present

## 2022-01-26 ENCOUNTER — Other Ambulatory Visit: Payer: Self-pay

## 2022-01-26 ENCOUNTER — Ambulatory Visit (INDEPENDENT_AMBULATORY_CARE_PROVIDER_SITE_OTHER): Payer: Medicare Other | Admitting: Surgery

## 2022-01-26 VITALS — BP 144/90 | HR 94 | Temp 98.2°F | Resp 18 | Ht 70.0 in | Wt 219.0 lb

## 2022-01-26 DIAGNOSIS — R109 Unspecified abdominal pain: Secondary | ICD-10-CM

## 2022-01-26 NOTE — Progress Notes (Signed)
[facility] History and Physical  Reason for Referral: Gallstones Referring Physician: Dr. Janene Harvey  Chief Complaint   New Patient (Initial Visit)     Angel Donovan is a 56 y.o. male.  HPI: Patient presents for evaluation for gallstones.  He states that he has been having a diffuse abdominal pain for at least the last 9 weeks.  He describes his pain as starting in his lower abdomen and will radiate up towards his upper abdomen.  The pain is not constant, but it seems to randomly bother him without any significant underlying cause.  When he develops the pain, it is not uncommon that he will need to have a bowel movement shortly thereafter.  He occasionally states that he will have pain after eating, but when he develops the pain, it is in his lower abdomen.  He does confirm some episodes of nausea with 3 episodes of vomiting in the last 9 weeks.  He currently takes Pepcid for reflux, and has been doing so for at least 10 to 15 years.  His last colonoscopy was 5 years ago, and he does not believe anything abnormal was found on this scope.  He does however have a personal history of polyps, and a family history of colon cancer in his father, who was diagnosed at the age of 60.  He has never undergone an EGD.  He has a history of hepatitis C, which is treated with interferon.  He has no history of abdominal surgeries.  He quit smoking several years ago, has been sober for over 23 years, and denies use of illegal drugs.  Past Medical History:  Diagnosis Date   Anal fissure    Arthritis    Asthma    Carotid artery disease (HCC)    Doppler, June, 2014, 1-39% bilateral   CHF (congestive heart failure) (HCC)    Initially post MI, then improved.   Coronary artery disease    Inferior MI November, 2011, bare-metal stent large circumflex, moderate LAD disease  /   catheterization June, 2012 LAD unchanged, moderate in-stent restenosis circumflex, improved LV function, EF 45%, medical  therapy   Depression    Depression    Diabetes mellitus without complication (HCC)    type 2- on po meds    Dizziness    November, 2013   Ejection fraction < 50%    EF 30% echo November, 2011 (MI)  / EF 40%, echo, months after MI  /  EF 45%, cardiac catheterization, June, 201 to   GERD (gastroesophageal reflux disease)    Hepatitis C    High cholesterol    History of alcohol abuse    History of cocaine abuse    in 80's, ex-IVDU   History of kidney stones    Hypertension    Myocardial infarction [facility])    2011    Pulmonary nodule     Past Surgical History:  Procedure Laterality Date   CARDIAC CATHETERIZATION     with PCI RCA 10/19/2010   coronary stents     CYSTOSCOPY/URETEROSCOPY/HOLMIUM LASER/STENT PLACEMENT Right 02/24/2018   Procedure: RIGHT URETEROSCOPY HOLMIUM LASER STENT PLACEMENT RETROGRADE PYELOGRAM;  Surgeon: Crista Elliot, MD;  Location: [facility];  Service: Urology;  Laterality: Right;    Family History  Problem Relation Age of Onset   Coronary artery disease Father    Cancer Father        lymphoma   Cancer - Colon Father    Ulcerative colitis Father  Social History   Tobacco Use   Smoking status: Former    Packs/day: 1.00    Years: 35.00    Pack years: 35.00    Types: Cigarettes    Start date: 06/03/1976    Quit date: 10/18/2010    Years since quitting: 11.2   Smokeless tobacco: Never  Vaping Use   Vaping Use: Never used  Substance Use Topics   Alcohol use: No    Alcohol/week: 0.0 standard drinks    Comment: quit in 2000   Drug use: Yes    Types: Cocaine    Comment: cocaine. quit in 2000    Medications: I have reviewed the patient's current medications. Allergies as of 01/26/2022   No Known Allergies      Medication List        Accurate as of January 26, 2022  2:02 PM. If you have any questions, ask your nurse or doctor.          STOP taking these medications    brexpiprazole 2 MG Tabs tablet Commonly known as: REXULTI Stopped  by: Serafino Burciaga A Ade Stmarie, DO   DULoxetine 20 MG capsule Commonly known as: CYMBALTA Stopped by: Shimika Ames A Jersi Mcmaster, DO       TAKE these medications    albuterol 108 (90 Base) MCG/ACT inhaler Commonly known as: ProAir HFA Inhale 1 puff into the lungs every 6 (six) hours as needed for wheezing or shortness of breath.   allopurinol 100 MG tablet Commonly known as: ZYLOPRIM Take by mouth.   amLODipine 5 MG tablet Commonly known as: NORVASC Take 1 tablet by mouth daily.   aspirin EC 81 MG tablet Take 81 mg by mouth daily.   atorvastatin 40 MG tablet Commonly known as: LIPITOR Take 40 mg by mouth daily.   benazepril 40 MG tablet Commonly known as: LOTENSIN Take 40 mg by mouth daily.   carvedilol 6.25 MG tablet Commonly known as: COREG Take 1 tablet by mouth 2 (two) times daily.   chlorthalidone 25 MG tablet Commonly known as: HYGROTON Take 25 mg by mouth daily.   doxepin 100 MG capsule Commonly known as: SINEQUAN Take 100 mg by mouth at bedtime.   famotidine 20 MG tablet Commonly known as: PEPCID Take 20 mg by mouth 2 (two) times daily.   FLUoxetine 20 MG capsule Commonly known as: PROZAC Take 40 mg by mouth daily.   metFORMIN 500 MG tablet Commonly known as: GLUCOPHAGE Take 500 mg by mouth daily.   nitroGLYCERIN 0.4 MG SL tablet Commonly known as: NITROSTAT Place 1 tablet (0.4 mg total) under the tongue every 5 (five) minutes as needed for chest pain.   olmesartan 40 MG tablet Commonly known as: BENICAR Take 40 mg by mouth daily.   OMEGA-3 FISH OIL PO Take by mouth.   tiZANidine 4 MG tablet Commonly known as: ZANAFLEX Take by mouth. What changed: Another medication with the same name was removed. Continue taking this medication, and follow the directions you see here. Changed by: Juliona Vales A Miku Udall, DO         ROS:  Constitutional: negative for chills, fatigue, and fevers Eyes: negative for visual disturbance and pain Ears, nose,  mouth, throat, and face: negative for ear drainage, sore throat, and sinus problems Respiratory: negative for cough, wheezing, and shortness of breath Cardiovascular: negative for chest pain and palpitations Gastrointestinal: positive for abdominal pain, nausea, and reflux symptoms Genitourinary:positive for dysuria and frequency, negative for urinary retention Integument/breast: negative for dryness and rash Hematologic/lymphatic:  negative for bleeding and lymphadenopathy Musculoskeletal:negative for back pain, neck pain, and joint pain Neurological: negative for dizziness, tremors, and numbness Endocrine: negative for temperature intolerance  Blood pressure (!) 144/90, pulse 94, temperature 98.2 F (36.8 C), temperature source Other (Comment), resp. rate 18, height 5\' 10"  (1.778 m), weight 219 lb (99.3 kg), SpO2 95 %. Physical Exam Vitals reviewed.  Constitutional:      Appearance: Normal appearance.  HENT:     Head: Normocephalic and atraumatic.  Eyes:     Extraocular Movements: Extraocular movements intact.     Pupils: Pupils are equal, round, and reactive to light.  Cardiovascular:     Rate and Rhythm: Normal rate.  Pulmonary:     Effort: Pulmonary effort is normal.  Abdominal:     Comments: Abdomen soft, minimal distention, no percussion tenderness, no significant tenderness to palpation, negative Murphy's, no rigidity, guarding, rebound tenderness, small reducible umbilical hernia  Musculoskeletal:        General: Normal range of motion.     Cervical back: Normal range of motion.  Skin:    General: Skin is warm and dry.  Neurological:     General: No focal deficit present.     Mental Status: He is alert and oriented to person, place, and time.  Psychiatric:        Mood and Affect: Mood normal.        Behavior: Behavior normal.    Results: Abdominal ultrasound (12/31/2021): Impression 1.  Hepatic steatosis 2.  Gallbladder wall thickening without evidence of gallstones  or acute cholecystitis.  This could be due to underdistention, hepatitis, or volume overload.  Clinical correlation is suggested. 3.  Right nephrolithiasis with mild hydronephrosis.  Left kidney is unremarkable   Assessment & Plan:  Angel Donovan is a 56 y.o. male who presented for evaluation of his abdominal pain.  -Patient with abdominal ultrasound demonstrating no gallstones.  Wall thickening noted on abdominal ultrasound, but could be related to underdistention, hepatitis, or volume overload. -Patient's symptoms are not characteristic for gallbladder pathology, as he is having lower abdominal pain with episodes of diarrhea -We discussed that while his gallbladder may be contributing to his abdominal pain, I suspect that he has some other underlying pathology causing the majority of his pain.  I recommend that he be evaluated by a GI doctor and evaluated for possible EGD and colonoscopy -I further explained that if work-up by GI is unrevealing, then we can attempt a cholecystectomy to see if this improves some of his symptoms.  But, again, given his symptoms are not characteristic for gallbladder issues, would recommend other work-up prior to cholecystectomy -Referral sent to GI  All questions were answered to the satisfaction of the patient.  56, DO [facility] [address] [place], [place] [phone] (office)

## 2022-01-27 ENCOUNTER — Encounter (INDEPENDENT_AMBULATORY_CARE_PROVIDER_SITE_OTHER): Payer: Self-pay | Admitting: *Deleted

## 2022-03-30 DIAGNOSIS — J440 Chronic obstructive pulmonary disease with acute lower respiratory infection: Secondary | ICD-10-CM | POA: Diagnosis not present

## 2022-03-30 DIAGNOSIS — M25571 Pain in right ankle and joints of right foot: Secondary | ICD-10-CM | POA: Diagnosis not present

## 2022-03-30 DIAGNOSIS — E1143 Type 2 diabetes mellitus with diabetic autonomic (poly)neuropathy: Secondary | ICD-10-CM | POA: Diagnosis not present

## 2022-03-30 DIAGNOSIS — Z0000 Encounter for general adult medical examination without abnormal findings: Secondary | ICD-10-CM | POA: Diagnosis not present

## 2022-03-30 DIAGNOSIS — E7849 Other hyperlipidemia: Secondary | ICD-10-CM | POA: Diagnosis not present

## 2022-03-30 DIAGNOSIS — R0602 Shortness of breath: Secondary | ICD-10-CM | POA: Diagnosis not present

## 2022-03-30 DIAGNOSIS — I10 Essential (primary) hypertension: Secondary | ICD-10-CM | POA: Diagnosis not present

## 2022-03-30 DIAGNOSIS — K580 Irritable bowel syndrome with diarrhea: Secondary | ICD-10-CM | POA: Diagnosis not present

## 2022-04-01 ENCOUNTER — Encounter (INDEPENDENT_AMBULATORY_CARE_PROVIDER_SITE_OTHER): Payer: Self-pay | Admitting: Gastroenterology

## 2022-04-01 ENCOUNTER — Ambulatory Visit (INDEPENDENT_AMBULATORY_CARE_PROVIDER_SITE_OTHER): Payer: Medicare Other | Admitting: Gastroenterology

## 2022-04-01 VITALS — BP 137/90 | HR 97 | Temp 98.3°F | Ht 70.0 in | Wt 202.7 lb

## 2022-04-01 DIAGNOSIS — R1030 Lower abdominal pain, unspecified: Secondary | ICD-10-CM | POA: Diagnosis not present

## 2022-04-01 DIAGNOSIS — K529 Noninfective gastroenteritis and colitis, unspecified: Secondary | ICD-10-CM

## 2022-04-01 DIAGNOSIS — R109 Unspecified abdominal pain: Secondary | ICD-10-CM | POA: Insufficient documentation

## 2022-04-01 DIAGNOSIS — K7400 Hepatic fibrosis, unspecified: Secondary | ICD-10-CM | POA: Diagnosis not present

## 2022-04-01 DIAGNOSIS — R197 Diarrhea, unspecified: Secondary | ICD-10-CM

## 2022-04-01 NOTE — Patient Instructions (Signed)
Schedule EGD and colonoscopy ?Please ask PCP if possible to switch olmesartan to other anithypertensive ?Perform blood workup ?Perform stool workup ? ?

## 2022-04-01 NOTE — Progress Notes (Signed)
Maylon Peppers, M.D. ?Gastroenterology & Hepatology ?[facility] ?[address] ?[place], [place] [address] ?Primary Care Physician: ?Neale Burly, MD ?[address] ?[place] [address] ? ?Referring MD: PCP ?  ?Chief Complaint:  abdominal pain and diarrhea ?  ?History of Present Illness: ?Angel Donovan is a 56 y.o. male with PMH IV drug use, hepatitis C s/p IFN achieved SVR, diabetes , CAD s/p stent , HFdE, gout, who presents for evaluation of abdominal pain and diarrhea. ?  ?Patient reports that for the last 4 months he has presented recurrent episodes of abdominal pian described as cramping in his lower abdomen, with some episodes occasionally in the left upper quadrant. He reports that he has constant gurgling. He also reports that he has had recurrent episodes of diarrhea. He states he is having 4-5 Bms per day which have a watery consistency but no melena and hematochezia. He passes flatulence frequently which has a foul smell and has bloating frequently. ?  ?States he has presented chronic history of abdominal pain for multiple years, at least 15 years. This was managed with Pepcid in the past but his pain has recently recurred. ?  ?The patient denies having any nausea, vomiting, fever, chills, hematochezia, melena, hematemesis, abdominal pain, diarrhea, jaundice, pruritus . States he has lost 27 lb in the last 3 months, but he reports eating as usual. ?  ?The patient was seen by general surgery (Dr. Okey Dupre) on 01/26/2022 who considered his abdominal pain was not presenting difficult presentation for gallbladder pathology and he was referred to gastroenterology for further evaluation of his chronic abdominal pain and diarrhea. ? ?Has been taking olmesartan, unclear for how long but he reports that at least for a year. Per chart it was prescribed in 11/2021. ?  ?Notably, the patient underwent an abdominal ultrasound on 12/31/2021 that showed hepatic  steatosis, gallbladder wall thickening without evidence of gallstones or cholecystitis.  Most recent blood work-up from 04/15/2020 showed CBC WBC 12.3, platelets 254, Hb 17.6, ALT 63, AST 30, creatinine 1.54, BUN 22, albumin 4.2, total bilirubin 0.3. ? ?Upon review of the medical chart, he was seen in our clinic in 2017.  At that time he had HCVRNA checked which came back negative but he had a liver elastography showing some F3 and F4 fibrosis. ? ?Last [id number] ?Last Colonoscopy:possibly 3 years ago at [facility], he reports it was normal ? ?FHx: neg for any gastrointestinal/liver disease, father colon cancer at age 72, [place] father ?Social: quit smoking and alcohol 23 years ago. Does cocaine every 6 months, used to do it IV but has not done IV recently. Last use 3 weeks ago ?Surgical: no abdominal surgeries ? ?Past Medical History: ?Past Medical History:  ?Diagnosis Date  ? Anal fissure   ? Arthritis   ? Asthma   ? Carotid artery disease ([place])   ? Doppler, June, 2014, 1-39% bilateral  ? CHF (congestive heart failure) ([place])   ? Initially post MI, then improved.  ? Coronary artery disease   ? 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 Depression   ? Depression   ? Diabetes mellitus without complication ([place])   ? type 2- on po meds   ? Dizziness   ? November, 2013  ? Ejection fraction < 50%   ? EF 30% echo November,  2011 (MI)  / EF 40%, echo, months after MI  /  EF 45%, cardiac catheterization, June, 201 to  ? GERD (gastroesophageal reflux disease)   ? Hepatitis C   ? High cholesterol   ? History of alcohol abuse   ? History of cocaine abuse ([place])   ? in 80's, ex-IVDU  ? History of kidney stones   ? Hypertension   ? Myocardial infarction [facility])   ? 2011   ? Pulmonary nodule   ? ? ?Past Surgical History: ?Past Surgical History:  ?Procedure  Laterality Date  ? CARDIAC CATHETERIZATION    ? with PCI RCA 10/19/2010  ? coronary stents    ? CYSTOSCOPY/URETEROSCOPY/HOLMIUM LASER/STENT PLACEMENT Right 02/24/2018  ? Procedure: RIGHT URETEROSCOPY HOLMIUM LASER STENT PLACEMENT RETROGRADE PYELOGRAM;  Surgeon: Lucas Mallow, MD;  Location: [facility];  Service: Urology;  Laterality: Right;  ? ? ?Family History: ?Family History  ?Problem Relation Age of Onset  ? Coronary artery disease Father   ? Cancer Father   ?     lymphoma  ? Cancer - Colon Father   ? Ulcerative colitis Father   ? ? ?Social History: ?Social History  ? ?Tobacco Use  ?Smoking Status Former  ? Packs/day: 1.00  ? Years: 35.00  ? Pack years: 35.00  ? Types: Cigarettes  ? Start date: 06/03/1976  ? Quit date: 10/18/2010  ? Years since quitting: 11.4  ?Smokeless Tobacco Never  ? ?Social History  ? ?Substance and Sexual Activity  ?Alcohol Use Not Currently  ? Comment: quit in 2000  ? ?Social History  ? ?Substance and Sexual Activity  ?Drug Use Yes  ? Types: Cocaine  ? ? ?Allergies: ?No Known Allergies ? ?Medications: ?Current Outpatient Medications  ?Medication Sig Dispense Refill  ? ADVAIR DISKUS 500-50 MCG/ACT AEPB 1 puff 2 (two) times daily.    ? albuterol (PROAIR HFA) 108 (90 BASE) MCG/ACT inhaler Inhale 1 puff into the lungs every 6 (six) hours as needed for wheezing or shortness of breath.    ? allopurinol (ZYLOPRIM) 100 MG tablet Take 100 mg by mouth daily.    ? amLODipine (NORVASC) 5 MG tablet Take 1 tablet by mouth daily.    ? aspirin EC 81 MG tablet Take 81 mg by mouth daily.    ? atorvastatin (LIPITOR) 40 MG tablet Take 40 mg by mouth daily.    ? benazepril (LOTENSIN) 40 MG tablet Take 40 mg by mouth daily.  0  ? carvedilol (COREG) 6.25 MG tablet Take 1 tablet by mouth 2 (two) times daily.    ? chlorthalidone (HYGROTON) 25 MG tablet Take 25 mg by mouth daily.  0  ? dicyclomine (BENTYL) 10 MG capsule Take 10 mg by mouth 3 (three) times daily. Prn.    ? doxepin (SINEQUAN) 100 MG capsule Take 100  mg by mouth at bedtime.    ? famotidine (PEPCID) 20 MG tablet Take 20 mg by mouth 2 (two) times daily.    ? FLUoxetine (PROZAC) 20 MG capsule Take 40 mg by mouth daily.     ? metFORMIN (GLUCOPHAGE) 500 MG tablet Take 500 mg by mouth daily.  0  ? methylPREDNISolone (MEDROL DOSEPAK) 4 MG TBPK tablet Take by mouth as directed.    ? nitroGLYCERIN (NITROSTAT) 0.4 MG SL tablet Place 1 tablet (0.4 mg total) under the tongue every 5 (five) minutes as needed for chest pain. 30 tablet 0  ? olmesartan (BENICAR) 40 MG tablet Take 40 mg by mouth daily.    ?  Omega-3 Fatty Acids (OMEGA-3 FISH OIL PO) Take by mouth daily at 6 (six) AM.    ? tiZANidine (ZANAFLEX) 4 MG tablet Take 4 mg by mouth at bedtime.    ? ?No current facility-administered medications for this visit.  ? ? ?Review of Systems: ?GENERAL: negative for malaise, night sweats ?HEENT: No changes in hearing or vision, no nose bleeds or other nasal problems. ?NECK: Negative for lumps, goiter, pain and significant neck swelling ?RESPIRATORY: Negative for cough, wheezing ?CARDIOVASCULAR: Negative for chest pain, leg swelling, palpitations, orthopnea ?GI: SEE HPI ?MUSCULOSKELETAL: Negative for joint pain or swelling, back pain, and muscle pain. ?SKIN: Negative for lesions, rash ?PSYCH: Negative for sleep disturbance, mood disorder and recent psychosocial stressors. ?HEMATOLOGY Negative for prolonged bleeding, bruising easily, and swollen nodes. ?ENDOCRINE: Negative for cold or heat intolerance, polyuria, polydipsia and goiter. ?NEURO: negative for tremor, gait imbalance, syncope and seizures. ?The remainder of the review of systems is noncontributory. ? ? ?Physical Exam: ?BP 137/90 (BP Location: Left Arm, Patient Position: Sitting, Cuff Size: Large)   Pulse 97   Temp 98.3 ?F (36.8 ?C) (Oral)   Ht 5\' 10"  (1.778 m)   Wt 202 lb 11.2 oz (91.9 kg)   BMI 29.08 kg/m?  ?GENERAL: The patient is AO x3, in no acute distress. Overweight. ?HEENT: Head is normocephalic and  atraumatic. EOMI are intact. Mouth is well hydrated and without lesions. ?NECK: Supple. No masses ?LUNGS: Clear to auscultation. No presence of rhonchi/wheezing/rales. Adequate chest expansion ?HEART: RRR, normal s1 and s2. ?

## 2022-04-01 NOTE — Progress Notes (Signed)
Katrinka Blazing, M.D. ?Gastroenterology & Hepatology ?[facility] ?[address] ?[place], [place] [address] ?Primary Care Physician: ?Toma Deiters, MD ?[address] ?[place] [address] ? ?Referring MD: PCP ? ?Chief Complaint:  abdominal pain and diarrhea ? ?History of Present Illness: ?Angel Donovan is a 56 y.o. male with PMH IV dru use, hepatitis C s/p IFN achieved SVR, who presents for evaluation of abdominal pain and diarrhea ? ?Patient reports that for the last 4 months he has presented recurrent episodes of abdominal pian described as cramping in his lower abdomen, with some episodes occasionally in the left upper quadrant. He reports that he has constant gurgling. He also reports that he has had recurrent episodes of diarrhea. He states he is having 4-5 Bms per day which have a watery consistency but no melena and hematochezia. He passes flatulence frequently which has a foul smell. ? ?States he has presented chronic history of abdominal pain for multiple years, at least 15 years. This was managed with Pepcid in the past ? ?The patient denies having any nausea, vomiting, fever, chills, hematochezia, melena, hematemesis, abdominal distention, abdominal pain, diarrhea, jaundice, pruritus or weight loss. ? ?The patient was seen by general surgery (Dr. Robyne Peers) on 01/26/2022 who considered his abdominal pain was not presenting difficult presentation for gallbladder pathology and he was referred to gastroenterology for further evaluation of his chronic abdominal pain and diarrhea. ? ?Notably, the patient underwent an abdominal ultrasound on 12/31/2021 that showed hepatic steatosis, gallbladder wall thickening without evidence of gallstones or cholecystitis.  Most recent blood work-up from 04/15/2020 showed CBC with although cell count of 12.3, platelets, 17.6 AST 30, creatinine 1.54, BUN 22, albumin 4.2, total bilirubin 0.3. ? ?NOTE: The office visit had  to be terminated earlier that expected as there was concern for natural gas leak in the building. All employees and patient had to be evacuated from the building ? ?Past Medical History: ?Past Medical History:  ?Diagnosis Date  ? Anal fissure   ? Arthritis   ? Asthma   ? Carotid artery disease (HCC)   ? Doppler, June, 2014, 1-39% bilateral  ? CHF (congestive heart failure) (HCC)   ? Initially post MI, then improved.  ? Coronary artery disease   ? 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 Depression   ? Depression   ? Diabetes mellitus without complication (HCC)   ? type 2- on po meds   ? Dizziness   ? November, 2013  ? Ejection fraction < 50%   ? EF 30% echo November, 2011 (MI)  / EF 40%, echo, months after MI  /  EF 45%, cardiac catheterization, June, 201 to  ? GERD (gastroesophageal reflux disease)   ? Hepatitis C   ? High cholesterol   ? History of alcohol abuse   ? History of cocaine abuse (HCC)   ? in 80's, ex-IVDU  ? History of kidney stones   ? Hypertension   ? Myocardial infarction [facility])   ? 2011   ? Pulmonary nodule   ? ? ?Past Surgical History: ?Past Surgical History:  ?Procedure Laterality Date  ? CARDIAC CATHETERIZATION    ? with PCI RCA 10/19/2010  ? coronary stents    ? CYSTOSCOPY/URETEROSCOPY/HOLMIUM LASER/STENT PLACEMENT Right 02/24/2018  ? Procedure: RIGHT URETEROSCOPY HOLMIUM LASER STENT PLACEMENT RETROGRADE  PYELOGRAM;  Surgeon: Crista ElliotBell, Eugene D III, MD;  Location: [facility];  Service: Urology;  Laterality: Right;  ? ? ?Family History: ?Family History  ?Problem Relation Age of Onset  ? Coronary artery disease Father   ? Cancer Father   ?     lymphoma  ? Cancer - Colon Father   ? Ulcerative colitis Father   ? ? ?Social History: ?Social History  ? ?Tobacco Use  ?Smoking Status Former  ? Packs/day: 1.00  ? Years: 35.00  ? Pack  years: 35.00  ? Types: Cigarettes  ? Start date: 06/03/1976  ? Quit date: 10/18/2010  ? Years since quitting: 11.4  ?Smokeless Tobacco Never  ? ?Social History  ? ?Substance and Sexual Activity  ?Alcohol Use Not Currently  ? Comment: quit in 2000  ? ?Social History  ? ?Substance and Sexual Activity  ?Drug Use Yes  ? Types: Cocaine  ? ? ?Allergies: ?No Known Allergies ? ?Medications: ?Current Outpatient Medications  ?Medication Sig Dispense Refill  ? ADVAIR DISKUS 500-50 MCG/ACT AEPB 1 puff 2 (two) times daily.    ? albuterol (PROAIR HFA) 108 (90 BASE) MCG/ACT inhaler Inhale 1 puff into the lungs every 6 (six) hours as needed for wheezing or shortness of breath.    ? allopurinol (ZYLOPRIM) 100 MG tablet Take 100 mg by mouth daily.    ? amLODipine (NORVASC) 5 MG tablet Take 1 tablet by mouth daily.    ? aspirin EC 81 MG tablet Take 81 mg by mouth daily.    ? atorvastatin (LIPITOR) 40 MG tablet Take 40 mg by mouth daily.    ? benazepril (LOTENSIN) 40 MG tablet Take 40 mg by mouth daily.  0  ? carvedilol (COREG) 6.25 MG tablet Take 1 tablet by mouth 2 (two) times daily.    ? chlorthalidone (HYGROTON) 25 MG tablet Take 25 mg by mouth daily.  0  ? dicyclomine (BENTYL) 10 MG capsule Take 10 mg by mouth 3 (three) times daily. Prn.    ? doxepin (SINEQUAN) 100 MG capsule Take 100 mg by mouth at bedtime.    ? famotidine (PEPCID) 20 MG tablet Take 20 mg by mouth 2 (two) times daily.    ? FLUoxetine (PROZAC) 20 MG capsule Take 40 mg by mouth daily.     ? metFORMIN (GLUCOPHAGE) 500 MG tablet Take 500 mg by mouth daily.  0  ? methylPREDNISolone (MEDROL DOSEPAK) 4 MG TBPK tablet Take by mouth as directed.    ? nitroGLYCERIN (NITROSTAT) 0.4 MG SL tablet Place 1 tablet (0.4 mg total) under the tongue every 5 (five) minutes as needed for chest pain. 30 tablet 0  ? olmesartan (BENICAR) 40 MG tablet Take 40 mg by mouth daily.    ? Omega-3 Fatty Acids (OMEGA-3 FISH OIL PO) Take by mouth daily at 6 (six) AM.    ? tiZANidine (ZANAFLEX) 4 MG  tablet Take 4 mg by mouth at bedtime.    ? ?No current facility-administered medications for this visit.  ? ? ?Review of Systems: ?GENERAL: negative for malaise, night sweats ?HEENT: No changes in hearing or vision, no nose bleeds or other nasal problems. ?NECK: Negative for lumps, goiter, pain and significant neck swelling ?RESPIRATORY: Negative for cough, wheezing ?CARDIOVASCULAR: Negative for chest pain, leg swelling, palpitations, orthopnea ?GI: SEE HPI ?MUSCULOSKELETAL: Negative for joint pain or swelling, back pain, and muscle pain. ?SKIN: Negative for lesions, rash ?PSYCH: Negative for sleep disturbance, mood disorder and recent psychosocial stressors. ?HEMATOLOGY Negative for prolonged bleeding,  bruising easily, and swollen nodes. ?ENDOCRINE: Negative for cold or heat intolerance, polyuria, polydipsia and goiter. ?NEURO: negative for tremor, gait imbalance, syncope and seizures. ?The remainder of the review of systems is noncontributory. ? ? ?Physical Exam: ?BP (!) 154/89 (BP Location: Left Arm, Patient Position: Sitting, Cuff Size: Large)   Pulse 76   Temp (!) 97.3 ?F (36.3 ?C) (Oral)   Ht 5\' 10"  (1.778 m)   Wt 202 lb 11.2 oz (91.9 kg)   BMI 29.08 kg/m?  ?Not performed ? ? ?Imaging/Labs: ?as above ? ?I personally reviewed and interpreted the available labs, imaging and endoscopic files. ? ?Impression and Plan: ? ?NOTE: The office visit had to be terminated earlier that expected as there was concern for natural gas leak in the building. All employees and patient had to be evacuated from the building ? ?Patient will be rescheduled to be see in clinic ? ? , MD ?Gastroenterology and Hepatology ?[facility] ? ?

## 2022-05-10 DIAGNOSIS — K7400 Hepatic fibrosis, unspecified: Secondary | ICD-10-CM | POA: Diagnosis not present

## 2022-05-10 DIAGNOSIS — R1030 Lower abdominal pain, unspecified: Secondary | ICD-10-CM | POA: Diagnosis not present

## 2022-05-10 DIAGNOSIS — K529 Noninfective gastroenteritis and colitis, unspecified: Secondary | ICD-10-CM | POA: Diagnosis not present

## 2022-05-14 DIAGNOSIS — K529 Noninfective gastroenteritis and colitis, unspecified: Secondary | ICD-10-CM | POA: Diagnosis not present

## 2022-05-14 DIAGNOSIS — R1030 Lower abdominal pain, unspecified: Secondary | ICD-10-CM | POA: Diagnosis not present

## 2022-05-14 DIAGNOSIS — K7400 Hepatic fibrosis, unspecified: Secondary | ICD-10-CM | POA: Diagnosis not present

## 2022-05-17 LAB — CBC WITH DIFFERENTIAL/PLATELET
Absolute Monocytes: 679 cells/uL (ref 200–950)
Basophils Absolute: 63 cells/uL (ref 0–200)
Basophils Relative: 0.8 %
Eosinophils Absolute: 648 cells/uL — ABNORMAL HIGH (ref 15–500)
Eosinophils Relative: 8.2 %
HCT: 53.4 % — ABNORMAL HIGH (ref 38.5–50.0)
Hemoglobin: 18.2 g/dL — ABNORMAL HIGH (ref 13.2–17.1)
Lymphs Abs: 1311 cells/uL (ref 850–3900)
MCH: 31 pg (ref 27.0–33.0)
MCHC: 34.1 g/dL (ref 32.0–36.0)
MCV: 91 fL (ref 80.0–100.0)
MPV: 11.4 fL (ref 7.5–12.5)
Monocytes Relative: 8.6 %
Neutro Abs: 5198 cells/uL (ref 1500–7800)
Neutrophils Relative %: 65.8 %
Platelets: 224 10*3/uL (ref 140–400)
RBC: 5.87 10*6/uL — ABNORMAL HIGH (ref 4.20–5.80)
RDW: 12.7 % (ref 11.0–15.0)
Total Lymphocyte: 16.6 %
WBC: 7.9 10*3/uL (ref 3.8–10.8)

## 2022-05-17 LAB — LIVER FIBROSIS, FIBROTEST-ACTITEST
ALT: 16 U/L (ref 9–46)
Alpha-2-Macroglobulin: 197 mg/dL (ref 106–279)
Apolipoprotein A1: 113 mg/dL (ref 94–176)
Bilirubin: 0.3 mg/dL (ref 0.2–1.2)
Fibrosis Score: 0.21
GGT: 17 U/L (ref 3–85)
Haptoglobin: 135 mg/dL (ref 43–212)
Necroinflammat ACT Score: 0.05
Reference ID: 4425189

## 2022-05-17 LAB — HCV RNA, QUANT REAL-TIME PCR W/REFLEX
HCV RNA, PCR, QN (Log): <1.18 LogIU/mL
HCV RNA, PCR, QN: <15 IU/mL

## 2022-05-17 LAB — COMPREHENSIVE METABOLIC PANEL
AG Ratio: 2.1 (calc) (ref 1.0–2.5)
ALT: 15 U/L (ref 9–46)
AST: 13 U/L (ref 10–35)
Albumin: 4.2 g/dL (ref 3.6–5.1)
Alkaline phosphatase (APISO): 69 U/L (ref 35–144)
BUN/Creatinine Ratio: 7 (calc) (ref 6–22)
BUN: 12 mg/dL (ref 7–25)
CO2: 22 mmol/L (ref 20–32)
Calcium: 9.1 mg/dL (ref 8.6–10.3)
Chloride: 105 mmol/L (ref 98–110)
Creat: 1.69 mg/dL — ABNORMAL HIGH (ref 0.70–1.30)
Globulin: 2 g/dL (calc) (ref 1.9–3.7)
Glucose, Bld: 180 mg/dL — ABNORMAL HIGH (ref 65–139)
Potassium: 4 mmol/L (ref 3.5–5.3)
Sodium: 139 mmol/L (ref 135–146)
Total Bilirubin: 0.4 mg/dL (ref 0.2–1.2)
Total Protein: 6.2 g/dL (ref 6.1–8.1)

## 2022-05-17 LAB — CELIAC DISEASE PANEL
(tTG) Ab, IgA: <1 U/mL
(tTG) Ab, IgG: <1 U/mL
Gliadin IgA: 1.1 U/mL
Gliadin IgG: 5.5 U/mL
Immunoglobulin A: 205 mg/dL (ref 47–310)

## 2022-05-17 LAB — C-REACTIVE PROTEIN: CRP: 3.9 mg/L (ref <8.0)

## 2022-05-17 LAB — HEPATITIS B SURFACE ANTIGEN: Hepatitis B Surface Ag: NONREACTIVE

## 2022-05-18 LAB — GASTROINTESTINAL PATHOGEN PNL

## 2022-05-18 LAB — C. DIFFICILE GDH AND TOXIN A/B
GDH ANTIGEN: NOT DETECTED
MICRO NUMBER:: 13567299
SPECIMEN QUALITY:: ADEQUATE
TOXIN A AND B: NOT DETECTED

## 2022-05-20 ENCOUNTER — Other Ambulatory Visit (INDEPENDENT_AMBULATORY_CARE_PROVIDER_SITE_OTHER): Payer: Self-pay | Admitting: *Deleted

## 2022-05-20 DIAGNOSIS — R197 Diarrhea, unspecified: Secondary | ICD-10-CM

## 2022-05-21 ENCOUNTER — Other Ambulatory Visit (INDEPENDENT_AMBULATORY_CARE_PROVIDER_SITE_OTHER): Payer: Self-pay | Admitting: *Deleted

## 2022-05-21 DIAGNOSIS — R197 Diarrhea, unspecified: Secondary | ICD-10-CM

## 2022-05-26 ENCOUNTER — Telehealth (INDEPENDENT_AMBULATORY_CARE_PROVIDER_SITE_OTHER): Payer: Self-pay

## 2022-05-26 ENCOUNTER — Encounter (INDEPENDENT_AMBULATORY_CARE_PROVIDER_SITE_OTHER): Payer: Self-pay

## 2022-05-26 ENCOUNTER — Other Ambulatory Visit (INDEPENDENT_AMBULATORY_CARE_PROVIDER_SITE_OTHER): Payer: Self-pay

## 2022-05-26 MED ORDER — PEG 3350-KCL-NA BICARB-NACL 420 G PO SOLR
4000.0000 mL | ORAL | 0 refills | Status: AC
Start: 2022-05-26 — End: ?

## 2022-05-26 NOTE — Telephone Encounter (Signed)
Lenardo Westwood Ann Antoniette Peake, CMA  ?

## 2022-05-27 ENCOUNTER — Encounter (INDEPENDENT_AMBULATORY_CARE_PROVIDER_SITE_OTHER): Payer: Self-pay

## 2022-06-08 NOTE — Patient Instructions (Signed)
Angel Donovan  06/08/2022     @PREFPERIOPPHARMACY @   Your procedure is scheduled on  06/14/2022.   Report to 06/16/2022 at  0845  A.M.   Call this number if you have problems the morning of surgery:  [phone]   Remember:  Follow the diet and prep instructions given to you by the office.    Use your inhalers before you come and bring your rescue inhaler with you.     Take these medicines the morning of surgery with A SIP OF WATER                       norvasc, coreg, pepcid, prozac.     Do not wear jewelry, make-up or nail polish.  Do not wear lotions, powders, or perfumes, or deodorant.  Do not shave 48 hours prior to surgery.  Men may shave face and neck.  Do not bring valuables to the hospital.  [facility] is not responsible for any belongings or valuables.  Contacts, dentures or bridgework may not be worn into surgery.  Leave your suitcase in the car.  After surgery it may be brought to your room.  For patients admitted to the hospital, discharge time will be determined by your treatment team.  Patients discharged the day of surgery will not be allowed to drive home and must  have someone with them for 24 hours.    Special instructions:   DO NOT smoke tobacco or vape for 24 hours before your procedure.  Please read over the following fact sheets that you were given. Anesthesia Post-op Instructions and Care and Recovery After Surgery      Upper Endoscopy, Adult, Care After This sheet gives you information about how to care for yourself after your procedure. Your health care provider may also give you more specific instructions. If you have problems or questions, contact your health care provider. What can I expect after the procedure? After the procedure, it is common to have: A sore throat. Mild stomach pain or discomfort. Bloating. Nausea. Follow these instructions at home:  Follow instructions from your health care provider about what  to eat or drink after your procedure. Return to your normal activities as told by your health care provider. Ask your health care provider what activities are safe for you. Take over-the-counter and prescription medicines only as told by your health care provider. If you were given a sedative during the procedure, it can affect you for several hours. Do not drive or operate machinery until your health care provider says that it is safe. Keep all follow-up visits as told by your health care provider. This is important. Contact a health care provider if you have: A sore throat that lasts longer than one day. Trouble swallowing. Get help right away if: You vomit blood or your vomit looks like coffee grounds. You have: A fever. Bloody, black, or tarry stools. A severe sore throat or you cannot swallow. Difficulty breathing. Severe pain in your chest or abdomen. Summary After the procedure, it is common to have a sore throat, mild stomach discomfort, bloating, and nausea. If you were given a sedative during the procedure, it can affect you for several hours. Do not drive or operate machinery until your health care provider says that it is safe. Follow instructions from your health care provider about what to eat or drink after your procedure. Return to your normal activities as  told by your health care provider. This information is not intended to replace advice given to you by your health care provider. Make sure you discuss any questions you have with your health care provider. Document Revised: 09/14/2019 Document Reviewed: 04/10/2018 Elsevier Patient Education  2023 [employer] Colonoscopy, Adult, Care After The following information offers guidance on how to care for yourself after your procedure. Your health care provider may also give you more specific instructions. If you have problems or questions, contact your health care provider. What can I expect after the procedure? After the  procedure, it is common to have: A small amount of blood in your stool for 24 hours after the procedure. Some gas. Mild cramping or bloating of your abdomen. Follow these instructions at home: Eating and drinking  Drink enough fluid to keep your urine pale yellow. Follow instructions from your health care provider about eating or drinking restrictions. Resume your normal diet as told by your health care provider. Avoid heavy or fried foods that are hard to digest. Activity Rest as told by your health care provider. Avoid sitting for a long time without moving. Get up to take short walks every 1-2 hours. This is important to improve blood flow and breathing. Ask for help if you feel weak or unsteady. Return to your normal activities as told by your health care provider. Ask your health care provider what activities are safe for you. Managing cramping and bloating  Try walking around when you have cramps or feel bloated. If directed, apply heat to your abdomen as told by your health care provider. Use the heat source that your health care provider recommends, such as a moist heat pack or a heating pad. Place a towel between your skin and the heat source. Leave the heat on for 20-30 minutes. Remove the heat if your skin turns bright red. This is especially important if you are unable to feel pain, heat, or cold. You have a greater risk of getting burned. General instructions If you were given a sedative during the procedure, it can affect you for several hours. Do not drive or operate machinery until your health care provider says that it is safe. For the first 24 hours after the procedure: Do not sign important documents. Do not drink alcohol. Do your regular daily activities at a slower pace than normal. Eat soft foods that are easy to digest. Take over-the-counter and prescription medicines only as told by your health care provider. Keep all follow-up visits. This is important. Contact  a health care provider if: You have blood in your stool 2-3 days after the procedure. Get help right away if: You have more than a small spotting of blood in your stool. You have large blood clots in your stool. You have swelling of your abdomen. You have nausea or vomiting. You have a fever. You have increasing pain in your abdomen that is not relieved with medicine. These symptoms may be an emergency. Get help right away. Call 911. Do not wait to see if the symptoms will go away. Do not drive yourself to the hospital. Summary After the procedure, it is common to have a small amount of blood in your stool. You may also have mild cramping and bloating of your abdomen. If you were given a sedative during the procedure, it can affect you for several hours. Do not drive or operate machinery until your health care provider says that it is safe. Get help right away if you have  a lot of blood in your stool, nausea or vomiting, a fever, or increased pain in your abdomen. This information is not intended to replace advice given to you by your health care provider. Make sure you discuss any questions you have with your health care provider. Document Revised: 07/01/2021 Document Reviewed: 07/01/2021 Elsevier Patient Education  2023 [employer]. Monitored Anesthesia Care, Care After This sheet gives you information about how to care for yourself after your procedure. Your health care provider may also give you more specific instructions. If you have problems or questions, contact your health care provider. What can I expect after the procedure? After the procedure, it is common to have: Tiredness. Forgetfulness about what happened after the procedure. Impaired judgment for important decisions. Nausea or vomiting. Some difficulty with balance. Follow these instructions at home: For the time period you were told by your health care provider:     Rest as needed. Do not participate in activities  where you could fall or become injured. Do not drive or use machinery. Do not drink alcohol. Do not take sleeping pills or medicines that cause drowsiness. Do not make important decisions or sign legal documents. Do not take care of children on your own. Eating and drinking Follow the diet that is recommended by your health care provider. Drink enough fluid to keep your urine pale yellow. If you vomit: Drink water, juice, or soup when you can drink without vomiting. Make sure you have little or no nausea before eating solid foods. General instructions Have a responsible adult stay with you for the time you are told. It is important to have someone help care for you until you are awake and alert. Take over-the-counter and prescription medicines only as told by your health care provider. If you have sleep apnea, surgery and certain medicines can increase your risk for breathing problems. Follow instructions from your health care provider about wearing your sleep device: Anytime you are sleeping, including during daytime naps. While taking prescription pain medicines, sleeping medicines, or medicines that make you drowsy. Avoid smoking. Keep all follow-up visits as told by your health care provider. This is important. Contact a health care provider if: You keep feeling nauseous or you keep vomiting. You feel light-headed. You are still sleepy or having trouble with balance after 24 hours. You develop a rash. You have a fever. You have redness or swelling around the IV site. Get help right away if: You have trouble breathing. You have new-onset confusion at home. Summary For several hours after your procedure, you may feel tired. You may also be forgetful and have poor judgment. Have a responsible adult stay with you for the time you are told. It is important to have someone help care for you until you are awake and alert. Rest as told. Do not drive or operate machinery. Do not drink  alcohol or take sleeping pills. Get help right away if you have trouble breathing, or if you suddenly become confused. This information is not intended to replace advice given to you by your health care provider. Make sure you discuss any questions you have with your health care provider. Document Revised: 10/13/2021 Document Reviewed: 10/11/2019 Elsevier Patient Education  2023 [employer].

## 2022-06-10 ENCOUNTER — Encounter (HOSPITAL_COMMUNITY)
Admission: RE | Admit: 2022-06-10 | Discharge: 2022-06-10 | Disposition: A | Discharge status: Home / Self Care | Payer: Medicare Other | Source: Ambulatory Visit | Attending: Gastroenterology | Admitting: Gastroenterology

## 2022-06-10 ENCOUNTER — Encounter (HOSPITAL_COMMUNITY): Payer: Self-pay

## 2022-06-10 DIAGNOSIS — F1490 Cocaine use, unspecified, uncomplicated: Secondary | ICD-10-CM

## 2022-06-10 DIAGNOSIS — F1414 Cocaine abuse with cocaine-induced mood disorder: Secondary | ICD-10-CM

## 2022-06-10 DIAGNOSIS — B182 Chronic viral hepatitis C: Secondary | ICD-10-CM

## 2022-06-10 DIAGNOSIS — I10 Essential (primary) hypertension: Secondary | ICD-10-CM

## 2022-06-10 DIAGNOSIS — K7400 Hepatic fibrosis, unspecified: Secondary | ICD-10-CM

## 2022-06-14 ENCOUNTER — Encounter (HOSPITAL_COMMUNITY): Admission: RE | Payer: Self-pay | Source: Home / Self Care

## 2022-06-14 ENCOUNTER — Ambulatory Visit (HOSPITAL_COMMUNITY): Admission: RE | Admit: 2022-06-14 | Payer: Medicare Other | Source: Home / Self Care | Admitting: Gastroenterology

## 2022-06-14 SURGERY — COLONOSCOPY WITH PROPOFOL
Anesthesia: Monitor Anesthesia Care

## 2022-07-08 ENCOUNTER — Ambulatory Visit (INDEPENDENT_AMBULATORY_CARE_PROVIDER_SITE_OTHER): Payer: Medicare Other | Admitting: Gastroenterology

## 2022-12-01 DIAGNOSIS — J440 Chronic obstructive pulmonary disease with acute lower respiratory infection: Secondary | ICD-10-CM | POA: Diagnosis not present

## 2022-12-01 DIAGNOSIS — K580 Irritable bowel syndrome with diarrhea: Secondary | ICD-10-CM | POA: Diagnosis not present

## 2022-12-01 DIAGNOSIS — E1143 Type 2 diabetes mellitus with diabetic autonomic (poly)neuropathy: Secondary | ICD-10-CM | POA: Diagnosis not present

## 2022-12-01 DIAGNOSIS — I10 Essential (primary) hypertension: Secondary | ICD-10-CM | POA: Diagnosis not present

## 2022-12-01 DIAGNOSIS — E7849 Other hyperlipidemia: Secondary | ICD-10-CM | POA: Diagnosis not present

## 2022-12-01 DIAGNOSIS — M25571 Pain in right ankle and joints of right foot: Secondary | ICD-10-CM | POA: Diagnosis not present

## 2022-12-03 DIAGNOSIS — E1143 Type 2 diabetes mellitus with diabetic autonomic (poly)neuropathy: Secondary | ICD-10-CM | POA: Diagnosis not present

## 2022-12-03 DIAGNOSIS — M25571 Pain in right ankle and joints of right foot: Secondary | ICD-10-CM | POA: Diagnosis not present

## 2022-12-03 DIAGNOSIS — I10 Essential (primary) hypertension: Secondary | ICD-10-CM | POA: Diagnosis not present

## 2022-12-03 DIAGNOSIS — J440 Chronic obstructive pulmonary disease with acute lower respiratory infection: Secondary | ICD-10-CM | POA: Diagnosis not present

## 2022-12-03 DIAGNOSIS — K580 Irritable bowel syndrome with diarrhea: Secondary | ICD-10-CM | POA: Diagnosis not present

## 2022-12-09 DIAGNOSIS — H35033 Hypertensive retinopathy, bilateral: Secondary | ICD-10-CM | POA: Diagnosis not present

## 2022-12-21 DIAGNOSIS — H532 Diplopia: Secondary | ICD-10-CM | POA: Diagnosis not present

## 2022-12-21 DIAGNOSIS — E119 Type 2 diabetes mellitus without complications: Secondary | ICD-10-CM | POA: Diagnosis not present

## 2022-12-21 DIAGNOSIS — H5021 Vertical strabismus, right eye: Secondary | ICD-10-CM | POA: Diagnosis not present

## 2022-12-22 DIAGNOSIS — Z8673 Personal history of transient ischemic attack (TIA), and cerebral infarction without residual deficits: Secondary | ICD-10-CM | POA: Diagnosis not present

## 2022-12-22 DIAGNOSIS — I10 Essential (primary) hypertension: Secondary | ICD-10-CM | POA: Diagnosis not present

## 2022-12-24 DIAGNOSIS — Z8673 Personal history of transient ischemic attack (TIA), and cerebral infarction without residual deficits: Secondary | ICD-10-CM | POA: Diagnosis not present

## 2022-12-24 DIAGNOSIS — I10 Essential (primary) hypertension: Secondary | ICD-10-CM | POA: Diagnosis not present

## 2023-01-04 DIAGNOSIS — H532 Diplopia: Secondary | ICD-10-CM | POA: Diagnosis not present

## 2023-01-04 DIAGNOSIS — I998 Other disorder of circulatory system: Secondary | ICD-10-CM | POA: Diagnosis not present

## 2023-01-04 DIAGNOSIS — H538 Other visual disturbances: Secondary | ICD-10-CM | POA: Diagnosis not present

## 2023-01-04 DIAGNOSIS — G459 Transient cerebral ischemic attack, unspecified: Secondary | ICD-10-CM | POA: Diagnosis not present

## 2023-02-24 DIAGNOSIS — H5021 Vertical strabismus, right eye: Secondary | ICD-10-CM | POA: Diagnosis not present

## 2023-02-24 DIAGNOSIS — H532 Diplopia: Secondary | ICD-10-CM | POA: Diagnosis not present

## 2023-03-31 DIAGNOSIS — Z8673 Personal history of transient ischemic attack (TIA), and cerebral infarction without residual deficits: Secondary | ICD-10-CM | POA: Diagnosis not present

## 2023-03-31 DIAGNOSIS — Z0000 Encounter for general adult medical examination without abnormal findings: Secondary | ICD-10-CM | POA: Diagnosis not present

## 2023-03-31 DIAGNOSIS — I739 Peripheral vascular disease, unspecified: Secondary | ICD-10-CM | POA: Diagnosis not present

## 2023-03-31 DIAGNOSIS — E785 Hyperlipidemia, unspecified: Secondary | ICD-10-CM | POA: Diagnosis not present

## 2023-03-31 DIAGNOSIS — E1143 Type 2 diabetes mellitus with diabetic autonomic (poly)neuropathy: Secondary | ICD-10-CM | POA: Diagnosis not present

## 2023-03-31 DIAGNOSIS — I10 Essential (primary) hypertension: Secondary | ICD-10-CM | POA: Diagnosis not present

## 2023-04-26 DIAGNOSIS — M7121 Synovial cyst of popliteal space [Baker], right knee: Secondary | ICD-10-CM | POA: Diagnosis not present

## 2023-04-26 DIAGNOSIS — I739 Peripheral vascular disease, unspecified: Secondary | ICD-10-CM | POA: Diagnosis not present

## 2023-04-26 DIAGNOSIS — M79661 Pain in right lower leg: Secondary | ICD-10-CM | POA: Diagnosis not present

## 2023-08-16 ENCOUNTER — Other Ambulatory Visit: Payer: Self-pay

## 2023-08-16 ENCOUNTER — Emergency Department (HOSPITAL_COMMUNITY): Payer: 59

## 2023-08-16 ENCOUNTER — Encounter (HOSPITAL_COMMUNITY): Payer: Self-pay

## 2023-08-16 ENCOUNTER — Emergency Department (HOSPITAL_COMMUNITY)
Admission: EM | Admit: 2023-08-16 | Discharge: 2023-08-16 | Disposition: A | Discharge status: Home / Self Care | Payer: 59 | Attending: Emergency Medicine | Admitting: Emergency Medicine

## 2023-08-16 DIAGNOSIS — I509 Heart failure, unspecified: Secondary | ICD-10-CM | POA: Insufficient documentation

## 2023-08-16 DIAGNOSIS — W182XXA Fall in (into) shower or empty bathtub, initial encounter: Secondary | ICD-10-CM | POA: Diagnosis not present

## 2023-08-16 DIAGNOSIS — S2241XA Multiple fractures of ribs, right side, initial encounter for closed fracture: Secondary | ICD-10-CM | POA: Diagnosis not present

## 2023-08-16 DIAGNOSIS — Z7982 Long term (current) use of aspirin: Secondary | ICD-10-CM | POA: Diagnosis not present

## 2023-08-16 DIAGNOSIS — Z79899 Other long term (current) drug therapy: Secondary | ICD-10-CM | POA: Insufficient documentation

## 2023-08-16 DIAGNOSIS — I2510 Atherosclerotic heart disease of native coronary artery without angina pectoris: Secondary | ICD-10-CM | POA: Diagnosis not present

## 2023-08-16 DIAGNOSIS — S299XXA Unspecified injury of thorax, initial encounter: Secondary | ICD-10-CM | POA: Diagnosis present

## 2023-08-16 DIAGNOSIS — I110 Hypertensive heart disease with heart failure: Secondary | ICD-10-CM | POA: Insufficient documentation

## 2023-08-16 MED ORDER — HYDROCODONE-ACETAMINOPHEN 5-325 MG PO TABS: 1.0000 | ORAL_TABLET | ORAL | 0 refills | Status: DC | PRN

## 2023-08-16 MED ORDER — HYDROCODONE-ACETAMINOPHEN 5-325 MG PO TABS
2.0000 | ORAL_TABLET | Freq: Once | ORAL | Status: AC
  Administered 2023-08-16: 2 via ORAL
  Filled 2023-08-16: qty 2

## 2023-08-16 NOTE — ED Provider Notes (Signed)
EMERGENCY DEPARTMENT AT [facility] Provider Note   CSN: [id number] Arrival date & time: 08/16/23  1110     History  Chief Complaint  Patient presents with   Fall   Rib Injury    Angel Donovan is a 57 y.o. male with history of chronic hepatitis C, alcohol use, hyperlipidemia, depression, hypertension, CAD, CHF, carotid artery disease, NSTEMI, cocaine use, who presents to the emergency department complaining of right-sided rib pain.  Patient states that 4 days ago he slipped and fell in the shower, he landed directly on his right rib cage.  He is having significant pain with breathing, feels he cannot cough deeply.  Reports a history of rib fracture many years ago, and states this feels very similar.   Fall       Home Medications Prior to Admission medications   Medication Sig Start Date End Date Taking? Authorizing Provider  HYDROcodone-acetaminophen (NORCO/VICODIN) 5-325 MG tablet Take 1-2 tablets by mouth every 4 (four) hours as needed for severe pain. 08/16/23  Yes Sidney Kann T, PA-C  ADVAIR DISKUS 500-50 MCG/ACT AEPB Inhale 1 puff into the lungs daily. 03/30/22   [provider]  albuterol (PROAIR HFA) 108 (90 BASE) MCG/ACT inhaler Inhale 1 puff into the lungs every 6 (six) hours as needed for wheezing or shortness of breath. 11/11/14   Armandina Stammer I, NP  allopurinol (ZYLOPRIM) 100 MG tablet Take 100 mg by mouth at bedtime. 03/04/20   [provider]  amLODipine (NORVASC) 5 MG tablet Take 5 mg by mouth daily. 05/16/18   [provider]  aspirin EC 81 MG tablet Take 81 mg by mouth daily.    [provider]  atorvastatin (LIPITOR) 40 MG tablet Take 40 mg by mouth daily.    [provider]  benazepril (LOTENSIN) 40 MG tablet Take 40 mg by mouth daily. 12/24/17   [provider]  carvedilol (COREG) 25 MG tablet Take 25 mg by mouth 2 (two) times daily with a meal.    [provider]   chlorthalidone (HYGROTON) 25 MG tablet Take 25 mg by mouth at bedtime. 12/24/17   [provider]  diclofenac Sodium (VOLTAREN) 1 % GEL Apply 2 g topically at bedtime. 05/10/22   [provider]  dicyclomine (BENTYL) 10 MG capsule Take 10 mg by mouth 3 (three) times daily before meals. Prn. 03/12/22   [provider]  doxepin (SINEQUAN) 100 MG capsule Take 100 mg by mouth at bedtime.    [provider]  famotidine (PEPCID) 20 MG tablet Take 20 mg by mouth 2 (two) times daily.    [provider]  FLUoxetine (PROZAC) 20 MG tablet Take 40 mg by mouth daily. 02/16/16   [provider]  furosemide (LASIX) 20 MG tablet Take 10 mg by mouth daily. 05/03/22   [provider]  metFORMIN (GLUCOPHAGE) 500 MG tablet Take 500 mg by mouth daily. 12/24/17   [provider]  nitroGLYCERIN (NITROSTAT) 0.4 MG SL tablet Place 1 tablet (0.4 mg total) under the tongue every 5 (five) minutes as needed for chest pain. 05/22/18   Ivery Quale, PA-C  olmesartan (BENICAR) 40 MG tablet Take 40 mg by mouth daily. 12/15/21   [provider]  Omega-3 Fatty Acids (OMEGA-3 FISH OIL PO) Take 1 capsule by mouth 3 (three) times daily.    [provider]  polyethylene glycol-electrolytes (TRILYTE) 420 g solution Take 4,000 mLs by mouth as directed. 05/26/22   Levon Hedger  Alisia Ferrari, MD  tiZANidine (ZANAFLEX) 4 MG tablet Take 4 mg by mouth at bedtime. 03/04/20   [provider]      Allergies    Patient has no known allergies.    Review of Systems   Review of Systems  Musculoskeletal:        Right rib pain  All other systems reviewed and are negative.   Physical Exam Updated Vital Signs BP 138/79   Pulse 90   Temp 98 F (36.7 C)   Resp 20   Wt 91 kg   SpO2 94%   BMI 28.79 kg/m  Physical Exam Vitals and nursing note reviewed.  Constitutional:      Appearance: Normal appearance.  HENT:     Head: Normocephalic and atraumatic.   Eyes:     Conjunctiva/sclera: Conjunctivae normal.  Cardiovascular:     Rate and Rhythm: Normal rate and regular rhythm.  Pulmonary:     Effort: Pulmonary effort is normal. No respiratory distress.     Breath sounds: Normal breath sounds.  Chest:     Comments: Right lateral lower chest wall tenderness to palpation, no appreciable deformity. No paradoxical motion. Skin:    General: Skin is warm and dry.  Neurological:     General: No focal deficit present.     Mental Status: He is alert.  Psychiatric:        Mood and Affect: Mood normal.        Behavior: Behavior normal.     ED Results / Procedures / Treatments   Labs (all labs ordered are listed, but only abnormal results are displayed) Labs Reviewed - No data to display  EKG None  Radiology DG Ribs Unilateral W/Chest Right  Result Date: 08/16/2023 CLINICAL DATA:  Fall with right rib pain. EXAM: RIGHT RIBS AND CHEST - 3+ VIEW COMPARISON:  None Available. FINDINGS: Acute minimally displaced fractures are identified in the lateral right eighth and ninth ribs. No pneumothorax. Lungs otherwise clear. Cardiopericardial silhouette is at upper limits of normal for size. IMPRESSION: Acute minimally displaced fractures of the lateral right eighth and ninth ribs. Electronically Signed   By: Kennith Center M.D.   On: 08/16/2023 13:41    Procedures Procedures    Medications Ordered in ED Medications  HYDROcodone-acetaminophen (NORCO/VICODIN) 5-325 MG per tablet 2 tablet (2 tablets Oral Given 08/16/23 1447)    ED Course/ Medical Decision Making/ A&P                                 Medical Decision Making Amount and/or Complexity of Data Reviewed Radiology: ordered.  Risk Prescription drug management.   This patient is a 57 y.o. male  who presents to the ED for concern of R sided rib pain after fall.   Differential diagnoses prior to evaluation: The emergent differential diagnosis includes, but is not limited to,  rib  fractures, pneumothorax. This is not an exhaustive differential.   Past Medical History / Co-morbidities / Social History: chronic hepatitis C, alcohol use, hyperlipidemia, depression, hypertension, CAD, CHF, carotid artery disease, NSTEMI, cocaine use  Additional history: Chart reviewed. Pertinent results include: PDMP reviewed  Physical Exam: Physical exam performed. The pertinent findings include: Normal vital signs, no acute distress.  Right lateral lower chest wall tenderness to palpation, without obvious deformity.  No paradoxical motion. Equal lung sounds bilaterally.  Lab Tests/Imaging studies: I personally interpreted labs/imaging and the pertinent results include: X-ray  shows acute minimally displaced fractures of the lateral right eighth and ninth ribs. No pneumothorax. I agree with the radiologist interpretation.  Medications: I ordered medication including norco.  I have reviewed the patients home medicines and have made adjustments as needed.   Disposition: After consideration of the diagnostic results and the patients response to treatment, I feel that emergency department workup does not suggest an emergent condition requiring admission or immediate intervention beyond what has been performed at this time. The plan is: discharge to home with treatment of closed minimally displaced rib fractures. Will prescribe pain medication and patient given incentive spirometer. The patient is safe for discharge and has been instructed to return immediately for worsening symptoms, change in symptoms or any other concerns.  Final Clinical Impression(s) / ED Diagnoses Final diagnoses:  Closed fracture of multiple ribs of right side, initial encounter    Rx / DC Orders ED Discharge Orders          Ordered    HYDROcodone-acetaminophen (NORCO/VICODIN) 5-325 MG tablet  Every 4 hours PRN        08/16/23 1550           Portions of this report may have been transcribed using voice  recognition software. Every effort was made to ensure accuracy; however, inadvertent computerized transcription errors may be present.    Jeanella Flattery 08/16/23 1600    Terrilee Files, MD 08/16/23 1737

## 2023-08-16 NOTE — Discharge Instructions (Addendum)
You were seen in the ER for right sided rib pain.  As we discussed, you fractured your right 8th and 9th ribs. We normally treat this with pain medication, and prevent development of lung infections. I have sent a prescription of pain medication to your pharmacy, and we have given you the incentive spirometer device.  Please follow the instructions attached for how often to do this.   Continue to monitor how you're doing and return to the ER for new or worsening symptoms.

## 2023-08-16 NOTE — ED Triage Notes (Signed)
C/o mechanical fall in shower on Saturday hitting right ribs on shower.  Patient bracing right sided rib cage during triage.  Sob with inhalation.  Talking in full sentences

## 2023-12-27 ENCOUNTER — Emergency Department (HOSPITAL_COMMUNITY)
Admission: EM | Admit: 2023-12-27 | Discharge: 2023-12-27 | Discharge status: Against Medical Advice | Payer: 59 | Source: Home / Self Care

## 2023-12-29 DIAGNOSIS — E1143 Type 2 diabetes mellitus with diabetic autonomic (poly)neuropathy: Secondary | ICD-10-CM | POA: Diagnosis not present

## 2023-12-29 DIAGNOSIS — R0602 Shortness of breath: Secondary | ICD-10-CM | POA: Diagnosis not present

## 2023-12-29 DIAGNOSIS — I739 Peripheral vascular disease, unspecified: Secondary | ICD-10-CM | POA: Diagnosis not present

## 2023-12-29 DIAGNOSIS — Z0000 Encounter for general adult medical examination without abnormal findings: Secondary | ICD-10-CM | POA: Diagnosis not present

## 2023-12-29 DIAGNOSIS — E785 Hyperlipidemia, unspecified: Secondary | ICD-10-CM | POA: Diagnosis not present

## 2023-12-29 DIAGNOSIS — I10 Essential (primary) hypertension: Secondary | ICD-10-CM | POA: Diagnosis not present

## 2023-12-29 DIAGNOSIS — Z8673 Personal history of transient ischemic attack (TIA), and cerebral infarction without residual deficits: Secondary | ICD-10-CM | POA: Diagnosis not present

## 2023-12-29 DIAGNOSIS — M1009 Idiopathic gout, multiple sites: Secondary | ICD-10-CM | POA: Diagnosis not present

## 2023-12-30 DIAGNOSIS — Z87891 Personal history of nicotine dependence: Secondary | ICD-10-CM | POA: Diagnosis not present

## 2023-12-30 DIAGNOSIS — R0789 Other chest pain: Secondary | ICD-10-CM | POA: Diagnosis not present

## 2023-12-30 DIAGNOSIS — R0689 Other abnormalities of breathing: Secondary | ICD-10-CM | POA: Diagnosis not present

## 2023-12-30 DIAGNOSIS — Z743 Need for continuous supervision: Secondary | ICD-10-CM | POA: Diagnosis not present

## 2023-12-30 DIAGNOSIS — I5030 Unspecified diastolic (congestive) heart failure: Secondary | ICD-10-CM | POA: Diagnosis not present

## 2023-12-30 DIAGNOSIS — R079 Chest pain, unspecified: Secondary | ICD-10-CM | POA: Diagnosis not present

## 2023-12-30 DIAGNOSIS — I517 Cardiomegaly: Secondary | ICD-10-CM | POA: Diagnosis not present

## 2023-12-30 DIAGNOSIS — R0600 Dyspnea, unspecified: Secondary | ICD-10-CM | POA: Diagnosis not present

## 2023-12-30 DIAGNOSIS — I252 Old myocardial infarction: Secondary | ICD-10-CM | POA: Diagnosis not present

## 2023-12-30 DIAGNOSIS — I509 Heart failure, unspecified: Secondary | ICD-10-CM | POA: Diagnosis not present

## 2023-12-30 DIAGNOSIS — I499 Cardiac arrhythmia, unspecified: Secondary | ICD-10-CM | POA: Diagnosis not present

## 2023-12-30 DIAGNOSIS — R0602 Shortness of breath: Secondary | ICD-10-CM | POA: Diagnosis not present

## 2024-01-03 ENCOUNTER — Encounter (INDEPENDENT_AMBULATORY_CARE_PROVIDER_SITE_OTHER): Payer: Self-pay | Admitting: *Deleted

## 2024-02-28 ENCOUNTER — Encounter (HOSPITAL_COMMUNITY): Payer: Self-pay | Admitting: *Deleted

## 2024-02-28 ENCOUNTER — Other Ambulatory Visit: Payer: Self-pay

## 2024-02-28 ENCOUNTER — Emergency Department (HOSPITAL_COMMUNITY)

## 2024-02-28 ENCOUNTER — Emergency Department (HOSPITAL_COMMUNITY)
Admission: EM | Admit: 2024-02-28 | Discharge: 2024-02-28 | Disposition: A | Discharge status: Home / Self Care | Attending: Emergency Medicine | Admitting: Emergency Medicine

## 2024-02-28 DIAGNOSIS — Z7982 Long term (current) use of aspirin: Secondary | ICD-10-CM | POA: Insufficient documentation

## 2024-02-28 DIAGNOSIS — N200 Calculus of kidney: Secondary | ICD-10-CM

## 2024-02-28 DIAGNOSIS — N132 Hydronephrosis with renal and ureteral calculous obstruction: Secondary | ICD-10-CM | POA: Insufficient documentation

## 2024-02-28 DIAGNOSIS — N3289 Other specified disorders of bladder: Secondary | ICD-10-CM | POA: Diagnosis not present

## 2024-02-28 DIAGNOSIS — R109 Unspecified abdominal pain: Secondary | ICD-10-CM | POA: Diagnosis not present

## 2024-02-28 LAB — URINALYSIS, ROUTINE W REFLEX MICROSCOPIC
Bilirubin Urine: NEGATIVE
Glucose, UA: NEGATIVE mg/dL
Ketones, ur: NEGATIVE mg/dL
Leukocytes,Ua: NEGATIVE
Nitrite: NEGATIVE
Protein, ur: NEGATIVE mg/dL
RBC / HPF: >50 RBC/hpf (ref 0–5)
Specific Gravity, Urine: 1.009 (ref 1.005–1.030)
pH: 5 (ref 5.0–8.0)

## 2024-02-28 LAB — CBC
HCT: 57 % — ABNORMAL HIGH (ref 39.0–52.0)
Hemoglobin: 18.6 g/dL — ABNORMAL HIGH (ref 13.0–17.0)
MCH: 30.2 pg (ref 26.0–34.0)
MCHC: 32.6 g/dL (ref 30.0–36.0)
MCV: 92.5 fL (ref 80.0–100.0)
Platelets: 273 10*3/uL (ref 150–400)
RBC: 6.16 MIL/uL — ABNORMAL HIGH (ref 4.22–5.81)
RDW: 13.1 % (ref 11.5–15.5)
WBC: 10 10*3/uL (ref 4.0–10.5)
nRBC: 0 % (ref 0.0–0.2)

## 2024-02-28 LAB — LIPASE, BLOOD: Lipase: 54 U/L — ABNORMAL HIGH (ref 11–51)

## 2024-02-28 LAB — COMPREHENSIVE METABOLIC PANEL WITH GFR
ALT: 21 U/L (ref 0–44)
AST: 23 U/L (ref 15–41)
Albumin: 4.3 g/dL (ref 3.5–5.0)
Alkaline Phosphatase: 51 U/L (ref 38–126)
Anion gap: 8 (ref 5–15)
BUN: 22 mg/dL — ABNORMAL HIGH (ref 6–20)
CO2: 27 mmol/L (ref 22–32)
Calcium: 10 mg/dL (ref 8.9–10.3)
Chloride: 104 mmol/L (ref 98–111)
Creatinine, Ser: 1.12 mg/dL (ref 0.61–1.24)
GFR, Estimated: >60 mL/min (ref >60)
Glucose, Bld: 152 mg/dL — ABNORMAL HIGH (ref 70–99)
Potassium: 4.4 mmol/L (ref 3.5–5.1)
Sodium: 139 mmol/L (ref 135–145)
Total Bilirubin: 0.9 mg/dL (ref 0.0–1.2)
Total Protein: 7.3 g/dL (ref 6.5–8.1)

## 2024-02-28 MED ORDER — ONDANSETRON HCL 4 MG PO TABS: 4.0000 mg | ORAL_TABLET | Freq: Four times a day (QID) | ORAL | 0 refills | Status: AC

## 2024-02-28 MED ORDER — IOHEXOL 350 MG/ML SOLN
75.0000 mL | Freq: Once | INTRAVENOUS | Status: AC | PRN
  Administered 2024-02-28: 75 mL via INTRAVENOUS

## 2024-02-28 MED ORDER — TAMSULOSIN HCL 0.4 MG PO CAPS: 0.4000 mg | ORAL_CAPSULE | Freq: Every day | ORAL | 0 refills | Status: AC

## 2024-02-28 MED ORDER — OXYCODONE HCL 5 MG PO TABS: 5.0000 mg | ORAL_TABLET | Freq: Four times a day (QID) | ORAL | 0 refills | Status: AC | PRN

## 2024-02-28 MED ORDER — KETOROLAC TROMETHAMINE 30 MG/ML IJ SOLN
30.0000 mg | Freq: Once | INTRAMUSCULAR | Status: AC
  Administered 2024-02-28: 30 mg via INTRAVENOUS
  Filled 2024-02-28: qty 1

## 2024-02-28 NOTE — Discharge Instructions (Addendum)
 Today your labs and imaging show you have a kidney stone causing your symptoms.  The stone is currently a 2 x 2 x 3 stone in the left mid ureter. Currently, the symptoms are under control, and so we can safely discharge you. Most of the stones pass on their own, and we need to just control the pain. You may take Tylenol or ibuprofen every 6 hours as needed for pain.  I have also prescribed you pain medication for pain not controlled by this. Please pick up the medications I have prescribed you, including the pain medicine. Call the Urologist for an appointment, if the pain continues - even if it is tolerable. Come to the ER if the pain is intolerable. Also come back to the ER if there are fevers, chills, inability to keep any fluids down, confusion, large blood clots passing.  Take Oxycodone as prescribed. Do not drink alcohol, drive or participate in any other potentially dangerous activities while taking this medication as it may make you sleepy. Do not take this medication with any other sedating medications, either prescription or over-the-counter. If you were prescribed Percocet or Vicodin, do not take these with acetaminophen (Tylenol) as it is already contained within these medications.   This medication is an opiate (or narcotic) pain medication and can be habit forming.  Use it as little as possible to achieve adequate pain control.  Do not use or use it with extreme caution if you have a history of opiate abuse or dependence.  If you are on a pain contract with your primary care doctor or a pain specialist, be sure to let them know you were prescribed this medication today from the [facility].  This medication is intended for your use only - do not give any to anyone else and keep it in a secure place where nobody else, especially children, have access to it.  It will also cause or worsen constipation, so you may want to consider taking an over-the-counter stool softener  while you are taking this medication.

## 2024-02-28 NOTE — ED Provider Notes (Signed)
 [place] EMERGENCY DEPARTMENT AT [facility] Provider Note   CSN: [id number] Arrival date & time: 02/28/24  1221     History  Chief Complaint  Patient presents with   Abdominal Pain    Angel Donovan is a 58 y.o. male.  He presents today because he has had a 1 week history of left-sided abdominal pain that increased to 10/10 pain today with radiation into the left groin.  He states that he had a kidney stone 6 to 8 years ago and this feels very similar to that previous about.  Does add that he did require lithotripsy for that instance.  He endorses pink urine prior to arrival to the hospital today.  He had 1 episode of vomiting in the waiting room while waiting to come back.  Denies any diarrhea, denies any recent changes in his medications, and denies any alcohol consumption in the last 25 years.   Abdominal Pain Associated symptoms: nausea and vomiting   Associated symptoms: no chest pain, no diarrhea, no fatigue and no fever        Home Medications Prior to Admission medications   Medication Sig Start Date End Date Taking? Authorizing Provider  ADVAIR DISKUS 500-50 MCG/ACT AEPB Inhale 1 puff into the lungs daily. 03/30/22   [provider]  albuterol (PROAIR HFA) 108 (90 BASE) MCG/ACT inhaler Inhale 1 puff into the lungs every 6 (six) hours as needed for wheezing or shortness of breath. 11/11/14   Armandina Stammer I, NP  allopurinol (ZYLOPRIM) 100 MG tablet Take 100 mg by mouth at bedtime. 03/04/20   [provider]  amLODipine (NORVASC) 5 MG tablet Take 5 mg by mouth daily. 05/16/18   [provider]  aspirin EC 81 MG tablet Take 81 mg by mouth daily.    [provider]  atorvastatin (LIPITOR) 40 MG tablet Take 40 mg by mouth daily.    [provider]  benazepril (LOTENSIN) 40 MG tablet Take 40 mg by mouth daily. 12/24/17   [provider]  carvedilol (COREG) 25 MG tablet Take 25 mg by mouth 2 (two) times daily with a  meal.    [provider]  chlorthalidone (HYGROTON) 25 MG tablet Take 25 mg by mouth at bedtime. 12/24/17   [provider]  diclofenac Sodium (VOLTAREN) 1 % GEL Apply 2 g topically at bedtime. 05/10/22   [provider]  dicyclomine (BENTYL) 10 MG capsule Take 10 mg by mouth 3 (three) times daily before meals. Prn. 03/12/22   [provider]  doxepin (SINEQUAN) 100 MG capsule Take 100 mg by mouth at bedtime.    [provider]  famotidine (PEPCID) 20 MG tablet Take 20 mg by mouth 2 (two) times daily.    [provider]  FLUoxetine (PROZAC) 20 MG tablet Take 40 mg by mouth daily. 02/16/16   [provider]  furosemide (LASIX) 20 MG tablet Take 10 mg by mouth daily. 05/03/22   [provider]  HYDROcodone-acetaminophen (NORCO/VICODIN) 5-325 MG tablet Take 1-2 tablets by mouth every 4 (four) hours as needed for severe pain. 08/16/23   Roemhildt, Lorin T, PA-C  metFORMIN (GLUCOPHAGE) 500 MG tablet Take 500 mg by mouth daily. 12/24/17   [provider]  nitroGLYCERIN (NITROSTAT) 0.4 MG SL tablet Place 1 tablet (0.4 mg total) under the tongue every 5 (five) minutes as needed for chest pain. 05/22/18   Ivery Quale, PA-C  olmesartan (BENICAR) 40 MG tablet Take 40 mg by mouth  daily. 12/15/21   [provider]  Omega-3 Fatty Acids (OMEGA-3 FISH OIL PO) Take 1 capsule by mouth 3 (three) times daily.    [provider]  polyethylene glycol-electrolytes (TRILYTE) 420 g solution Take 4,000 mLs by mouth as directed. 05/26/22   Dolores Frame, MD  tiZANidine (ZANAFLEX) 4 MG tablet Take 4 mg by mouth at bedtime. 03/04/20   [provider]      Allergies    Patient has no known allergies.    Review of Systems   Review of Systems  Constitutional:  Negative for fatigue and fever.  Cardiovascular:  Negative for chest pain.  Gastrointestinal:  Positive for abdominal pain, nausea and vomiting. Negative for  diarrhea.  Genitourinary:        Pink/red urine  Musculoskeletal:  Negative for arthralgias.    Physical Exam Updated Vital Signs BP (!) 176/104 (BP Location: Right Arm)   Pulse (!) 111   Temp 97.7 F (36.5 C)   Resp (!) 24   SpO2 94%  Physical Exam Vitals and nursing note reviewed.  Constitutional:      General: He is not in acute distress.    Appearance: Normal appearance. He is ill-appearing.  HENT:     Head: Normocephalic and atraumatic.     Mouth/Throat:     Mouth: Mucous membranes are moist.     Pharynx: Oropharynx is clear.  Eyes:     Extraocular Movements: Extraocular movements intact.     Conjunctiva/sclera: Conjunctivae normal.     Pupils: Pupils are equal, round, and reactive to light.  Cardiovascular:     Rate and Rhythm: Normal rate and regular rhythm.     Pulses: Normal pulses.     Heart sounds: Normal heart sounds. No murmur heard.    No friction rub. No gallop.  Pulmonary:     Effort: Pulmonary effort is normal.     Breath sounds: Normal breath sounds.  Abdominal:     General: Abdomen is flat. Bowel sounds are normal.     Palpations: Abdomen is soft.     Tenderness: There is abdominal tenderness in the right upper quadrant. There is left CVA tenderness.  Musculoskeletal:        General: Normal range of motion.     Cervical back: Normal range of motion and neck supple.     Right lower leg: No edema.     Left lower leg: No edema.  Skin:    General: Skin is warm and dry.     Capillary Refill: Capillary refill takes less than 2 seconds.  Neurological:     General: No focal deficit present.     Mental Status: He is alert. Mental status is at baseline.  Psychiatric:        Mood and Affect: Mood normal.     ED Results / Procedures / Treatments   Labs (all labs ordered are listed, but only abnormal results are displayed) Labs Reviewed  LIPASE, BLOOD - Abnormal; Notable for the following components:      Result Value   Lipase 54 (*)    All other  components within normal limits  COMPREHENSIVE METABOLIC PANEL WITH GFR - Abnormal; Notable for the following components:   Glucose, Bld 152 (*)    BUN 22 (*)    All other components within normal limits  CBC - Abnormal; Notable for the following components:   RBC 6.16 (*)    Hemoglobin 18.6 (*)    HCT 57.0 (*)  All other components within normal limits  URINALYSIS, ROUTINE W REFLEX MICROSCOPIC    EKG None  Radiology No results found.  Procedures Procedures    Medications Ordered in ED Medications  ketorolac (TORADOL) 30 MG/ML injection 30 mg (has no administration in time range)    ED Course/ Medical Decision Making/ A&P                                 Medical Decision Making Given prior history and physical exam, suspect renal colic as etiology.  Pending CT for evaluation of renal stone v. Other etiology, managed pain with Ketorolac 30 mg IV, signed out to J. Schuman, PA-C at end of shift.  Amount and/or Complexity of Data Reviewed Labs: ordered.  Risk Prescription drug management.           Final Clinical Impression(s) / ED Diagnoses Final diagnoses:  None    Rx / DC Orders ED Discharge Orders     None         Harold Hedge, PA 02/28/24 1525    Eber Hong, MD 03/01/24 [phone]

## 2024-02-28 NOTE — ED Provider Notes (Signed)
 Patient given in sign out by Gwinda Passe, PA-C.  Please review their note for patient HPI, physical exam, workup.  At this time the plan is follow-up on imaging and pain management as patient was likely has kidney stone.  CT imaging shows small obstructive stone in the left mid ureter with mild hydronephrosis.  Patient was updated of the CT findings and states that his pain is controlled and he would like to be discharged.  Do feel this is reasonable as patient's labs are reassuring and he is not endorsing any infectious symptoms that be suspicious of septic stone or other complications.  Will prescribe oxycodone for pain not controlled by Tylenol or ibuprofen.  PDMP was reviewed.  Will also prescribe Flomax along with Zofran and I spoke to the patient about the side effects of these medications to which she verbalized understanding acceptance of.  I strongly encouraged the patient to remain hydrated and follow-up with urology and to strain his urine in the meantime.  Patient verbalized understanding and acceptance of this plan.  Patient stable to be discharged.  Patient given return precautions.   Angel Corrigan, PA-C 02/28/24 1717    Angel Hong, MD 03/01/24 [phone]

## 2024-02-28 NOTE — ED Triage Notes (Signed)
 Pt here for lower abdominal pain which began yesterday and has been severe today, pt appears uncomfortable in triage and reports that he has voided blood x1

## 2024-03-27 DIAGNOSIS — I10 Essential (primary) hypertension: Secondary | ICD-10-CM | POA: Diagnosis not present

## 2024-03-27 DIAGNOSIS — N201 Calculus of ureter: Secondary | ICD-10-CM | POA: Diagnosis not present

## 2024-03-27 DIAGNOSIS — J40 Bronchitis, not specified as acute or chronic: Secondary | ICD-10-CM | POA: Diagnosis not present

## 2024-05-16 DIAGNOSIS — Z0000 Encounter for general adult medical examination without abnormal findings: Secondary | ICD-10-CM | POA: Diagnosis not present

## 2024-05-16 DIAGNOSIS — I10 Essential (primary) hypertension: Secondary | ICD-10-CM | POA: Diagnosis not present

## 2024-05-16 DIAGNOSIS — I5022 Chronic systolic (congestive) heart failure: Secondary | ICD-10-CM | POA: Diagnosis not present

## 2024-05-16 DIAGNOSIS — M1009 Idiopathic gout, multiple sites: Secondary | ICD-10-CM | POA: Diagnosis not present

## 2024-05-16 DIAGNOSIS — I739 Peripheral vascular disease, unspecified: Secondary | ICD-10-CM | POA: Diagnosis not present

## 2024-05-16 DIAGNOSIS — J4540 Moderate persistent asthma, uncomplicated: Secondary | ICD-10-CM | POA: Diagnosis not present

## 2024-07-02 ENCOUNTER — Encounter (INDEPENDENT_AMBULATORY_CARE_PROVIDER_SITE_OTHER): Payer: Self-pay | Admitting: *Deleted

## 2024-08-21 DIAGNOSIS — B370 Candidal stomatitis: Secondary | ICD-10-CM | POA: Diagnosis not present

## 2024-08-21 DIAGNOSIS — M1009 Idiopathic gout, multiple sites: Secondary | ICD-10-CM | POA: Diagnosis not present

## 2024-08-21 DIAGNOSIS — I10 Essential (primary) hypertension: Secondary | ICD-10-CM | POA: Diagnosis not present

## 2024-08-21 DIAGNOSIS — E1143 Type 2 diabetes mellitus with diabetic autonomic (poly)neuropathy: Secondary | ICD-10-CM | POA: Diagnosis not present

## 2024-08-21 DIAGNOSIS — E7849 Other hyperlipidemia: Secondary | ICD-10-CM | POA: Diagnosis not present

## 2024-08-21 DIAGNOSIS — J4552 Severe persistent asthma with status asthmaticus: Secondary | ICD-10-CM | POA: Diagnosis not present

## 2024-08-21 DIAGNOSIS — K580 Irritable bowel syndrome with diarrhea: Secondary | ICD-10-CM | POA: Diagnosis not present

## 2024-08-21 DIAGNOSIS — B379 Candidiasis, unspecified: Secondary | ICD-10-CM | POA: Diagnosis not present

## 2024-08-21 DIAGNOSIS — I5022 Chronic systolic (congestive) heart failure: Secondary | ICD-10-CM | POA: Diagnosis not present

## 2024-08-21 DIAGNOSIS — I739 Peripheral vascular disease, unspecified: Secondary | ICD-10-CM | POA: Diagnosis not present

## 2024-08-27 NOTE — Progress Notes (Signed)
 Angel Donovan                                          MRN: [id number]   08/27/2024   The [facility] Quality Team Specialist reviewed this patient medical record for the purposes of chart review for care gap closure. The following were reviewed: chart review for care gap closure-kidney health evaluation for diabetes:uACR.    [facility]BCI Quality Team

## 2024-12-25 NOTE — Nursing Note (Signed)
 Admission note: Session conducted virtually using [email or web address]  Tele[facility] protocols Chart reviewed and case reviewed with primary nurse. H&P: Mr. Angel Donovan is a 59 y.o. 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 He mentioned that he had 3 weeks of productive cough preceding his chest pain. Transferred to [facility] CICU for LHC/PCI which he received 12/22/24. Patient is A&OX4.   Recent Vitals BP 137/98   Pulse 115   Temp 36.4 C (97.5 F) (Axillary)   Resp (!) 25   Ht 177.8 cm (5' 10)   Wt 95.7 kg (210 lb 13.9 oz)   SpO2 93%   BMI 30.26 kg/m   Care plan and education assessment initiated.  Falls precautions reviewed  with patient, verbalized understanding, call bell in reach.  Admission completed. All questions answered.

## 2024-12-26 ENCOUNTER — Telehealth: Payer: Self-pay

## 2024-12-26 NOTE — Transitions of Care (Post Inpatient/ED Visit) (Signed)
 "  12/26/2024  Name: Angel Donovan MRN: [id number] DOB: 1965-12-24  Today's TOC FU Call Status: Today's TOC FU Call Status:: Successful TOC FU Call Completed TOC FU Call Complete Date: 12/26/24  Patient's Name and Date of Birth confirmed. DOB, Name  Transition Care Management Follow-up Telephone Call Date of Discharge: 12/25/24 Discharge Facility: Other ([facility]) Name of Other ([facility]) [facility]charge Facility: [facility] Type of Discharge: Inpatient Admission Primary Inpatient Discharge Diagnosis:: [facility] How have you been since you were released from the hospital?: Better Any questions or concerns?: No  Items Reviewed: Did you receive and understand the discharge instructions provided?: Yes Medications obtained,verified, and reconciled?: Yes (Medications Reviewed) Any new allergies since your discharge?: No Dietary orders reviewed?: Yes Type of Diet Ordered:: Low sodium Heart Healthy Do you have support at home?: Yes People in Home [RPT]: friend(s)  Medications Reviewed Today: Medications Reviewed Today     Reviewed by Jahir Halt, RN (Case Manager) on 12/26/24 at 1043  Med List Status: <None>   Medication Order Taking? Sig Documenting Provider Last Dose Status Informant  ADVAIR DISKUS 500-50 MCG/ACT AEPB [id number]  Inhale 1 puff into the lungs daily.  Patient not taking: Reported on 12/26/2024   [provider]  Active Self  albuterol  (PROAIR  HFA) 108 (90 BASE) MCG/ACT inhaler [id number] Yes Inhale 1 puff into the lungs every 6 (six) hours as needed for wheezing or shortness of breath.  Patient taking differently: Inhale 2 puffs into the lungs every 4 (four) hours as needed for wheezing or shortness of breath.   Collene Gouge I, NP  Active Self  allopurinol (ZYLOPRIM) 100 MG tablet [id number]  Take 100 mg by mouth at bedtime. [provider]  Active Self  amLODipine (NORVASC) 5 MG tablet [id number]  Take 5 mg by mouth daily.   Patient not taking: Reported on 12/26/2024   [provider]  Active Self  amoxicillin-clavulanate (AUGMENTIN) 875-125 MG tablet [id number] Yes Take 1 tablet by mouth 2 (two) times daily. [provider]  Active   aspirin  EC 81 MG tablet [id number] Yes Take 81 mg by mouth daily. [provider]  Active Self  atorvastatin  (LIPITOR) 40 MG tablet [id number] Yes Take 40 mg by mouth daily.  Patient taking differently: Take 80 mg by mouth daily.   [provider]  Active Self  benazepril  (LOTENSIN ) 40 MG tablet [id number]  Take 40 mg by mouth daily.  Patient not taking: Reported on 12/26/2024   [provider]  Active Self  carvedilol  (COREG ) 25 MG tablet [id number] Yes Take 25 mg by mouth 2 (two) times daily with a meal.  Patient taking differently: Take 12.5 mg by mouth 2 (two) times daily with a meal.   [provider]  Active Self  cetirizine (ZYRTEC) 10 MG tablet [id number] Yes Take 10 mg by mouth daily. [provider]  Active   chlorthalidone (HYGROTON) 25 MG tablet [id number]  Take 25 mg by mouth at bedtime.  Patient not taking: Reported on 12/26/2024   [provider]  Active Self  diclofenac Sodium (VOLTAREN) 1 % GEL [id number]  Apply 2 g topically at bedtime.  Patient not taking: Reported on 12/26/2024   [provider]  Active Self  dicyclomine (BENTYL) 10 MG capsule [id number]  Take 10 mg by mouth 3 (three) times daily before meals. Prn.  Patient not taking: Reported on 12/26/2024   [provider]  Active Self  doxepin  (SINEQUAN ) 100 MG capsule [id number] Yes  Take 100 mg by mouth at bedtime. [provider]  Active Self  empagliflozin (JARDIANCE) 10 MG TABS tablet [id number] Yes Take 10 mg by mouth daily. [provider]  Active   famotidine  (PEPCID ) 20 MG tablet [id number] Yes Take 20 mg by mouth 2 (two) times daily. [provider]  Active Self  FLUoxetine  (PROZAC ) 20 MG tablet  [id number] Yes Take 40 mg by mouth daily.  Patient taking differently: Take 20 mg by mouth daily.   [provider]  Active Self           Med Note MARYELIZABETH, [facility] T   Thu Feb 23, 2018 10:47 AM)    furosemide  (LASIX ) 20 MG tablet [id number]  Take 10 mg by mouth daily.  Patient not taking: Reported on 12/26/2024   [provider]  Active Self  ipratropium-albuterol  (DUONEB) 0.5-2.5 (3) MG/3ML SOLN [id number] Yes Take 3 mLs by nebulization every 8 (eight) hours as needed. [provider]  Active   losartan (COZAAR) 25 MG tablet [id number] Yes Take 12.5 mg by mouth daily. [provider]  Active   metFORMIN (GLUCOPHAGE) 500 MG tablet [id number] Yes Take 500 mg by mouth daily. [provider]  Active Self  nitroGLYCERIN  (NITROSTAT ) 0.4 MG SL tablet [id number] Yes Place 1 tablet (0.4 mg total) under the tongue every 5 (five) minutes as needed for chest pain. Armida Culver, PA-C  Active Self  olmesartan (BENICAR) 40 MG tablet [id number]  Take 40 mg by mouth daily.  Patient not taking: Reported on 12/26/2024   [provider]  Active Self  Omega-3 Fatty Acids (OMEGA-3 FISH OIL  PO) [id number]  Take 1 capsule by mouth 3 (three) times daily.  Patient not taking: Reported on 12/26/2024   [provider]  Active Self  ondansetron  (ZOFRAN ) 4 MG tablet [id number]  Take 1 tablet (4 mg total) by mouth every 6 (six) hours.  Patient not taking: Reported on 12/26/2024   Schuman, James T, PA-C  Active   polyethylene glycol-electrolytes (TRILYTE ) 420 g solution [id number]  Take 4,000 mLs by mouth as directed.  Patient not taking: Reported on 12/26/2024   Castaneda Mayorga, Daniel, MD  Active Self  spironolactone (ALDACTONE) 25 MG tablet [id number] Yes Take 25 mg by mouth daily. [provider]  Active   tadalafil (CIALIS) 5 MG tablet [id number] Yes Take 5 mg by mouth daily as needed for erectile dysfunction. [provider]  Active   ticagrelor  (BRILINTA) 90 MG TABS tablet [id number] Yes Take by mouth 2 (two) times daily. [provider]  Active   tiZANidine  (ZANAFLEX ) 4 MG tablet [id number] Yes Take 4 mg by mouth at bedtime. [provider]  Active Self  torsemide (DEMADEX) 20 MG tablet [id number] Yes Take 20 mg by mouth daily. [provider]  Active             Home Care and Equipment/Supplies: Were Home Health Services Ordered?: NA Any new equipment or medical supplies ordered?: NA  Functional Questionnaire: Do you need assistance with bathing/showering or dressing?: No Do you need assistance with meal preparation?: No Do you need assistance with eating?: No Do you have difficulty maintaining continence: No Do you need assistance with getting out of bed/getting out of a chair/moving?: No Do you have difficulty managing or taking your medications?: No  Follow up appointments reviewed: PCP Follow-up appointment confirmed?: Yes Date of PCP follow-up appointment?: 12/25/24 Follow-up Provider: Dr. Orpha Driscilla Salvage Follow-up appointment confirmed?: No Reason Specialist Follow-Up Not  Confirmed: Patient has Specialist Provider Number and will Call for Appointment Do you need transportation to your follow-up appointment?: No Do you understand care options if your condition(s) worsen?: Yes-patient verbalized understanding  SDOH Interventions Today    Flowsheet Row Most Recent Value  SDOH Interventions   Food Insecurity Interventions Intervention Not Indicated  Housing Interventions Intervention Not Indicated  Transportation Interventions Intervention Not Indicated  Utilities Interventions Intervention Not Indicated   Discussed and offered 30 day TOC program.  Patient  agreeable.  The patient has been provided with contact information for the care management team and has been advised to call with any health -related questions or concerns.  The patient verbalized understanding with current  plan of care.  The patient is directed to their insurance card regarding availability of benefits coverage.   Righteous Claiborne J. Emanuela Runnion RN, MSN [facility], [facility] RN Care Manager Direct Dial: [phone]  Fax: [phone] Website: [email or web address]   "

## 2024-12-26 NOTE — Patient Instructions (Signed)
 Visit Information  Thank you for taking time to visit with me today. Please don't hesitate to contact me if I can be of assistance to you before our next scheduled telephone appointment.  Our next appointment is by telephone on 01/01/25 at 1000 am  Following is a copy of your care plan:   Goals Addressed             This Visit's Progress    [facility] Transitions of Care (TOC) Care Plan       Problems:  Recent Hospitalization for treatment of NSTEMI Knowledge Deficit Related to NSTEMI   Goal:  Over the next 30 days, the patient will not experience hospital readmission  Interventions:  Transitions of Care: Doctor Visits  - discussed the importance of doctor visits Communication with PCP re: Enrollment in 30 day TOC Post-op wound/incision care reviewed with patient/caregiver Advised patient to: Return immediately (dial 911) if you experience:  Chest pain, pressure, or squeezing--especially radiating to jaw, neck, arm, back, or stomach Shortness of breath, lightheadedness, nausea, sweating associated with chest pain  Call your provider if you notice:  Confusion, reduced urination, increased swelling in feet/ankles New or worsening bleeding or bruising from medication use    Patient Self Care Activities:  Attend all scheduled provider appointments Call pharmacy for medication refills 3-7 days in advance of running out of medications Call provider office for new concerns or questions  Notify RN Care Manager of TOC call rescheduling needs Participate in Transition of Care Program/Attend TOC scheduled calls Take medications as prescribed    Plan:  The patient has been provided with contact information for the care management team and has been advised to call with any health related questions or concerns.         The patient verbalized understanding of instructions, educational materials, and care plan provided today and DECLINED offer to receive copy of patient instructions,  educational materials, and care plan.   The patient has been provided with contact information for the care management team and has been advised to call with any health related questions or concerns.   Please call the care guide team at [phone] if you need to cancel or reschedule your appointment.   Please call the Suicide and Crisis Lifeline: 988 if you are experiencing a Mental Health or Behavioral Health Crisis or need someone to talk to.  Jachob Mcclean J. Cylie Dor RN, MSN [facility], [facility] RN Care Manager Direct Dial: [phone]  Fax: [phone] Website: [email or web address]

## 2025-01-01 ENCOUNTER — Telehealth
